# Patient Record
Sex: Female | Born: 1940 | Race: White | Hispanic: No | Smoking: Former smoker
Health system: Southern US, Community
[De-identification: ages and names within clinical notes are randomized; demographics above are authoritative.]

## PROBLEM LIST (undated history)

## (undated) DIAGNOSIS — M51369 Other intervertebral disc degeneration, lumbar region without mention of lumbar back pain or lower extremity pain: Secondary | ICD-10-CM

## (undated) DIAGNOSIS — N390 Urinary tract infection, site not specified: Secondary | ICD-10-CM

## (undated) DIAGNOSIS — F0280 Dementia in other diseases classified elsewhere without behavioral disturbance: Secondary | ICD-10-CM

## (undated) DIAGNOSIS — I10 Essential (primary) hypertension: Secondary | ICD-10-CM

## (undated) DIAGNOSIS — Z87898 Personal history of other specified conditions: Secondary | ICD-10-CM

## (undated) DIAGNOSIS — E785 Hyperlipidemia, unspecified: Secondary | ICD-10-CM

## (undated) DIAGNOSIS — M5136 Other intervertebral disc degeneration, lumbar region: Secondary | ICD-10-CM

## (undated) DIAGNOSIS — K219 Gastro-esophageal reflux disease without esophagitis: Secondary | ICD-10-CM

## (undated) DIAGNOSIS — U071 COVID-19: Secondary | ICD-10-CM

## (undated) DIAGNOSIS — F32A Depression, unspecified: Secondary | ICD-10-CM

## (undated) DIAGNOSIS — M545 Low back pain, unspecified: Secondary | ICD-10-CM

## (undated) DIAGNOSIS — F419 Anxiety disorder, unspecified: Secondary | ICD-10-CM

## (undated) DIAGNOSIS — I2510 Atherosclerotic heart disease of native coronary artery without angina pectoris: Secondary | ICD-10-CM

## (undated) DIAGNOSIS — F329 Major depressive disorder, single episode, unspecified: Secondary | ICD-10-CM

## (undated) DIAGNOSIS — I200 Unstable angina: Secondary | ICD-10-CM

## (undated) DIAGNOSIS — G8929 Other chronic pain: Secondary | ICD-10-CM

## (undated) DIAGNOSIS — G309 Alzheimer's disease, unspecified: Secondary | ICD-10-CM

## (undated) DIAGNOSIS — J449 Chronic obstructive pulmonary disease, unspecified: Secondary | ICD-10-CM

## (undated) DIAGNOSIS — M1990 Unspecified osteoarthritis, unspecified site: Secondary | ICD-10-CM

## (undated) HISTORY — PX: CATARACT EXTRACTION: SUR2

## (undated) HISTORY — PX: ABDOMINAL HYSTERECTOMY: SHX81

## (undated) HISTORY — PX: OTHER SURGICAL HISTORY: SHX169

## (undated) HISTORY — PX: BACK SURGERY: SHX140

## (undated) HISTORY — PX: CHOLECYSTECTOMY OPEN: SUR202

## (undated) HISTORY — DX: Personal history of other specified conditions: Z87.898

---

## 2004-07-21 ENCOUNTER — Emergency Department (HOSPITAL_COMMUNITY): Admission: EM | Admit: 2004-07-21 | Discharge: 2004-07-22 | Payer: Self-pay | Admitting: Emergency Medicine

## 2004-07-28 ENCOUNTER — Emergency Department (HOSPITAL_COMMUNITY): Admission: EM | Admit: 2004-07-28 | Discharge: 2004-07-28 | Payer: Self-pay | Admitting: Emergency Medicine

## 2004-09-15 ENCOUNTER — Ambulatory Visit (HOSPITAL_COMMUNITY): Admission: RE | Admit: 2004-09-15 | Discharge: 2004-09-15 | Payer: Self-pay | Admitting: Gastroenterology

## 2004-10-13 ENCOUNTER — Ambulatory Visit (HOSPITAL_COMMUNITY): Admission: RE | Admit: 2004-10-13 | Discharge: 2004-10-13 | Payer: Self-pay | Admitting: Gastroenterology

## 2009-06-24 ENCOUNTER — Ambulatory Visit (HOSPITAL_COMMUNITY): Admission: RE | Admit: 2009-06-24 | Discharge: 2009-06-24 | Payer: Self-pay | Admitting: Family Medicine

## 2009-08-02 ENCOUNTER — Emergency Department (HOSPITAL_COMMUNITY): Admission: EM | Admit: 2009-08-02 | Discharge: 2009-08-02 | Payer: Self-pay | Admitting: Emergency Medicine

## 2010-02-16 ENCOUNTER — Emergency Department (HOSPITAL_COMMUNITY): Admission: EM | Admit: 2010-02-16 | Discharge: 2010-02-16 | Payer: Self-pay | Admitting: Emergency Medicine

## 2010-03-02 ENCOUNTER — Ambulatory Visit (HOSPITAL_COMMUNITY): Admission: RE | Admit: 2010-03-02 | Discharge: 2010-03-02 | Payer: Self-pay | Admitting: Orthopaedic Surgery

## 2010-04-11 HISTORY — PX: POSTERIOR LUMBAR FUSION: SHX6036

## 2010-04-23 ENCOUNTER — Encounter: Admission: RE | Admit: 2010-04-23 | Discharge: 2010-04-23 | Payer: Self-pay | Admitting: Neurosurgery

## 2010-05-11 ENCOUNTER — Inpatient Hospital Stay (HOSPITAL_COMMUNITY): Admission: RE | Admit: 2010-05-11 | Discharge: 2010-05-17 | Payer: Self-pay | Admitting: Neurosurgery

## 2010-05-23 ENCOUNTER — Inpatient Hospital Stay (HOSPITAL_COMMUNITY): Admission: EM | Admit: 2010-05-23 | Discharge: 2010-05-26 | Payer: Self-pay | Admitting: Emergency Medicine

## 2010-05-25 ENCOUNTER — Ambulatory Visit: Payer: Self-pay | Admitting: Physical Medicine & Rehabilitation

## 2010-09-01 ENCOUNTER — Inpatient Hospital Stay (HOSPITAL_COMMUNITY): Admission: EM | Admit: 2010-09-01 | Discharge: 2010-09-03 | Payer: Self-pay | Admitting: Emergency Medicine

## 2010-09-01 ENCOUNTER — Ambulatory Visit: Payer: Self-pay | Admitting: Cardiology

## 2010-09-01 ENCOUNTER — Ambulatory Visit: Payer: Self-pay | Admitting: Internal Medicine

## 2010-09-03 ENCOUNTER — Encounter: Payer: Self-pay | Admitting: Internal Medicine

## 2010-09-03 DIAGNOSIS — R55 Syncope and collapse: Secondary | ICD-10-CM | POA: Insufficient documentation

## 2010-09-03 DIAGNOSIS — F329 Major depressive disorder, single episode, unspecified: Secondary | ICD-10-CM | POA: Insufficient documentation

## 2010-09-03 DIAGNOSIS — I10 Essential (primary) hypertension: Secondary | ICD-10-CM | POA: Insufficient documentation

## 2010-09-14 ENCOUNTER — Encounter: Payer: Self-pay | Admitting: Internal Medicine

## 2010-09-14 ENCOUNTER — Ambulatory Visit: Payer: Self-pay | Admitting: Internal Medicine

## 2010-09-14 DIAGNOSIS — M549 Dorsalgia, unspecified: Secondary | ICD-10-CM

## 2010-09-14 DIAGNOSIS — D3500 Benign neoplasm of unspecified adrenal gland: Secondary | ICD-10-CM

## 2010-09-14 DIAGNOSIS — G8929 Other chronic pain: Secondary | ICD-10-CM

## 2010-09-14 DIAGNOSIS — M5137 Other intervertebral disc degeneration, lumbosacral region: Secondary | ICD-10-CM

## 2010-09-30 ENCOUNTER — Encounter: Payer: Self-pay | Admitting: Licensed Clinical Social Worker

## 2010-09-30 ENCOUNTER — Ambulatory Visit: Payer: Self-pay | Admitting: Internal Medicine

## 2010-09-30 DIAGNOSIS — F17200 Nicotine dependence, unspecified, uncomplicated: Secondary | ICD-10-CM
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
## 2011-01-12 NOTE — Assessment & Plan Note (Signed)
Summary: SW/Smoking Cessation  30 minutes.  Smoking Cessation Counseling.   Melissa Baird has been smoking for fifty years and smokes less than a pack of cigarettes each day.  She is interested in quitting because of the smell and that smoking is no longer socially acceptable.   [name] and I discussed the various tips for smoking and discussed medications.  Her depression is a contraindication for Chantix and so I encouraged her to think about using the nicotine patches to help quit.   [name] will keep in touch with me when she is ready to come up with a quit date. She also suffers with high blood pressure, lumbar back issues (recent back surgery), and family problems.

## 2011-01-12 NOTE — Discharge Summary (Signed)
Summary: Hospital Discharge Update    Hospital Discharge Update:  Date of Admission: 09/01/2010 Date of Discharge: 09/03/2010  Brief Summary:  Pt was admitted and evaluated for episodic HTN (with sbp to the 200s), headaches, and dizziness spells Incidental finding of 4x3cm left adrenal adenoma prompted phaeochromocytoma workup. She was discharged in stable condition after 24hr urine was collected with plan to f/u at [facility] clinic w/ Dr. Cena Benton within 1-2wks for hospital f/u and discussion of lab results (as below).  Lab or other results pending at discharge:  Plasma free metanephrines, urine metanephrines and catecholamines  Labs needed at follow-up: Basic metabolic panel  Problem list changes:  Added new problem of HYPERTENSION (ICD-401.9) - Signed Added new problem of DEPRESSION (ICD-311) - Signed Added new problem of Hospitalized for  SYNCOPE (ICD-780.2) - Signed  Medication list changes:  Added new medication of ASPIRIN 81 MG TBEC (ASPIRIN) take by mouth daily - Signed Added new medication of PROZAC 20 MG CAPS (FLUOXETINE HCL) take by mouth daily - Signed Added new medication of DIBENZYLINE 10 MG CAPS (PHENOXYBENZAMINE HCL) take one tab by mouth two times a day - Signed Added new medication of ALENDRONATE SODIUM 70 MG TABS (ALENDRONATE SODIUM) take 1 tab by mouth weekly - Signed Added new medication of NORVASC 10 MG TABS (AMLODIPINE BESYLATE) take one tab by mouth daily - Signed Added new medication of DIOVAN 320 MG TABS (VALSARTAN) take one tab by mouth daily - Signed Added new medication of NAPROXEN 500 MG TABS (NAPROXEN) take one tab by mouth two times a day - Signed  The medication, problem, and allergy lists have been updated.  Please see the dictated discharge summary for details.  Discharge medications:  ASPIRIN 81 MG TBEC (ASPIRIN) take by mouth daily PROZAC 20 MG CAPS (FLUOXETINE HCL) take by mouth daily DIBENZYLINE 10 MG CAPS (PHENOXYBENZAMINE HCL) take one tab by mouth  two times a day ALENDRONATE SODIUM 70 MG TABS (ALENDRONATE SODIUM) take 1 tab by mouth weekly NORVASC 10 MG TABS (AMLODIPINE BESYLATE) take one tab by mouth daily DIOVAN 320 MG TABS (VALSARTAN) take one tab by mouth daily NAPROXEN 500 MG TABS (NAPROXEN) take one tab by mouth two times a day  Other patient instructions:  You can increase your phenoxybenzamine to 20mg  two times a day if you start getting dizzy from your high blood pressue, otherwise come to the ED or call the clinic.

## 2011-01-12 NOTE — Medication Information (Signed)
Summary: CONTROLLED MEDICATION TREATMENT  CONTROLLED MEDICATION TREATMENT   Imported By: Margie Billet 09/23/2010 15:41:30  _____________________________________________________________________  External Attachment:    Type:   Image     Comment:   External Document

## 2011-01-12 NOTE — Miscellaneous (Signed)
Summary: PATIENT CONSENT FORM  PATIENT CONSENT FORM   Imported By: Louretta Parma 09/16/2010 16:05:18  _____________________________________________________________________  External Attachment:    Type:   Image     Comment:   External Document

## 2011-01-12 NOTE — Miscellaneous (Signed)
Summary: ADVANCED HOME HEALTH CERTIFICATION  ADVANCED HOME HEALTH CERTIFICATION   Imported By: Shon Hough 09/17/2010 13:44:24  _____________________________________________________________________  External Attachment:    Type:   Image     Comment:   External Document
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
## 2011-01-19 ENCOUNTER — Other Ambulatory Visit: Payer: Self-pay | Admitting: *Deleted

## 2011-01-20 ENCOUNTER — Other Ambulatory Visit: Payer: Self-pay | Admitting: *Deleted

## 2011-01-21 MED ORDER — HYDROCHLOROTHIAZIDE 12.5 MG PO CAPS: 12.5000 mg | ORAL_CAPSULE | Freq: Every day | ORAL | Status: DC

## 2011-02-24 LAB — CBC
HCT: 44.4 % (ref 36.0–46.0)
Hemoglobin: 15.1 g/dL — ABNORMAL HIGH (ref 12.0–15.0)
Hemoglobin: 16.9 g/dL — ABNORMAL HIGH (ref 12.0–15.0)
MCH: 28.9 pg (ref 26.0–34.0)
RBC: 5.27 MIL/uL — ABNORMAL HIGH (ref 3.87–5.11)
RBC: 5.85 MIL/uL — ABNORMAL HIGH (ref 3.87–5.11)
WBC: 8.5 10*3/uL (ref 4.0–10.5)

## 2011-02-24 LAB — CK TOTAL AND CKMB (NOT AT ARMC)
CK, MB: 2.7 ng/mL (ref 0.3–4.0)
Relative Index: INVALID (ref 0.0–2.5)
Relative Index: INVALID (ref 0.0–2.5)

## 2011-02-24 LAB — URINALYSIS, ROUTINE W REFLEX MICROSCOPIC
Glucose, UA: NEGATIVE mg/dL
Nitrite: NEGATIVE
Specific Gravity, Urine: 1.004 — ABNORMAL LOW (ref 1.005–1.030)
pH: 7.5 (ref 5.0–8.0)

## 2011-02-24 LAB — CATECHOLAMINES, FRACTIONATED, URINE, 24 HOUR
Catecholamines T: 60 mcg/24 h (ref 26–121)
Creatinine, Urine mg/day-CATEUR: 1.04 g/(24.h) (ref 0.63–2.50)
Dopamine 24 Hr Urine: 159 mcg/24 h (ref 52–480)
Norepinephrine 24 Hr Urine: 60 mcg/24 h (ref 15–100)
Total urine volume: 2400 mL

## 2011-02-24 LAB — COMPREHENSIVE METABOLIC PANEL
ALT: 15 U/L (ref 0–35)
AST: 20 U/L (ref 0–37)
Albumin: 4.2 g/dL (ref 3.5–5.2)
CO2: 30 mEq/L (ref 19–32)
Calcium: 10.1 mg/dL (ref 8.4–10.5)
GFR calc Af Amer: >60 mL/min (ref >60)
GFR calc non Af Amer: >60 mL/min (ref >60)
Sodium: 143 mEq/L (ref 135–145)
Total Protein: 7.9 g/dL (ref 6.0–8.3)

## 2011-02-24 LAB — DIFFERENTIAL
Eosinophils Absolute: 0.1 10*3/uL (ref 0.0–0.7)
Eosinophils Relative: 1 % (ref 0–5)
Lymphocytes Relative: 27 % (ref 12–46)
Lymphs Abs: 2.3 10*3/uL (ref 0.7–4.0)
Monocytes Absolute: 0.6 10*3/uL (ref 0.1–1.0)
Monocytes Relative: 7 % (ref 3–12)

## 2011-02-24 LAB — METANEPHRINES, PLASMA: Total Metanephrines-Plasma: 85 pg/mL (ref <=205)

## 2011-02-24 LAB — LIPID PANEL
Cholesterol: 198 mg/dL (ref 0–200)
HDL: 39 mg/dL — ABNORMAL LOW (ref >39)
LDL Cholesterol: 134 mg/dL — ABNORMAL HIGH (ref 0–99)
Total CHOL/HDL Ratio: 5.1 RATIO
Triglycerides: 126 mg/dL (ref <150)
VLDL: 25 mg/dL (ref 0–40)

## 2011-02-24 LAB — MRSA PCR SCREENING: MRSA by PCR: NEGATIVE

## 2011-02-24 LAB — CARDIAC PANEL(CRET KIN+CKTOT+MB+TROPI)
CK, MB: 2.2 ng/mL (ref 0.3–4.0)
Relative Index: INVALID (ref 0.0–2.5)
Total CK: 62 U/L (ref 7–177)
Total CK: 63 U/L (ref 7–177)

## 2011-02-24 LAB — TROPONIN I
Troponin I: 0.01 ng/mL (ref 0.00–0.06)
Troponin I: 0.02 ng/mL (ref 0.00–0.06)

## 2011-02-24 LAB — BASIC METABOLIC PANEL
BUN: 10 mg/dL (ref 6–23)
BUN: 12 mg/dL (ref 6–23)
CO2: 27 mEq/L (ref 19–32)
Calcium: 9.3 mg/dL (ref 8.4–10.5)
Chloride: 101 mEq/L (ref 96–112)
Creatinine, Ser: 0.63 mg/dL (ref 0.4–1.2)
GFR calc Af Amer: >60 mL/min (ref >60)
GFR calc non Af Amer: >60 mL/min (ref >60)
GFR calc non Af Amer: >60 mL/min (ref >60)
Glucose, Bld: 98 mg/dL (ref 70–99)
Potassium: 3.8 mEq/L (ref 3.5–5.1)
Sodium: 140 mEq/L (ref 135–145)

## 2011-02-24 LAB — TSH: TSH: 0.57 u[IU]/mL (ref 0.350–4.500)

## 2011-02-24 LAB — RAPID URINE DRUG SCREEN, HOSP PERFORMED
Barbiturates: NOT DETECTED
Benzodiazepines: NOT DETECTED
Cocaine: NOT DETECTED

## 2011-02-28 LAB — CBC
HCT: 44.7 % (ref 36.0–46.0)
Hemoglobin: 11.8 g/dL — ABNORMAL LOW (ref 12.0–15.0)
MCHC: 34.5 g/dL (ref 30.0–36.0)
MCHC: 34.7 g/dL (ref 30.0–36.0)
MCV: 88 fL (ref 78.0–100.0)
Platelets: 327 10*3/uL (ref 150–400)
Platelets: 240 10*3/uL (ref 150–400)
RDW: 13.5 % (ref 11.5–15.5)
RDW: 13.5 % (ref 11.5–15.5)
WBC: 9.5 10*3/uL (ref 4.0–10.5)

## 2011-02-28 LAB — URINALYSIS, ROUTINE W REFLEX MICROSCOPIC
Ketones, ur: NEGATIVE mg/dL
Nitrite: POSITIVE — AB
Protein, ur: NEGATIVE mg/dL
Urobilinogen, UA: 2 mg/dL — ABNORMAL HIGH (ref 0.0–1.0)

## 2011-02-28 LAB — POCT CARDIAC MARKERS
Myoglobin, poc: 94.7 ng/mL (ref 12–200)
Troponin i, poc: <0.05 ng/mL (ref 0.00–0.09)

## 2011-02-28 LAB — TYPE AND SCREEN: Antibody Screen: NEGATIVE

## 2011-02-28 LAB — BASIC METABOLIC PANEL
BUN: 4 mg/dL — ABNORMAL LOW (ref 6–23)
BUN: 7 mg/dL (ref 6–23)
CO2: 32 mEq/L (ref 19–32)
CO2: 32 mEq/L (ref 19–32)
Calcium: 8.6 mg/dL (ref 8.4–10.5)
Chloride: 105 mEq/L (ref 96–112)
Glucose, Bld: 99 mg/dL (ref 70–99)
Glucose, Bld: 100 mg/dL — ABNORMAL HIGH (ref 70–99)
Potassium: 4.8 mEq/L (ref 3.5–5.1)
Sodium: 141 mEq/L (ref 135–145)

## 2011-02-28 LAB — DIFFERENTIAL
Eosinophils Relative: 2 % (ref 0–5)
Lymphocytes Relative: 27 % (ref 12–46)
Lymphs Abs: 2.7 10*3/uL (ref 0.7–4.0)
Monocytes Relative: 5 % (ref 3–12)
Neutrophils Relative %: 65 % (ref 43–77)

## 2011-04-19 ENCOUNTER — Other Ambulatory Visit: Payer: Self-pay | Admitting: Internal Medicine

## 2011-04-25 ENCOUNTER — Encounter: Payer: Self-pay | Admitting: Internal Medicine

## 2011-06-30 ENCOUNTER — Other Ambulatory Visit (HOSPITAL_COMMUNITY): Payer: Self-pay | Admitting: Family Medicine

## 2011-06-30 ENCOUNTER — Ambulatory Visit (HOSPITAL_COMMUNITY)
Admission: RE | Admit: 2011-06-30 | Discharge: 2011-06-30 | Disposition: A | Discharge status: Home / Self Care | Payer: PRIVATE HEALTH INSURANCE | Source: Ambulatory Visit | Attending: Family Medicine | Admitting: Family Medicine

## 2011-06-30 DIAGNOSIS — R0602 Shortness of breath: Secondary | ICD-10-CM | POA: Insufficient documentation

## 2011-06-30 DIAGNOSIS — I10 Essential (primary) hypertension: Secondary | ICD-10-CM | POA: Insufficient documentation

## 2011-06-30 DIAGNOSIS — T1490XA Injury, unspecified, initial encounter: Secondary | ICD-10-CM

## 2011-06-30 DIAGNOSIS — R0789 Other chest pain: Secondary | ICD-10-CM | POA: Insufficient documentation

## 2011-06-30 DIAGNOSIS — F17200 Nicotine dependence, unspecified, uncomplicated: Secondary | ICD-10-CM | POA: Insufficient documentation

## 2011-08-26 ENCOUNTER — Other Ambulatory Visit: Payer: Self-pay | Admitting: *Deleted

## 2011-08-26 MED ORDER — HYDROCHLOROTHIAZIDE 12.5 MG PO CAPS: 12.5000 mg | ORAL_CAPSULE | Freq: Every day | ORAL | Status: AC

## 2011-08-26 MED ORDER — NEBIVOLOL HCL 10 MG PO TABS: 5.0000 mg | ORAL_TABLET | Freq: Every day | ORAL | Status: DC

## 2011-08-26 NOTE — Telephone Encounter (Signed)
Long overdue for visit and labs.

## 2011-09-05 NOTE — Telephone Encounter (Signed)
Pt needs pcp visit and labs per dr Aundria Rud

## 2011-10-25 ENCOUNTER — Encounter: Payer: Medicare Other | Admitting: Internal Medicine

## 2012-12-20 ENCOUNTER — Ambulatory Visit (HOSPITAL_COMMUNITY)
Admission: RE | Admit: 2012-12-20 | Discharge: 2012-12-20 | Disposition: A | Discharge status: Home / Self Care | Payer: Medicare Other | Source: Ambulatory Visit | Attending: Family Medicine | Admitting: Family Medicine

## 2012-12-20 ENCOUNTER — Other Ambulatory Visit (HOSPITAL_COMMUNITY): Payer: Self-pay | Admitting: Family Medicine

## 2012-12-20 DIAGNOSIS — M545 Low back pain, unspecified: Secondary | ICD-10-CM | POA: Insufficient documentation

## 2012-12-20 DIAGNOSIS — Z981 Arthrodesis status: Secondary | ICD-10-CM

## 2012-12-20 DIAGNOSIS — M5137 Other intervertebral disc degeneration, lumbosacral region: Secondary | ICD-10-CM | POA: Insufficient documentation

## 2012-12-20 DIAGNOSIS — M51379 Other intervertebral disc degeneration, lumbosacral region without mention of lumbar back pain or lower extremity pain: Secondary | ICD-10-CM | POA: Insufficient documentation

## 2013-02-14 ENCOUNTER — Ambulatory Visit (HOSPITAL_COMMUNITY)
Admission: RE | Admit: 2013-02-14 | Discharge: 2013-02-14 | Disposition: A | Discharge status: Home / Self Care | Payer: Medicare Other | Source: Ambulatory Visit | Attending: Family Medicine | Admitting: Family Medicine

## 2013-02-14 ENCOUNTER — Other Ambulatory Visit (HOSPITAL_COMMUNITY): Payer: Self-pay | Admitting: Family Medicine

## 2013-02-14 DIAGNOSIS — M1610 Unilateral primary osteoarthritis, unspecified hip: Secondary | ICD-10-CM | POA: Insufficient documentation

## 2013-02-14 DIAGNOSIS — M25559 Pain in unspecified hip: Secondary | ICD-10-CM | POA: Insufficient documentation

## 2013-02-14 DIAGNOSIS — M1990 Unspecified osteoarthritis, unspecified site: Secondary | ICD-10-CM

## 2013-06-17 ENCOUNTER — Encounter (HOSPITAL_COMMUNITY): Payer: Self-pay | Admitting: *Deleted

## 2013-06-17 ENCOUNTER — Encounter (HOSPITAL_COMMUNITY)
Admission: EM | Disposition: A | Discharge status: Home / Self Care | Payer: Self-pay | Attending: Cardiovascular Disease

## 2013-06-17 ENCOUNTER — Emergency Department (HOSPITAL_COMMUNITY): Payer: Medicare Other

## 2013-06-17 ENCOUNTER — Inpatient Hospital Stay (HOSPITAL_COMMUNITY)
Admission: EM | Admit: 2013-06-17 | Discharge: 2013-06-19 | DRG: 287 | Disposition: A | Discharge status: Home / Self Care | Payer: Medicare Other | Source: Other Acute Inpatient Hospital | Attending: Cardiovascular Disease | Admitting: Cardiovascular Disease

## 2013-06-17 ENCOUNTER — Other Ambulatory Visit: Payer: Self-pay

## 2013-06-17 DIAGNOSIS — F3289 Other specified depressive episodes: Secondary | ICD-10-CM | POA: Diagnosis present

## 2013-06-17 DIAGNOSIS — I10 Essential (primary) hypertension: Secondary | ICD-10-CM | POA: Diagnosis present

## 2013-06-17 DIAGNOSIS — R55 Syncope and collapse: Secondary | ICD-10-CM | POA: Diagnosis present

## 2013-06-17 DIAGNOSIS — G8929 Other chronic pain: Secondary | ICD-10-CM | POA: Diagnosis present

## 2013-06-17 DIAGNOSIS — F17200 Nicotine dependence, unspecified, uncomplicated: Secondary | ICD-10-CM | POA: Diagnosis present

## 2013-06-17 DIAGNOSIS — I2510 Atherosclerotic heart disease of native coronary artery without angina pectoris: Principal | ICD-10-CM | POA: Diagnosis present

## 2013-06-17 DIAGNOSIS — F32A Depression, unspecified: Secondary | ICD-10-CM | POA: Diagnosis present

## 2013-06-17 DIAGNOSIS — E785 Hyperlipidemia, unspecified: Secondary | ICD-10-CM | POA: Diagnosis present

## 2013-06-17 DIAGNOSIS — I200 Unstable angina: Secondary | ICD-10-CM | POA: Diagnosis present

## 2013-06-17 DIAGNOSIS — F329 Major depressive disorder, single episode, unspecified: Secondary | ICD-10-CM | POA: Diagnosis present

## 2013-06-17 DIAGNOSIS — IMO0002 Reserved for concepts with insufficient information to code with codable children: Secondary | ICD-10-CM | POA: Diagnosis present

## 2013-06-17 LAB — CBC WITH DIFFERENTIAL/PLATELET
Eosinophils Relative: 1 % (ref 0–5)
Hemoglobin: 15.2 g/dL — ABNORMAL HIGH (ref 12.0–15.0)
Lymphocytes Relative: 20 % (ref 12–46)
Lymphs Abs: 3.1 10*3/uL (ref 0.7–4.0)
MCV: 84.6 fL (ref 78.0–100.0)
Platelets: 282 10*3/uL (ref 150–400)
RBC: 5.19 MIL/uL — ABNORMAL HIGH (ref 3.87–5.11)
WBC: 15.5 10*3/uL — ABNORMAL HIGH (ref 4.0–10.5)

## 2013-06-17 LAB — CBC
MCH: 28.5 pg (ref 26.0–34.0)
MCHC: 33.9 g/dL (ref 30.0–36.0)
Platelets: 279 10*3/uL (ref 150–400)
RBC: 4.78 MIL/uL (ref 3.87–5.11)

## 2013-06-17 LAB — POCT I-STAT, CHEM 8
Calcium, Ion: 1.19 mmol/L (ref 1.13–1.30)
HCT: 48 % — ABNORMAL HIGH (ref 36.0–46.0)
Hemoglobin: 16.3 g/dL — ABNORMAL HIGH (ref 12.0–15.0)
Sodium: 141 mEq/L (ref 135–145)
TCO2: 25 mmol/L (ref 0–100)

## 2013-06-17 LAB — POCT I-STAT TROPONIN I: Troponin i, poc: 0.01 ng/mL (ref 0.00–0.08)

## 2013-06-17 LAB — TROPONIN I: Troponin I: <0.3 ng/mL (ref <0.30)

## 2013-06-17 LAB — CREATININE, SERUM: Creatinine, Ser: 0.77 mg/dL (ref 0.50–1.10)

## 2013-06-17 SURGERY — LEFT HEART CATHETERIZATION WITH CORONARY ANGIOGRAM
Anesthesia: LOCAL

## 2013-06-17 MED ORDER — AMLODIPINE BESYLATE 10 MG PO TABS
10.0000 mg | ORAL_TABLET | Freq: Every day | ORAL | Status: DC
  Administered 2013-06-19: 10 mg via ORAL
  Filled 2013-06-17 (×2): qty 1

## 2013-06-17 MED ORDER — ASPIRIN 300 MG RE SUPP
300.0000 mg | RECTAL | Status: DC
  Filled 2013-06-17: qty 1

## 2013-06-17 MED ORDER — ALPRAZOLAM 0.25 MG PO TABS: 0.2500 mg | ORAL_TABLET | Freq: Two times a day (BID) | ORAL | Status: DC | PRN

## 2013-06-17 MED ORDER — IRBESARTAN 300 MG PO TABS
300.0000 mg | ORAL_TABLET | Freq: Every day | ORAL | Status: DC
  Filled 2013-06-17 (×2): qty 1

## 2013-06-17 MED ORDER — NITROGLYCERIN IN D5W 200-5 MCG/ML-% IV SOLN
2.0000 ug/min | INTRAVENOUS | Status: DC
  Administered 2013-06-17: 2 ug/min via INTRAVENOUS
  Filled 2013-06-17: qty 250

## 2013-06-17 MED ORDER — HEPARIN (PORCINE) IN NACL 100-0.45 UNIT/ML-% IJ SOLN
950.0000 [IU]/h | INTRAMUSCULAR | Status: DC
  Administered 2013-06-17: 950 [IU]/h via INTRAVENOUS
  Filled 2013-06-17 (×2): qty 250

## 2013-06-17 MED ORDER — OXYCODONE-ACETAMINOPHEN 7.5-500 MG PO TABS: 1.0000 | ORAL_TABLET | Freq: Three times a day (TID) | ORAL | Status: DC | PRN

## 2013-06-17 MED ORDER — ACETAMINOPHEN 325 MG PO TABS: 650.0000 mg | ORAL_TABLET | ORAL | Status: DC | PRN

## 2013-06-17 MED ORDER — ONDANSETRON HCL 4 MG/2ML IJ SOLN: 4.0000 mg | Freq: Four times a day (QID) | INTRAMUSCULAR | Status: DC | PRN

## 2013-06-17 MED ORDER — ASPIRIN EC 81 MG PO TBEC
81.0000 mg | DELAYED_RELEASE_TABLET | Freq: Every day | ORAL | Status: DC
  Administered 2013-06-19: 81 mg via ORAL
  Filled 2013-06-17 (×2): qty 1

## 2013-06-17 MED ORDER — ZOLPIDEM TARTRATE 5 MG PO TABS: 5.0000 mg | ORAL_TABLET | Freq: Every evening | ORAL | Status: DC | PRN

## 2013-06-17 MED ORDER — HEPARIN SODIUM (PORCINE) 5000 UNIT/ML IJ SOLN: 5000.0000 [IU] | Freq: Three times a day (TID) | INTRAMUSCULAR | Status: DC

## 2013-06-17 MED ORDER — OXYCODONE HCL 5 MG PO TABS: 2.5000 mg | ORAL_TABLET | Freq: Three times a day (TID) | ORAL | Status: DC | PRN

## 2013-06-17 MED ORDER — NITROGLYCERIN 0.4 MG SL SUBL: 0.4000 mg | SUBLINGUAL_TABLET | SUBLINGUAL | Status: DC | PRN

## 2013-06-17 MED ORDER — HEPARIN BOLUS VIA INFUSION
3500.0000 [IU] | Freq: Once | INTRAVENOUS | Status: AC
  Administered 2013-06-17: 3500 [IU] via INTRAVENOUS

## 2013-06-17 MED ORDER — OXYCODONE-ACETAMINOPHEN 5-325 MG PO TABS: 1.0000 | ORAL_TABLET | Freq: Three times a day (TID) | ORAL | Status: DC | PRN

## 2013-06-17 MED ORDER — ASPIRIN 81 MG PO CHEW: 324.0000 mg | CHEWABLE_TABLET | ORAL | Status: DC

## 2013-06-17 MED ORDER — PANTOPRAZOLE SODIUM 40 MG PO TBEC
40.0000 mg | DELAYED_RELEASE_TABLET | Freq: Every day | ORAL | Status: DC
  Administered 2013-06-19: 40 mg via ORAL
  Filled 2013-06-17: qty 1
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
## 2013-06-17 NOTE — Progress Notes (Signed)
ANTICOAGULATION CONSULT NOTE - Initial Consult  Pharmacy Consult for UFH Indication: USAP  No Known Allergies  Patient Measurements: Height: 5' 5.35" (166 cm) Weight: 148 lb 5.9 oz (67.3 kg) IBW/kg (Calculated) : 57.81 Heparin Dosing Weight: 67kg  Vital Signs: Temp: 97.6 F (36.4 C) (07/07 1517) Temp src: Oral (07/07 1517) BP: 156/77 mmHg (07/07 1830) Pulse Rate: 82 (07/07 1830)  Labs:  Recent Labs  06/17/13 1516 06/17/13 1533  HGB 15.2* 16.3*  HCT 43.9 48.0*  PLT 282  --   CREATININE  --  1.20*    Estimated Creatinine Clearance: 39.8 ml/min (by C-G formula based on Cr of 1.2).   Medical History: Past Medical History  Diagnosis Date  . Hypertension     Medications:   (Not in a hospital admission)  Assessment: 72 y/o female patient admitted with chest pain requiring anticoagulation for r/o MI. EKG wnl, and cardiac enzymes negative x1.  Goal of Therapy:  Heparin level 0.3-0.7 units/ml Monitor platelets by anticoagulation protocol: Yes   Plan:  Heparin 3500 unit IV bolus followed by infusion at 950 units/hr. Check 8 hour heparin level with daily cbc and heparin level.  Verlene Mayer, PharmD, BCPS Pager [phone]   Dickey Gave 06/17/2013,7:01 PM

## 2013-06-17 NOTE — ED Notes (Signed)
Patient states chest pain x approx 1 week, patient states episodes of diaphoresis and nausea associated.
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
## 2013-06-18 ENCOUNTER — Encounter (HOSPITAL_COMMUNITY)
Admission: EM | Disposition: A | Discharge status: Home / Self Care | Payer: Self-pay | Attending: Cardiovascular Disease

## 2013-06-18 DIAGNOSIS — I2510 Atherosclerotic heart disease of native coronary artery without angina pectoris: Secondary | ICD-10-CM

## 2013-06-18 HISTORY — PX: LEFT HEART CATHETERIZATION WITH CORONARY ANGIOGRAM: SHX5451

## 2013-06-18 LAB — CBC
MCV: 84.9 fL (ref 78.0–100.0)
Platelets: 257 10*3/uL (ref 150–400)
RDW: 14.5 % (ref 11.5–15.5)
WBC: 9.5 10*3/uL (ref 4.0–10.5)

## 2013-06-18 LAB — BASIC METABOLIC PANEL
BUN: 21 mg/dL (ref 6–23)
Calcium: 8.7 mg/dL (ref 8.4–10.5)
GFR calc Af Amer: >90 mL/min (ref >90)
GFR calc non Af Amer: 85 mL/min — ABNORMAL LOW (ref >90)
Glucose, Bld: 107 mg/dL — ABNORMAL HIGH (ref 70–99)
Sodium: 140 mEq/L (ref 135–145)

## 2013-06-18 LAB — LIPID PANEL
Total CHOL/HDL Ratio: 3.5 RATIO
VLDL: 28 mg/dL (ref 0–40)

## 2013-06-18 LAB — POCT ACTIVATED CLOTTING TIME: Activated Clotting Time: 104 seconds

## 2013-06-18 SURGERY — LEFT HEART CATHETERIZATION WITH CORONARY ANGIOGRAM
Anesthesia: LOCAL

## 2013-06-18 MED ORDER — SODIUM CHLORIDE 0.9 % IJ SOLN: 3.0000 mL | INTRAMUSCULAR | Status: DC | PRN

## 2013-06-18 MED ORDER — SODIUM CHLORIDE 0.9 % IJ SOLN: 3.0000 mL | Freq: Two times a day (BID) | INTRAMUSCULAR | Status: DC

## 2013-06-18 MED ORDER — LIDOCAINE HCL (PF) 1 % IJ SOLN
INTRAMUSCULAR | Status: AC
  Filled 2013-06-18: qty 30

## 2013-06-18 MED ORDER — FENTANYL CITRATE 0.05 MG/ML IJ SOLN
INTRAMUSCULAR | Status: AC
  Filled 2013-06-18: qty 2

## 2013-06-18 MED ORDER — MEMANTINE HCL 10 MG PO TABS
10.0000 mg | ORAL_TABLET | Freq: Two times a day (BID) | ORAL | Status: DC
  Administered 2013-06-18 – 2013-06-19 (×3): 10 mg via ORAL
  Filled 2013-06-18 (×4): qty 1

## 2013-06-18 MED ORDER — SODIUM CHLORIDE 0.9 % IV SOLN: INTRAVENOUS | Status: DC

## 2013-06-18 MED ORDER — SODIUM CHLORIDE 0.9 % IV SOLN: 250.0000 mL | INTRAVENOUS | Status: DC | PRN

## 2013-06-18 MED ORDER — ONDANSETRON HCL 4 MG/2ML IJ SOLN: 4.0000 mg | Freq: Four times a day (QID) | INTRAMUSCULAR | Status: DC | PRN

## 2013-06-18 MED ORDER — ACETAMINOPHEN 325 MG PO TABS: 650.0000 mg | ORAL_TABLET | ORAL | Status: DC | PRN

## 2013-06-18 MED ORDER — SODIUM CHLORIDE 0.9 % IV SOLN
INTRAVENOUS | Status: AC
  Administered 2013-06-18: 10:00:00 via INTRAVENOUS

## 2013-06-18 MED ORDER — LISINOPRIL 40 MG PO TABS
40.0000 mg | ORAL_TABLET | Freq: Every day | ORAL | Status: DC
  Administered 2013-06-18 – 2013-06-19 (×2): 40 mg via ORAL
  Filled 2013-06-18 (×2): qty 1

## 2013-06-18 MED ORDER — HEPARIN (PORCINE) IN NACL 2-0.9 UNIT/ML-% IJ SOLN
INTRAMUSCULAR | Status: AC
  Filled 2013-06-18: qty 1000

## 2013-06-18 MED ORDER — SODIUM CHLORIDE 0.9 % IV SOLN: 1.0000 mL/kg/h | INTRAVENOUS | Status: DC

## 2013-06-18 MED ORDER — ASPIRIN 81 MG PO CHEW: 81.0000 mg | CHEWABLE_TABLET | Freq: Every day | ORAL | Status: DC

## 2013-06-18 MED ORDER — MIDAZOLAM HCL 2 MG/2ML IJ SOLN
INTRAMUSCULAR | Status: AC
  Filled 2013-06-18: qty 2

## 2013-06-18 MED ORDER — METOPROLOL TARTRATE 25 MG PO TABS
25.0000 mg | ORAL_TABLET | Freq: Two times a day (BID) | ORAL | Status: DC
  Administered 2013-06-18 – 2013-06-19 (×3): 25 mg via ORAL
  Filled 2013-06-18 (×5): qty 1

## 2013-06-18 MED ORDER — ASPIRIN 81 MG PO CHEW
324.0000 mg | CHEWABLE_TABLET | ORAL | Status: AC
  Administered 2013-06-18: 324 mg via ORAL

## 2013-06-18 MED ORDER — VERAPAMIL HCL 2.5 MG/ML IV SOLN
INTRAVENOUS | Status: AC
  Filled 2013-06-18: qty 2

## 2013-06-18 MED ORDER — NITROGLYCERIN 0.2 MG/ML ON CALL CATH LAB
INTRAVENOUS | Status: AC
  Filled 2013-06-18: qty 1

## 2013-06-18 MED ORDER — ASPIRIN 81 MG PO CHEW
324.0000 mg | CHEWABLE_TABLET | ORAL | Status: DC
  Filled 2013-06-18: qty 4

## 2013-06-18 MED ORDER — BUPROPION HCL ER (XL) 300 MG PO TB24
300.0000 mg | ORAL_TABLET | ORAL | Status: DC
  Administered 2013-06-18 – 2013-06-19 (×2): 300 mg via ORAL
  Filled 2013-06-18 (×4): qty 1

## 2013-06-18 NOTE — CV Procedure (Signed)
Melissa Baird is a 72 y.o. female    [id number] LOCATION:  FACILITY: [facility]  PHYSICIAN: Nanetta Batty, M.D. 11-09-1941   DATE OF PROCEDURE:  06/18/2013  DATE OF DISCHARGE:   CARDIAC CATHETERIZATION     History obtained from chart review.The patient is a 72 y/o Caucasian female with a history of HTN, HLD, and long standing history of tobacco abuse, who was transported urgently to [facility], via EMS, with a complaint of progressively worsening chest pain. The pain started roughly 7 days ago. It is substernal, mostly exertional and characterized as pressure-like pain. Today, she had radiation to the right upper jaw. No arm or back pain. No SOB. Today she developed severe 10/10 resting pain with associated nausea and profuse diaphoresis. She felt lightheaded and had a witnessed syncopal episode. She was unresponsive for 5-10 seconds. This was witnessed by her daughter. The patient was sitting in a chair and did not fall. At that time EMS was called. On arrival, she was given ASA and SL NGT, with relived the pain. She is currently chest pain free. She ruled out for myocardial infarction by enzymes. She remained pain-free on IV heparin and nitroglycerin. She presents now for cardiac catheterization to define her anatomy and rule out an ischemic etiology.    PROCEDURE DESCRIPTION:    The patient was brought to the second floor [facility] cath lab in the postabsorptive state. She was premedicated with Valium 5 mg by mouth, IV Versed and fentanyl. Her right wrist and groinWere prepped and shaved in usual sterile fashion. Xylocaine 1% was used for local anesthesia. A 6 French sheath was inserted into the right radial artery using standard Seldinger technique. Attempts were made to advance the wire and catheter of the radial artery however there was diffuse spasm in this artery making it impossible to advance the wire and catheter and therefore the right femoral artery was accessed using a 5 French  sheath. 5 French right and left Judkins diagnostic catheters and 5 French pigtail catheter was used for selective coronary angiography and left ventriculography respectively. Visipaque dye was used for the entirety of the case. Retrograde aorta, left ventricular end pullback pressures were recorded.   HEMODYNAMICS:    AO SYSTOLIC/AO DIASTOLIC: 152/65   LV SYSTOLIC/LV DIASTOLIC: 146/17  ANGIOGRAPHIC RESULTS:   1. Left main; normal  2. LAD; 40-50% segmental mid 3. Left circumflex; 40-50% AV groove circumflex proximal and 50% mid.  4. Right coronary artery; 50% segmental mid, 56% fairly focal distal 5. Left ventriculography; RAO left ventriculogram was performed using 25 cc of Visipaque dye at 12 cc per second. 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 She'll need cardiac risk factor modification and medical therapy. The radial sheath was removed and a TR band was placed on the right wrist to achieve hemostasis. The femoral sheath was removed and pressure was held on the right groin to achieve hemostasis. The patient will be hydrated and discharged home in the morning.  Runell Gess MD, [facility] 06/18/2013 9:49 AM

## 2013-06-18 NOTE — Plan of Care (Signed)
Problem: Consults Goal: Cardiac Cath Patient Education (See Patient Education module for education specifics.)  Outcome: Completed/Met Date Met:  06/18/13 Pt states she has had procedure in the past and understands. Refuses to watch video

## 2013-06-18 NOTE — Progress Notes (Signed)
ANTICOAGULATION CONSULT NOTE - Follow Up Consult  Pharmacy Consult for heparin Indication: USAP  Labs:  Recent Labs  06/17/13 1516 06/17/13 1533 06/17/13 2055 06/17/13 2107 06/18/13 0430  HGB 15.2* 16.3*  --  13.6 12.5  HCT 43.9 48.0*  --  40.1 37.7  PLT 282  --   --  279 257  HEPARINUNFRC  --   --   --   --  0.38  CREATININE  --  1.20*  --  0.77  --   TROPONINI  --   --  <0.30  --  <0.30    Assessment/Plan:  72yo female therapeutic on heparin with initial dosing for USAP.  Will continue gtt at current rate and confirm stable with next CE.  Vernard Gambles, PharmD, BCPS  06/18/2013,5:41 AM

## 2013-06-18 NOTE — Care Management Note (Signed)
    Page 1 of 1   06/18/2013     9:39:16 AM   CARE MANAGEMENT NOTE 06/18/2013  Patient:  Melissa Baird, Melissa Baird   Account Number:  [id number]  Date Initiated:  06/18/2013  Documentation initiated by:  Junius Creamer  Subjective/Objective Assessment:   adm w angina     Action/Plan:   lives w fam, pcp dr Gerlene Burdock [facility]   Anticipated DC Date:     Anticipated DC Plan:        DC Planning Services  CM consult      Choice offered to / List presented to:             Status of service:   Medicare Important Message given?   (If response is "NO", the following Medicare IM given date fields will be blank) Date Medicare IM given:   Date Additional Medicare IM given:    Discharge Disposition:    Per UR Regulation:  Reviewed for med. necessity/level of care/duration of stay  If discussed at Long Length of Stay Meetings, dates discussed:    Comments:

## 2013-06-18 NOTE — Progress Notes (Signed)
[facility]  Subjective: No further chest pain.   Objective: Vital signs in last 24 hours: Temp:  [97.6 F (36.4 C)-98.5 F (36.9 C)] 98.5 F (36.9 C) (07/08 0344) Pulse Rate:  [63-89] 82 (07/07 1915) Resp:  [14-25] 14 (07/08 0400) BP: (103-187)/(45-90) 155/59 mmHg (07/08 0400) SpO2:  [94 %-100 %] 96 % (07/08 0400) Weight:  [148 lb 5.9 oz (67.3 kg)-153 lb (69.4 kg)] 153 lb (69.4 kg) (07/08 0549)    Intake/Output from previous day: 07/07 0701 - 07/08 0700 In: 509.2 [P.O.:300; I.V.:209.2] Out: -  Intake/Output this shift:    Medications Current Facility-Administered Medications  Medication Dose Route Frequency Provider Last Rate Last Dose  . [START ON 06/19/2013] 0.9 %  sodium chloride infusion   Intravenous Continuous Brittainy Simmons, PA-C      . 0.9 %  sodium chloride infusion  250 mL Intravenous PRN Runell Gess, MD      . 0.9 %  sodium chloride infusion  1 mL/kg/hr Intravenous Continuous Runell Gess, MD 67.3 mL/hr at 06/18/13 0600 1 mL/kg/hr at 06/18/13 0600  . acetaminophen (TYLENOL) tablet 650 mg  650 mg Oral Q4H PRN Brittainy Simmons, PA-C      . ALPRAZolam (XANAX) tablet 0.25 mg  0.25 mg Oral BID PRN Brittainy Simmons, PA-C      . amLODipine (NORVASC) tablet 10 mg  10 mg Oral Daily Brittainy Simmons, PA-C      . [START ON 06/19/2013] aspirin chewable tablet 324 mg  324 mg Oral Pre-Cath Brittainy Simmons, PA-C      . aspirin EC tablet 81 mg  81 mg Oral Daily Brittainy Simmons, PA-C      . heparin ADULT infusion 100 units/mL (25000 units/250 mL)  950 Units/hr Intravenous Continuous Doug Sou, MD 9.5 mL/hr at 06/17/13 2015 950 Units/hr at 06/17/13 2015  . irbesartan (AVAPRO) tablet 300 mg  300 mg Oral Daily Brittainy Simmons, PA-C      . nitroGLYCERIN (NITROSTAT) SL tablet 0.4 mg  0.4 mg Sublingual Q5 Min x 3 PRN Brittainy Simmons, PA-C      . nitroGLYCERIN 0.2 mg/mL in dextrose 5 % infusion  2-200 mcg/min Intravenous Titrated  Brittainy Simmons, PA-C 1.5 mL/hr at 06/17/13 2030 5 mcg/min at 06/17/13 2030  . ondansetron (ZOFRAN) injection 4 mg  4 mg Intravenous Q6H PRN Brittainy Simmons, PA-C      . oxyCODONE-acetaminophen (PERCOCET/ROXICET) 5-325 MG per tablet 1 tablet  1 tablet Oral Q8H PRN Runell Gess, MD       And  . oxyCODONE (Oxy IR/ROXICODONE) immediate release tablet 2.5 mg  2.5 mg Oral Q8H PRN Runell Gess, MD      . pantoprazole (PROTONIX) EC tablet 40 mg  40 mg Oral Daily Brittainy Simmons, PA-C      . sodium chloride 0.9 % injection 3 mL  3 mL Intravenous Q12H Runell Gess, MD      . sodium chloride 0.9 % injection 3 mL  3 mL Intravenous PRN Runell Gess, MD      . zolpidem (AMBIEN) tablet 5 mg  5 mg Oral QHS PRN Robbie Lis, PA-C        PE: General appearance: alert, cooperative and no distress Lungs: clear to auscultation bilaterally Heart: regular rate and rhythm Extremities: no LEE Pulses: 2+ and symmetric Skin: warm and dry Neurologic: Grossly normal  Lab Results:   Recent Labs  06/17/13 1516 06/17/13 1533 06/17/13 2107 06/18/13 0430  WBC 15.5*  --  12.4* 9.5  HGB 15.2* 16.3* 13.6 12.5  HCT 43.9 48.0* 40.1 37.7  PLT 282  --  279 257   BMET  Recent Labs  06/17/13 1533 06/17/13 2107 06/18/13 0430  NA 141  --  140  K 4.3  --  3.8  CL 105  --  107  CO2  --   --  24  GLUCOSE 112*  --  107*  BUN 23  --  21  CREATININE 1.20* 0.77 0.72  CALCIUM  --   --  8.7   PT/INR  Recent Labs  06/18/13 0430  LABPROT 14.0  INR 1.10   Cholesterol  Recent Labs  06/18/13 0430  CHOL 123   Cardiac Panel (last 3 results)  Recent Labs  06/17/13 2055 06/18/13 0430  TROPONINI <0.30 <0.30   Lipid Panel     Component Value Date/Time   CHOL 123 06/18/2013 0430   TRIG 141 06/18/2013 0430   HDL 35* 06/18/2013 0430   CHOLHDL 3.5 06/18/2013 0430   VLDL 28 06/18/2013 0430   LDLCALC 60 06/18/2013 0430     Assessment/Plan  Principal Problem:   Unstable angina Active  Problems:   TOBACCO ABUSE   HYPERTENSION   SYNCOPE   HLD (hyperlipidemia)  Plan: Enzymes negative x 2. Today's EKG is unchanged from initial EKG. No further chest pain. On IV heparin and IV nitro. HR is stable. Moderate hypertension. Currently on amlodipine and an ARB. Hold ARB in anticipation of cath today. ? Adding a low does BB. INR is WNL. Renal function is normal with a SCr of 0.72. She has been NPO since midnight. Plan for diagnostic LHC +/- PCI with Dr. Allyson Sabal today. Pt also has  2 blisters on the dorsal surface of left foot. The overlying skin is still intact. Will place a nursing wound care order to check daily and to provide appropriate care to prevent infection.     LOS: 1 day    Brittainy M. Delmer Islam 06/18/2013 7:53 AM  Agree with note written by Boyce Medici  PAC  No further CP. Enz neg. On IV hep/NTG. For diagnostic cath today.   Runell Gess 06/18/2013 8:56 AM

## 2013-06-18 NOTE — H&P (Signed)
    Pt was reexamined and existing H & P reviewed. No changes found.  Runell Gess, MD [facility] 06/18/2013 8:55 AM

## 2013-06-18 NOTE — Progress Notes (Signed)
Nutrition Brief Note  Patient identified on the Malnutrition Screening Tool (MST) Report  Body mass index is 25.19 kg/(m^2). Patient meets criteria for overweight based on current BMI.   Current diet order is Heart Healthy, patient is consuming approximately 75% of meals at this time per her report. Labs and medications reviewed.   Reviewed wt hx with pt who reports her usual wt is 150-160 lbs.  Pt reports her appetite is sometimes variable, however she acknowledges adequate intake. No nutrition interventions warranted at this time. If nutrition issues arise, please consult RD.   Loyce Dys, MS RD LDN Clinical Inpatient Dietitian Pager: [phone] Weekend/After hours pager: [phone]

## 2013-06-19 LAB — BASIC METABOLIC PANEL
BUN: 14 mg/dL (ref 6–23)
Calcium: 9.2 mg/dL (ref 8.4–10.5)
Creatinine, Ser: 0.76 mg/dL (ref 0.50–1.10)
GFR calc Af Amer: >90 mL/min (ref >90)
GFR calc non Af Amer: 83 mL/min — ABNORMAL LOW (ref >90)
Potassium: 3.8 mEq/L (ref 3.5–5.1)

## 2013-06-19 LAB — CBC
Hemoglobin: 13.1 g/dL (ref 12.0–15.0)
MCHC: 34.1 g/dL (ref 30.0–36.0)
Platelets: 248 10*3/uL (ref 150–400)
RDW: 14.3 % (ref 11.5–15.5)

## 2013-06-19 MED ORDER — ASPIRIN 81 MG PO CHEW: 81.0000 mg | CHEWABLE_TABLET | Freq: Every day | ORAL | Status: DC

## 2013-06-19 MED ORDER — METOPROLOL TARTRATE 50 MG PO TABS
50.0000 mg | ORAL_TABLET | Freq: Two times a day (BID) | ORAL | Status: DC
Start: 2013-06-19 — End: 2013-06-19
  Filled 2013-06-19: qty 1

## 2013-06-19 MED ORDER — NITROGLYCERIN 0.4 MG SL SUBL: 0.4000 mg | SUBLINGUAL_TABLET | SUBLINGUAL | Status: DC | PRN

## 2013-06-19 MED ORDER — METOPROLOL TARTRATE 50 MG PO TABS: 50.0000 mg | ORAL_TABLET | Freq: Two times a day (BID) | ORAL | Status: DC

## 2013-06-19 NOTE — Progress Notes (Signed)
[facility]  Subjective: No further chest pain. No groin or flank pain. She has chronic back pain, but no change from baseline. Hasn't been walking around much.   Objective: Vital signs in last 24 hours: Temp:  [98.4 F (36.9 C)-99.2 F (37.3 C)] 98.6 F (37 C) (07/09 0000) Pulse Rate:  [85-89] 89 (07/08 2158) Resp:  [15-22] 20 (07/09 0000) BP: (143-193)/(52-91) 193/86 mmHg (07/09 0700) SpO2:  [91 %-97 %] 94 % (07/09 0000)    Intake/Output from previous day: 07/08 0701 - 07/09 0700 In: 526.3 [I.V.:526.3] Out: 850 [Urine:850] Intake/Output this shift:    Medications Current Facility-Administered Medications  Medication Dose Route Frequency Provider Last Rate Last Dose  . acetaminophen (TYLENOL) tablet 650 mg  650 mg Oral Q4H PRN Brittainy Simmons, PA-C      . ALPRAZolam (XANAX) tablet 0.25 mg  0.25 mg Oral BID PRN Brittainy Simmons, PA-C      . amLODipine (NORVASC) tablet 10 mg  10 mg Oral Daily Brittainy Simmons, PA-C      . aspirin chewable tablet 81 mg  81 mg Oral Daily Runell Gess, MD      . aspirin EC tablet 81 mg  81 mg Oral Daily Brittainy Simmons, PA-C      . buPROPion (WELLBUTRIN XL) 24 hr tablet 300 mg  300 mg Oral BH-q7a Runell Gess, MD   300 mg at 06/18/13 1400  . irbesartan (AVAPRO) tablet 300 mg  300 mg Oral Daily Brittainy Simmons, PA-C      . lisinopril (PRINIVIL,ZESTRIL) tablet 40 mg  40 mg Oral Daily Runell Gess, MD   40 mg at 06/18/13 1400  . memantine (NAMENDA) tablet 10 mg  10 mg Oral BID Runell Gess, MD   10 mg at 06/18/13 2158  . metoprolol tartrate (LOPRESSOR) tablet 25 mg  25 mg Oral BID Runell Gess, MD   25 mg at 06/18/13 2158  . nitroGLYCERIN (NITROSTAT) SL tablet 0.4 mg  0.4 mg Sublingual Q5 Min x 3 PRN Brittainy Simmons, PA-C      . ondansetron (ZOFRAN) injection 4 mg  4 mg Intravenous Q6H PRN Brittainy Simmons, PA-C      . oxyCODONE-acetaminophen (PERCOCET/ROXICET) 5-325 MG per tablet 1 tablet   1 tablet Oral Q8H PRN Runell Gess, MD       And  . oxyCODONE (Oxy IR/ROXICODONE) immediate release tablet 2.5 mg  2.5 mg Oral Q8H PRN Runell Gess, MD      . pantoprazole (PROTONIX) EC tablet 40 mg  40 mg Oral Daily Brittainy Simmons, PA-C      . zolpidem (AMBIEN) tablet 5 mg  5 mg Oral QHS PRN Robbie Lis, PA-C        PE: General appearance: alert, cooperative and no distress Lungs: clear to auscultation bilaterally Heart: regular rate and rhythm Extremities: no LEE Pulses: 2+ and symmetric Skin: warm and dry Neurologic: Grossly normal  Lab Results:   Recent Labs  06/17/13 2107 06/18/13 0430 06/19/13 0415  WBC 12.4* 9.5 10.9*  HGB 13.6 12.5 13.1  HCT 40.1 37.7 38.4  PLT 279 257 248   BMET  Recent Labs  06/17/13 1533 06/17/13 2107 06/18/13 0430 06/19/13 0415  NA 141  --  140 137  K 4.3  --  3.8 3.8  CL 105  --  107 102  CO2  --   --  24 27  GLUCOSE 112*  --  107* 89  BUN 23  --  21 14  CREATININE 1.20* 0.77 0.72 0.76  CALCIUM  --   --  8.7 9.2   PT/INR  Recent Labs  06/18/13 0430  LABPROT 14.0  INR 1.10   Cholesterol  Recent Labs  06/18/13 0430  CHOL 123   Cardiac Enzymes Cardiac Panel (last 3 results)  Recent Labs  06/17/13 2055 06/18/13 0430  TROPONINI <0.30 <0.30   Lipid Panel     Component Value Date/Time   CHOL 123 06/18/2013 0430   TRIG 141 06/18/2013 0430   HDL 35* 06/18/2013 0430   CHOLHDL 3.5 06/18/2013 0430   VLDL 28 06/18/2013 0430   LDLCALC 60 06/18/2013 0430    Studies/Results:  LHC 06/18/13 HEMODYNAMICS:  AO SYSTOLIC/AO DIASTOLIC: 152/65  LV SYSTOLIC/LV DIASTOLIC: 146/17  ANGIOGRAPHIC RESULTS:  1. Left main; normal  2. LAD; 40-50% segmental mid  3. Left circumflex; 40-50% AV groove circumflex proximal and 50% mid.  4. Right coronary artery; 50% segmental mid, 56% fairly focal distal  5. Left ventriculography; RAO left ventriculogram was performed using 25 cc of Visipaque dye at 12 cc per second. The LVEF was  estimated at 70%  Without wall motion abnormalities  Assessment/Plan  Principal Problem:   Unstable angina Active Problems:   TOBACCO ABUSE   DEPRESSION   HYPERTENSION   BACK PAIN, CHRONIC- Lspine surg 2011   SYNCOPE   HLD (hyperlipidemia)  Plan: No further chest pain. 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 Dr. Allyson Sabal is recommending cardiac risk factor modification and medical therapy. She is on ASA, a BB, an ACE + ARB ( ? Discontinuing ARB). ? Placing on statin therapy. Pt is a smoker and expresses desire to discontinue use now. Welbutrin has been started. She is hypertensive today with a SBP of 193. Amlodipine and Lopressor has just been given by RN. Will get lisinopril soon. If not controlled, may need to up titrate antihypertensives. The right groin is stable. Ambulate today. If BP is better controlled and no other issues, may be able to discharge home today.     LOS: 2 days    Brittainy M. Delmer Islam 06/19/2013 7:47 AM   Patient seen and examined. Agree with assessment and plan. No recurrent chest pain. Discussed smoking cessation. Will titrate lopressor to 50 mg bid. Would DC ARB, continue ACE-I, if BP continues to be difficult to control consider aldosterone blockade. ? DC later today if BP stable. Consider NMR lipid assessment as outpatient to maximize lipid lowering therapy.   Lennette Bihari, MD, [facility] 06/19/2013 8:20 AM

## 2013-06-19 NOTE — Progress Notes (Signed)
Pt discharged to home at this time. All medications and discharge instructions reviewed. All questions answered. Pt d/c to home with daughter and all personal belongings. Escorted out to vehicle via wheelchair. Denies pain or discomfort.

## 2013-06-19 NOTE — Discharge Summary (Signed)
Physician Discharge Summary  Patient ID: Melissa Baird MRN: [id number] DOB/AGE: May 10, 1941 72 y.o.  Admit date: 06/17/2013 Discharge date: 06/19/2013  Admission Diagnoses: Unstable Angina  Discharge Diagnoses:  Principal Problem:   Unstable angina Active Problems:   TOBACCO ABUSE   DEPRESSION   HYPERTENSION   BACK PAIN, CHRONIC- Lspine surg 2011   SYNCOPE   HLD (hyperlipidemia)   Discharged Condition: stable  Hospital Course: The patient is a 72 y/o Caucasian female with a history of HTN, HLD, and long standing history of tobacco abuse, who was transported urgently to [facility], via EMS on 06/17/13, with a complaint of progressively worsening chest pain, concerning for unstable angina. The pain was characterized as substernal, pressure-like, exertional pain, with radiation to the right jaw, with associated SOB, diaphoresis, nausea and presyncope. It resolved with SL NTG and morphine. Her EKG in the ER was normal and without acute change. She was admitted for observation and MI rule-out. Serial enzymes were cycled and were negative x 3. However, due to her risk factors and symptomatology, a diagnostic LHC was recommended. The patient consented. The procedure was performed by Dr. Allyson Sabal, via the right femoral artery. It revealed moderate, but not critical CAD (findings listed below). She had normal systolic function with an EF of 70%. Medical therapy was recommended. She left the cath lab in stable condition.  She had no complications. Her right groin remained stable. She was started on ASA, a BB, an ACE-I and a statin. She was also concealed on the importance of smoking cessation and started on Wellbutrin. She had no further chest pain. She was last seen and examined by Dr. Tresa Endo, who determined that she was stable for discharge home. She will follow up with Corine Shelter, PA-C, on 07/03/13.  Consults: None  Significant Diagnostic Studies:   LHC 06/18/13 HEMODYNAMICS:  AO SYSTOLIC/AO DIASTOLIC:  152/65  LV SYSTOLIC/LV DIASTOLIC: 146/17  ANGIOGRAPHIC RESULTS:  1. Left main; normal  2. LAD; 40-50% segmental mid  3. Left circumflex; 40-50% AV groove circumflex proximal and 50% mid.  4. Right coronary artery; 50% segmental mid, 56% fairly focal distal  5. Left ventriculography; RAO left ventriculogram was performed using 25 cc of Visipaque dye at 12 cc per second. The LVEF was estimated at 70%  Without wall motion abnormalities   Treatments: See Hospital Course  Discharge Exam: Blood pressure 138/68, pulse 89, temperature 98.1 F (36.7 C), temperature source Oral, resp. rate 18, height 5' 5.35" (1.66 m), weight 153 lb (69.4 kg), SpO2 94.00%.  Disposition:   Discharge Orders   Future Appointments Provider Department Dept Phone   07/03/2013 10:20 AM Abelino Derrick, PA-C [facility] [place] [phone]   Future Orders Complete By Expires     Diet - low sodium heart healthy  As directed     Driving Restrictions  As directed     Comments:      No driving for 3 days    Increase activity slowly  As directed     Lifting restrictions  As directed     Comments:      No lifting more than 1/2 gallon of milk for 3 days        Medication List         amLODipine 10 MG tablet  Commonly known as:  NORVASC  Take 10 mg by mouth daily.     aspirin 81 MG chewable tablet  Chew 1 tablet (81 mg total) by mouth daily.  buPROPion 300 MG 24 hr tablet  Commonly known as:  WELLBUTRIN XL  Take 300 mg by mouth every morning.     lisinopril 40 MG tablet  Commonly known as:  PRINIVIL,ZESTRIL  Take 40 mg by mouth daily.     memantine 10 MG tablet  Commonly known as:  NAMENDA  Take 10 mg by mouth 2 (two) times daily.     metoprolol 50 MG tablet  Commonly known as:  LOPRESSOR  Take 1 tablet (50 mg total) by mouth 2 (two) times daily.     naproxen 500 MG tablet  Commonly known as:  NAPROSYN  Take 500 mg by mouth 2 (two) times daily with a meal.      nitroGLYCERIN 0.4 MG SL tablet  Commonly known as:  NITROSTAT  Place 1 tablet (0.4 mg total) under the tongue every 5 (five) minutes x 3 doses as needed for chest pain.     omeprazole 20 MG capsule  Commonly known as:  PRILOSEC  Take 20 mg by mouth daily.     pravastatin 40 MG tablet  Commonly known as:  PRAVACHOL  Take 40 mg by mouth every evening.           Follow-up Information   Follow up with Abelino Derrick, PA-C On 07/03/2013. (10:20 am)    Contact information:   [address] [place] [address] [phone]      TIME SPENT ON DISCHARGE, INCLUDING PHYSICIAN TIME: >30 MINUTES  Signed: Allayne Butcher, PA-C 06/19/2013, 1:37 PM

## 2013-06-19 NOTE — Progress Notes (Signed)
Assumed care at this time. Pt resting in bed. No s/sx of distress noted. Vitals and assessment as documented.

## 2013-07-03 ENCOUNTER — Ambulatory Visit: Payer: Medicare Other | Admitting: Cardiology

## 2013-07-09 ENCOUNTER — Ambulatory Visit: Payer: Medicare Other | Admitting: Cardiology

## 2013-07-09 ENCOUNTER — Encounter: Payer: Self-pay | Admitting: Physician Assistant

## 2013-07-09 ENCOUNTER — Ambulatory Visit (INDEPENDENT_AMBULATORY_CARE_PROVIDER_SITE_OTHER): Payer: Medicare Other | Admitting: Physician Assistant

## 2013-07-09 VITALS — BP 142/82 | HR 60 | Ht 65.5 in | Wt 150.7 lb

## 2013-07-09 DIAGNOSIS — R079 Chest pain, unspecified: Secondary | ICD-10-CM

## 2013-07-09 DIAGNOSIS — I10 Essential (primary) hypertension: Secondary | ICD-10-CM

## 2013-07-09 DIAGNOSIS — I200 Unstable angina: Secondary | ICD-10-CM

## 2013-07-09 DIAGNOSIS — E785 Hyperlipidemia, unspecified: Secondary | ICD-10-CM

## 2013-07-09 MED ORDER — ISOSORBIDE MONONITRATE ER 30 MG PO TB24: 30.0000 mg | ORAL_TABLET | Freq: Every day | ORAL | Status: DC

## 2013-07-09 NOTE — Assessment & Plan Note (Addendum)
Treated with pravastatin

## 2013-07-09 NOTE — Assessment & Plan Note (Signed)
One episode of chest pain associated nausea and diaphoresis while taking a bath. We'll start imdur 30 mg.

## 2013-07-09 NOTE — Progress Notes (Signed)
Date:  07/09/2013   ID:  Melissa Baird, DOB 1941-03-25, MRN [id number]  PCP:  Isabella Stalling, MD  Primary Cardiologist:  Allyson Sabal     History of Present Illness: Melissa Baird is a 72 y.o. female with a history of HTN, HLD, and long standing history of tobacco abuse, who was transported urgently to [facility], via EMS on 06/17/13, with a complaint of progressively worsening chest pain, concerning for unstable angina. The pain was characterized as substernal, pressure-like, exertional pain, with radiation to the right jaw, with associated SOB, diaphoresis, nausea and presyncope. It resolved with SL NTG and morphine. Her EKG in the ER was normal and without acute change. She was admitted for observation and MI rule-out. Serial enzymes were cycled and were negative x 3. However, due to her risk factors and symptomatology, a diagnostic LHC was performed by Dr. Allyson Sabal, via the right femoral artery. It revealed moderate, but not critical CAD. She had normal systolic function with an EF of 70%. Medical therapy was recommended.  She was started on ASA, a BB, an ACE-I and a statin. She was also counseled on the importance of smoking cessation and started on Wellbutrin.  Patient presents today for followup.  This report one episode of chest pain she she rated as 7/10 he was taking a bath at that time. Pressure-like and relieved with one nitroglycerin sublingual. Her heart was elevated and her blood pressure is 160/82 at the time. She had associated diaphoresis, shortness of breath and nausea. She otherwise denies nausea, vomiting, fever, recurrent chest pain, shortness of breath, orthopnea, dizziness, PND, cough, congestion, abdominal pain, hematochezia, melena, lower extremity edema.  Wt Readings from Last 3 Encounters:  07/09/13 150 lb 11.2 oz (68.357 kg)  06/18/13 153 lb (69.4 kg)  06/18/13 153 lb (69.4 kg)     Past Medical History  Diagnosis Date  . Hypertension     Current Outpatient  Prescriptions  Medication Sig Dispense Refill  . alendronate (FOSAMAX) 70 MG tablet Take 70 mg by mouth every 7 (seven) days. Take with a full glass of water on an empty stomach.      Marland Kitchen amLODipine (NORVASC) 10 MG tablet Take 10 mg by mouth daily.        Marland Kitchen aspirin 81 MG chewable tablet Chew 1 tablet (81 mg total) by mouth daily.      Marland Kitchen buPROPion (WELLBUTRIN XL) 300 MG 24 hr tablet Take 300 mg by mouth every morning.      . donepezil (ARICEPT) 5 MG tablet Take 5 mg by mouth 2 (two) times daily.      Marland Kitchen lisinopril (PRINIVIL,ZESTRIL) 40 MG tablet Take 40 mg by mouth daily.      . memantine (NAMENDA) 10 MG tablet Take 10 mg by mouth 2 (two) times daily.      . metoprolol (LOPRESSOR) 50 MG tablet Take 1 tablet (50 mg total) by mouth 2 (two) times daily.  60 tablet  5  . naproxen (NAPROSYN) 500 MG tablet Take 500 mg by mouth 2 (two) times daily with a meal.        . nitroGLYCERIN (NITROSTAT) 0.4 MG SL tablet Place 1 tablet (0.4 mg total) under the tongue every 5 (five) minutes x 3 doses as needed for chest pain.  25 tablet  2  . omeprazole (PRILOSEC) 20 MG capsule Take 20 mg by mouth daily.      . pravastatin (PRAVACHOL) 40 MG tablet Take 40 mg by mouth every evening.      Marland Kitchen  isosorbide mononitrate (IMDUR) 30 MG 24 hr tablet Take 1 tablet (30 mg total) by mouth daily.  90 tablet  3   No current facility-administered medications for this visit.    Allergies:   No Known Allergies  Social History:  The patient  reports that she has been smoking Cigarettes.  She has a 40.5 pack-year smoking history. She has never used smokeless tobacco. She reports that she does not drink alcohol or use illicit drugs.   Family history:   Family History  Problem Relation Age of Onset  . Stroke Mother     ROS:  Please see the history of present illness.  All other systems reviewed and negative.   PHYSICAL EXAM: VS:  BP 142/82  Pulse 60  Ht 5' 5.5" (1.664 m)  Wt 150 lb 11.2 oz (68.357 kg)  BMI 24.69 kg/m2 Well  nourished, well developed, in no acute distress HEENT: Pupils are equal round react to light accommodation extraocular movements are intact.  Neck: no JVDNo cervical lymphadenopathy. Cardiac: Regular rate and rhythm without murmurs rubs or gallops. Lungs:  clear to auscultation bilaterally, no wheezing, rhonchi or rales Abd: soft, nontender, positive bowel sounds all quadrants,  Ext: no lower extremity edema.  2+ radial and dorsalis pedis pulses. Skin: warm and dry.  Right groin cath site is well-healed no ecchymosis erythema or hematoma. Also nontender Neuro:  Grossly normal  EKG:  Sinus rhythm with first degree AV block, left axis deviation Rate 60  ASSESSMENT AND PLAN:  Problem List Items Addressed This Visit   Unstable angina     One episode of chest pain associated nausea and diaphoresis while taking a bath. We'll start imdur 30 mg.    Relevant Medications      isosorbide mononitrate (IMDUR) 24 hr tablet   HYPERTENSION     Blood pressure mildly elevated at this time. I will be starting Imdur 30 mg recurrent chest pain which may help her blood pressures as well.    Relevant Medications      isosorbide mononitrate (IMDUR) 24 hr tablet   HLD (hyperlipidemia)     Treated with pravastatin    Relevant Medications      isosorbide mononitrate (IMDUR) 24 hr tablet    Other Visit Diagnoses   Chest pain    -  Primary    Relevant Orders       EKG 12-Lead

## 2013-07-09 NOTE — Assessment & Plan Note (Signed)
Blood pressure mildly elevated at this time. I will be starting Imdur 30 mg recurrent chest pain which may help her blood pressures as well.

## 2013-07-09 NOTE — Patient Instructions (Signed)
Start taking Imdur 30mg  daily.  Followup with Dr. Allyson Sabal in 3 months

## 2014-02-11 ENCOUNTER — Other Ambulatory Visit (HOSPITAL_COMMUNITY): Payer: Self-pay | Admitting: Cardiology

## 2014-02-11 NOTE — Telephone Encounter (Signed)
Rx was sent to pharmacy electronically. 

## 2014-08-10 ENCOUNTER — Other Ambulatory Visit: Payer: Self-pay | Admitting: Physician Assistant

## 2014-08-11 NOTE — Telephone Encounter (Signed)
Rx was sent to pharmacy electronically. 

## 2014-09-21 ENCOUNTER — Other Ambulatory Visit (HOSPITAL_COMMUNITY): Payer: Self-pay | Admitting: Cardiovascular Disease

## 2014-09-22 NOTE — Telephone Encounter (Signed)
Rx was sent to pharmacy electronically. 

## 2014-10-20 ENCOUNTER — Other Ambulatory Visit: Payer: Self-pay | Admitting: Cardiovascular Disease

## 2014-10-24 ENCOUNTER — Other Ambulatory Visit (HOSPITAL_COMMUNITY): Payer: Self-pay | Admitting: Family Medicine

## 2014-10-24 ENCOUNTER — Ambulatory Visit (HOSPITAL_COMMUNITY)
Admission: RE | Admit: 2014-10-24 | Discharge: 2014-10-24 | Disposition: A | Discharge status: Home / Self Care | Payer: Medicare Other | Source: Ambulatory Visit | Attending: Family Medicine | Admitting: Family Medicine

## 2014-10-24 ENCOUNTER — Other Ambulatory Visit: Payer: Self-pay | Admitting: Cardiovascular Disease

## 2014-10-24 ENCOUNTER — Other Ambulatory Visit: Payer: Self-pay

## 2014-10-24 DIAGNOSIS — Z981 Arthrodesis status: Secondary | ICD-10-CM | POA: Insufficient documentation

## 2014-10-24 DIAGNOSIS — M545 Low back pain: Secondary | ICD-10-CM | POA: Insufficient documentation

## 2014-10-24 DIAGNOSIS — M1612 Unilateral primary osteoarthritis, left hip: Secondary | ICD-10-CM | POA: Diagnosis not present

## 2014-10-24 DIAGNOSIS — M5136 Other intervertebral disc degeneration, lumbar region: Secondary | ICD-10-CM | POA: Insufficient documentation

## 2014-10-24 DIAGNOSIS — M25552 Pain in left hip: Secondary | ICD-10-CM | POA: Insufficient documentation

## 2014-10-24 MED ORDER — METOPROLOL TARTRATE 50 MG PO TABS: 50.0000 mg | ORAL_TABLET | Freq: Two times a day (BID) | ORAL | Status: DC

## 2014-10-24 NOTE — Telephone Encounter (Signed)
Rx sent to pharmacy   

## 2014-10-27 NOTE — Telephone Encounter (Signed)
Rx denied Rx was filled 10/24/14

## 2014-11-03 NOTE — Telephone Encounter (Signed)
Rx refill denied. Patient has not been seen since 06/2013

## 2014-11-20 ENCOUNTER — Encounter (HOSPITAL_COMMUNITY): Payer: Self-pay | Admitting: Cardiovascular Disease

## 2014-11-30 ENCOUNTER — Other Ambulatory Visit: Payer: Self-pay | Admitting: Physician Assistant

## 2014-12-01 NOTE — Telephone Encounter (Signed)
Rx refill denied to patient pharmacy, patient has not been seen since 06/2013

## 2014-12-23 ENCOUNTER — Other Ambulatory Visit: Payer: Self-pay | Admitting: Physician Assistant

## 2014-12-24 NOTE — Telephone Encounter (Signed)
Rx(s) sent to pharmacy electronically. Last OV 06/2013 Note given to scheduler to contact patient for appointment.

## 2015-01-06 ENCOUNTER — Other Ambulatory Visit: Payer: Self-pay | Admitting: Cardiovascular Disease

## 2015-01-07 NOTE — Telephone Encounter (Signed)
Rx(s) sent to pharmacy electronically. Staff message sent to [facility] (scheduler) to contact patient for appointment  Last OV 06/2013

## 2015-01-09 ENCOUNTER — Telehealth: Payer: Self-pay | Admitting: Cardiovascular Disease

## 2015-01-21 NOTE — Telephone Encounter (Signed)
Closed encounter °

## 2015-02-25 ENCOUNTER — Ambulatory Visit: Payer: Medicare Other | Admitting: Cardiovascular Disease

## 2015-03-13 ENCOUNTER — Encounter (HOSPITAL_COMMUNITY): Payer: Self-pay | Admitting: Emergency Medicine

## 2015-03-13 ENCOUNTER — Emergency Department (HOSPITAL_COMMUNITY)
Admission: EM | Admit: 2015-03-13 | Discharge: 2015-03-13 | Disposition: A | Discharge status: Home / Self Care | Payer: Medicare Other | Attending: Emergency Medicine | Admitting: Emergency Medicine

## 2015-03-13 ENCOUNTER — Emergency Department (HOSPITAL_COMMUNITY): Payer: Medicare Other

## 2015-03-13 DIAGNOSIS — R001 Bradycardia, unspecified: Secondary | ICD-10-CM | POA: Diagnosis not present

## 2015-03-13 DIAGNOSIS — I10 Essential (primary) hypertension: Secondary | ICD-10-CM | POA: Insufficient documentation

## 2015-03-13 DIAGNOSIS — Z79899 Other long term (current) drug therapy: Secondary | ICD-10-CM | POA: Diagnosis not present

## 2015-03-13 DIAGNOSIS — Z7982 Long term (current) use of aspirin: Secondary | ICD-10-CM | POA: Insufficient documentation

## 2015-03-13 DIAGNOSIS — S99922A Unspecified injury of left foot, initial encounter: Secondary | ICD-10-CM | POA: Diagnosis present

## 2015-03-13 DIAGNOSIS — S92912A Unspecified fracture of left toe(s), initial encounter for closed fracture: Secondary | ICD-10-CM

## 2015-03-13 DIAGNOSIS — Y939 Activity, unspecified: Secondary | ICD-10-CM | POA: Insufficient documentation

## 2015-03-13 DIAGNOSIS — Y929 Unspecified place or not applicable: Secondary | ICD-10-CM | POA: Insufficient documentation

## 2015-03-13 DIAGNOSIS — X58XXXA Exposure to other specified factors, initial encounter: Secondary | ICD-10-CM | POA: Diagnosis not present

## 2015-03-13 DIAGNOSIS — Z720 Tobacco use: Secondary | ICD-10-CM | POA: Insufficient documentation

## 2015-03-13 DIAGNOSIS — Z791 Long term (current) use of non-steroidal anti-inflammatories (NSAID): Secondary | ICD-10-CM | POA: Insufficient documentation

## 2015-03-13 DIAGNOSIS — Y998 Other external cause status: Secondary | ICD-10-CM | POA: Diagnosis not present

## 2015-03-13 DIAGNOSIS — S92525A Nondisplaced fracture of medial phalanx of left lesser toe(s), initial encounter for closed fracture: Secondary | ICD-10-CM | POA: Insufficient documentation

## 2015-03-13 DIAGNOSIS — S91105A Unspecified open wound of left lesser toe(s) without damage to nail, initial encounter: Secondary | ICD-10-CM | POA: Diagnosis not present

## 2015-03-13 LAB — CBG MONITORING, ED: Glucose-Capillary: 93 mg/dL (ref 70–99)

## 2015-03-13 MED ORDER — SULFAMETHOXAZOLE-TRIMETHOPRIM 800-160 MG PO TABS: 1.0000 | ORAL_TABLET | Freq: Two times a day (BID) | ORAL | Status: AC

## 2015-03-13 MED ORDER — DOUBLE ANTIBIOTIC 500-10000 UNIT/GM EX OINT: TOPICAL_OINTMENT | Freq: Once | CUTANEOUS | Status: DC

## 2015-03-13 NOTE — ED Notes (Signed)
Pt c/o pain and bruising to left 4th toe and foot x 2-3 day. No erythema noted. C/m/s intact. Denies trauma.

## 2015-03-13 NOTE — ED Notes (Addendum)
0.5 x 0.5 cm ulcerated wound on blanching, erythematous, edematous base on MIP of L 4th toe.  Erythema and edema spreads  4cm x 5cm on anterior surface of of foot, proximal to PIP.  0.5 x 0.7 cm hard, non-blanching, brown nodule on tip of R great toe, non-tender to palpation.  0.25 x 0.4 cm hardened callous on MIP of R 4th toe.  Patient denies any loss of sensation on feet, but when tested w/eyes closed, she was unable to discern sharp touch on either foot, except on plantar surface of L foot.  Also had diminished sensation on bilateral tibial surfaces.  Denies any known injury, fevers, myalgias, affected gait.

## 2015-03-13 NOTE — Discharge Instructions (Signed)
Follow up with Dr. Cindie Laroche. Return here as needed.

## 2015-03-13 NOTE — ED Notes (Signed)
Notified [facility] of patient's d/c. They are to pick up. Reviewed rx and care of L foot w/Donna at [facility].  Instructed patient on keeping foot covered and using post-op shoe.  Patient expresses understanding.  Patient warm,dry. VSS.  Discharged.  Left unit with steady gait.
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
## 2015-10-01 ENCOUNTER — Encounter (HOSPITAL_COMMUNITY): Payer: Self-pay | Admitting: Cardiology

## 2015-10-01 ENCOUNTER — Emergency Department (HOSPITAL_COMMUNITY): Payer: Medicare Other

## 2015-10-01 ENCOUNTER — Emergency Department (HOSPITAL_COMMUNITY)
Admission: EM | Admit: 2015-10-01 | Discharge: 2015-10-01 | Disposition: A | Discharge status: Home / Self Care | Payer: Medicare Other | Attending: Emergency Medicine | Admitting: Emergency Medicine

## 2015-10-01 DIAGNOSIS — R0602 Shortness of breath: Secondary | ICD-10-CM | POA: Insufficient documentation

## 2015-10-01 DIAGNOSIS — R1012 Left upper quadrant pain: Secondary | ICD-10-CM | POA: Diagnosis present

## 2015-10-01 DIAGNOSIS — I200 Unstable angina: Secondary | ICD-10-CM | POA: Insufficient documentation

## 2015-10-01 DIAGNOSIS — Z720 Tobacco use: Secondary | ICD-10-CM | POA: Diagnosis not present

## 2015-10-01 DIAGNOSIS — Z7982 Long term (current) use of aspirin: Secondary | ICD-10-CM | POA: Insufficient documentation

## 2015-10-01 DIAGNOSIS — I10 Essential (primary) hypertension: Secondary | ICD-10-CM | POA: Insufficient documentation

## 2015-10-01 DIAGNOSIS — Z8739 Personal history of other diseases of the musculoskeletal system and connective tissue: Secondary | ICD-10-CM | POA: Insufficient documentation

## 2015-10-01 DIAGNOSIS — Z79899 Other long term (current) drug therapy: Secondary | ICD-10-CM | POA: Insufficient documentation

## 2015-10-01 DIAGNOSIS — Z8659 Personal history of other mental and behavioral disorders: Secondary | ICD-10-CM | POA: Insufficient documentation

## 2015-10-01 DIAGNOSIS — E785 Hyperlipidemia, unspecified: Secondary | ICD-10-CM | POA: Diagnosis not present

## 2015-10-01 DIAGNOSIS — Z791 Long term (current) use of non-steroidal anti-inflammatories (NSAID): Secondary | ICD-10-CM | POA: Diagnosis not present

## 2015-10-01 DIAGNOSIS — N390 Urinary tract infection, site not specified: Secondary | ICD-10-CM | POA: Insufficient documentation

## 2015-10-01 HISTORY — DX: Depression, unspecified: F32.A

## 2015-10-01 HISTORY — DX: Hyperlipidemia, unspecified: E78.5

## 2015-10-01 HISTORY — DX: Other intervertebral disc degeneration, lumbar region: M51.36

## 2015-10-01 HISTORY — DX: Unstable angina: I20.0

## 2015-10-01 HISTORY — DX: Major depressive disorder, single episode, unspecified: F32.9

## 2015-10-01 HISTORY — DX: Other intervertebral disc degeneration, lumbar region without mention of lumbar back pain or lower extremity pain: M51.369

## 2015-10-01 LAB — BASIC METABOLIC PANEL
Anion gap: 8 (ref 5–15)
BUN: 16 mg/dL (ref 6–20)
CO2: 28 mmol/L (ref 22–32)
Calcium: 9.1 mg/dL (ref 8.9–10.3)
Chloride: 105 mmol/L (ref 101–111)
Creatinine, Ser: 0.97 mg/dL (ref 0.44–1.00)
GFR calc Af Amer: >60 mL/min (ref >60)
GFR calc non Af Amer: 57 mL/min — ABNORMAL LOW (ref >60)
GLUCOSE: 98 mg/dL (ref 65–99)
Potassium: 3.8 mmol/L (ref 3.5–5.1)
Sodium: 141 mmol/L (ref 135–145)

## 2015-10-01 LAB — URINE MICROSCOPIC-ADD ON

## 2015-10-01 LAB — CBC WITH DIFFERENTIAL/PLATELET
Basophils Absolute: 0 10*3/uL (ref 0.0–0.1)
Basophils Relative: 0 %
Eosinophils Absolute: 0.1 10*3/uL (ref 0.0–0.7)
Eosinophils Relative: 1 %
HCT: 43.4 % (ref 36.0–46.0)
HEMOGLOBIN: 14.3 g/dL (ref 12.0–15.0)
Lymphocytes Relative: 32 %
Lymphs Abs: 2.3 10*3/uL (ref 0.7–4.0)
MCH: 28.1 pg (ref 26.0–34.0)
MCHC: 32.9 g/dL (ref 30.0–36.0)
MCV: 85.3 fL (ref 78.0–100.0)
MONO ABS: 0.5 10*3/uL (ref 0.1–1.0)
Monocytes Relative: 7 %
NEUTROS ABS: 4.4 10*3/uL (ref 1.7–7.7)
Neutrophils Relative %: 60 %
Platelets: 248 10*3/uL (ref 150–400)
RBC: 5.09 MIL/uL (ref 3.87–5.11)
RDW: 14.4 % (ref 11.5–15.5)
WBC: 7.3 10*3/uL (ref 4.0–10.5)

## 2015-10-01 LAB — URINALYSIS, ROUTINE W REFLEX MICROSCOPIC
Bilirubin Urine: NEGATIVE
Glucose, UA: NEGATIVE mg/dL
Ketones, ur: NEGATIVE mg/dL
NITRITE: NEGATIVE
PH: 8 (ref 5.0–8.0)
Protein, ur: NEGATIVE mg/dL
SPECIFIC GRAVITY, URINE: 1.015 (ref 1.005–1.030)
Urobilinogen, UA: 0.2 mg/dL (ref 0.0–1.0)

## 2015-10-01 LAB — TROPONIN I: Troponin I: <0.03 ng/mL (ref <0.031)

## 2015-10-01 MED ORDER — CEPHALEXIN 500 MG PO CAPS: 500.0000 mg | ORAL_CAPSULE | Freq: Four times a day (QID) | ORAL | Status: DC

## 2015-10-01 MED ORDER — CEPHALEXIN 500 MG PO CAPS
500.0000 mg | ORAL_CAPSULE | Freq: Once | ORAL | Status: AC
  Administered 2015-10-01: 500 mg via ORAL
  Filled 2015-10-01: qty 1
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
## 2015-10-01 NOTE — ED Notes (Signed)
LUQ abdominal pain ,  Decreased appetite times 3days.  Resident at [facility].  Pt has not had her medicines in 3 days.  B/p 190/110with ems.

## 2015-10-01 NOTE — ED Notes (Signed)
Spoke with daughter on the phone and states she is sick that [facility] will have to come pick her up.

## 2015-10-01 NOTE — Discharge Instructions (Signed)

## 2015-10-02 LAB — URINE CULTURE

## 2016-02-23 ENCOUNTER — Observation Stay (HOSPITAL_COMMUNITY)
Admission: EM | Admit: 2016-02-23 | Discharge: 2016-02-25 | Disposition: A | Discharge status: Home / Self Care | Payer: Medicare Other | Attending: Family Medicine | Admitting: Family Medicine

## 2016-02-23 ENCOUNTER — Encounter (HOSPITAL_COMMUNITY): Payer: Self-pay | Admitting: Emergency Medicine

## 2016-02-23 ENCOUNTER — Emergency Department (HOSPITAL_COMMUNITY): Payer: Medicare Other

## 2016-02-23 DIAGNOSIS — Z7982 Long term (current) use of aspirin: Secondary | ICD-10-CM | POA: Insufficient documentation

## 2016-02-23 DIAGNOSIS — Z79899 Other long term (current) drug therapy: Secondary | ICD-10-CM | POA: Insufficient documentation

## 2016-02-23 DIAGNOSIS — G8929 Other chronic pain: Secondary | ICD-10-CM | POA: Diagnosis present

## 2016-02-23 DIAGNOSIS — F32A Depression, unspecified: Secondary | ICD-10-CM | POA: Diagnosis present

## 2016-02-23 DIAGNOSIS — E785 Hyperlipidemia, unspecified: Secondary | ICD-10-CM | POA: Insufficient documentation

## 2016-02-23 DIAGNOSIS — R079 Chest pain, unspecified: Secondary | ICD-10-CM | POA: Diagnosis not present

## 2016-02-23 DIAGNOSIS — F17210 Nicotine dependence, cigarettes, uncomplicated: Secondary | ICD-10-CM | POA: Insufficient documentation

## 2016-02-23 DIAGNOSIS — F17200 Nicotine dependence, unspecified, uncomplicated: Secondary | ICD-10-CM | POA: Diagnosis not present

## 2016-02-23 DIAGNOSIS — F329 Major depressive disorder, single episode, unspecified: Secondary | ICD-10-CM

## 2016-02-23 DIAGNOSIS — R0789 Other chest pain: Secondary | ICD-10-CM

## 2016-02-23 DIAGNOSIS — F0390 Unspecified dementia without behavioral disturbance: Secondary | ICD-10-CM | POA: Insufficient documentation

## 2016-02-23 DIAGNOSIS — I10 Essential (primary) hypertension: Secondary | ICD-10-CM | POA: Diagnosis not present

## 2016-02-23 DIAGNOSIS — M549 Dorsalgia, unspecified: Secondary | ICD-10-CM

## 2016-02-23 HISTORY — DX: Atherosclerotic heart disease of native coronary artery without angina pectoris: I25.10

## 2016-02-23 HISTORY — DX: Essential (primary) hypertension: I10

## 2016-02-23 LAB — COMPREHENSIVE METABOLIC PANEL
ALT: 13 U/L — AB (ref 14–54)
AST: 18 U/L (ref 15–41)
Albumin: 3.7 g/dL (ref 3.5–5.0)
Alkaline Phosphatase: 76 U/L (ref 38–126)
Anion gap: 7 (ref 5–15)
BILIRUBIN TOTAL: 0.6 mg/dL (ref 0.3–1.2)
BUN: 20 mg/dL (ref 6–20)
CALCIUM: 8.9 mg/dL (ref 8.9–10.3)
CO2: 26 mmol/L (ref 22–32)
CREATININE: 0.92 mg/dL (ref 0.44–1.00)
Chloride: 105 mmol/L (ref 101–111)
GFR calc Af Amer: >60 mL/min (ref >60)
Glucose, Bld: 128 mg/dL — ABNORMAL HIGH (ref 65–99)
POTASSIUM: 3.9 mmol/L (ref 3.5–5.1)
Sodium: 138 mmol/L (ref 135–145)
TOTAL PROTEIN: 6.4 g/dL — AB (ref 6.5–8.1)

## 2016-02-23 LAB — CBC
HCT: 42.4 % (ref 36.0–46.0)
Hemoglobin: 14 g/dL (ref 12.0–15.0)
MCH: 28.2 pg (ref 26.0–34.0)
MCHC: 33 g/dL (ref 30.0–36.0)
MCV: 85.3 fL (ref 78.0–100.0)
Platelets: 259 10*3/uL (ref 150–400)
RBC: 4.97 MIL/uL (ref 3.87–5.11)
RDW: 14.3 % (ref 11.5–15.5)
WBC: 7.6 10*3/uL (ref 4.0–10.5)

## 2016-02-23 LAB — LIPASE, BLOOD: Lipase: 23 U/L (ref 11–51)

## 2016-02-23 LAB — TROPONIN I

## 2016-02-23 LAB — MRSA PCR SCREENING: MRSA by PCR: NEGATIVE

## 2016-02-23 MED ORDER — AMLODIPINE BESYLATE 5 MG PO TABS
10.0000 mg | ORAL_TABLET | Freq: Every day | ORAL | Status: DC
  Administered 2016-02-24 – 2016-02-25 (×2): 10 mg via ORAL
  Filled 2016-02-23 (×2): qty 2

## 2016-02-23 MED ORDER — ASPIRIN 81 MG PO CHEW
81.0000 mg | CHEWABLE_TABLET | Freq: Every day | ORAL | Status: DC
  Administered 2016-02-24 – 2016-02-25 (×2): 81 mg via ORAL
  Filled 2016-02-23 (×2): qty 1

## 2016-02-23 MED ORDER — BUPROPION HCL ER (XL) 300 MG PO TB24
300.0000 mg | ORAL_TABLET | Freq: Every day | ORAL | Status: DC
  Administered 2016-02-24 – 2016-02-25 (×2): 300 mg via ORAL
  Filled 2016-02-23 (×3): qty 1

## 2016-02-23 MED ORDER — OXYCODONE HCL 5 MG PO TABS: 5.0000 mg | ORAL_TABLET | ORAL | Status: DC | PRN

## 2016-02-23 MED ORDER — MEMANTINE HCL 10 MG PO TABS
10.0000 mg | ORAL_TABLET | Freq: Two times a day (BID) | ORAL | Status: DC
  Administered 2016-02-23 – 2016-02-25 (×4): 10 mg via ORAL
  Filled 2016-02-23 (×4): qty 1

## 2016-02-23 MED ORDER — LISINOPRIL 10 MG PO TABS
40.0000 mg | ORAL_TABLET | Freq: Every day | ORAL | Status: DC
  Administered 2016-02-24 – 2016-02-25 (×2): 40 mg via ORAL
  Filled 2016-02-23 (×2): qty 4

## 2016-02-23 MED ORDER — NITROGLYCERIN 0.4 MG SL SUBL: 0.4000 mg | SUBLINGUAL_TABLET | SUBLINGUAL | Status: DC | PRN

## 2016-02-23 MED ORDER — ASPIRIN 81 MG PO CHEW
324.0000 mg | CHEWABLE_TABLET | Freq: Once | ORAL | Status: AC
  Administered 2016-02-23: 324 mg via ORAL
  Filled 2016-02-23: qty 4

## 2016-02-23 MED ORDER — ISOSORBIDE MONONITRATE ER 60 MG PO TB24
30.0000 mg | ORAL_TABLET | Freq: Every day | ORAL | Status: DC
  Administered 2016-02-23 – 2016-02-24 (×2): 30 mg via ORAL
  Filled 2016-02-23 (×2): qty 1

## 2016-02-23 MED ORDER — GI COCKTAIL ~~LOC~~: 30.0000 mL | Freq: Four times a day (QID) | ORAL | Status: DC | PRN

## 2016-02-23 MED ORDER — ONDANSETRON HCL 4 MG/2ML IJ SOLN: 4.0000 mg | Freq: Four times a day (QID) | INTRAMUSCULAR | Status: DC | PRN

## 2016-02-23 MED ORDER — ZOLPIDEM TARTRATE 5 MG PO TABS: 5.0000 mg | ORAL_TABLET | Freq: Every evening | ORAL | Status: DC | PRN

## 2016-02-23 MED ORDER — ENOXAPARIN SODIUM 40 MG/0.4ML ~~LOC~~ SOLN
40.0000 mg | SUBCUTANEOUS | Status: DC
  Administered 2016-02-23 – 2016-02-24 (×2): 40 mg via SUBCUTANEOUS
  Filled 2016-02-23 (×2): qty 0.4

## 2016-02-23 MED ORDER — PRAVASTATIN SODIUM 40 MG PO TABS
40.0000 mg | ORAL_TABLET | Freq: Every evening | ORAL | Status: DC
Start: 2016-02-23 — End: 2016-02-25
  Administered 2016-02-23 – 2016-02-24 (×2): 40 mg via ORAL
  Filled 2016-02-23 (×2): qty 1

## 2016-02-23 MED ORDER — PANTOPRAZOLE SODIUM 40 MG PO TBEC
40.0000 mg | DELAYED_RELEASE_TABLET | Freq: Every day | ORAL | Status: DC
  Administered 2016-02-24 – 2016-02-25 (×2): 40 mg via ORAL
  Filled 2016-02-23 (×3): qty 1

## 2016-02-23 MED ORDER — METOPROLOL TARTRATE 50 MG PO TABS
50.0000 mg | ORAL_TABLET | Freq: Two times a day (BID) | ORAL | Status: DC
  Administered 2016-02-23 – 2016-02-25 (×4): 50 mg via ORAL
  Filled 2016-02-23 (×4): qty 1

## 2016-02-23 MED ORDER — DONEPEZIL HCL 5 MG PO TABS
10.0000 mg | ORAL_TABLET | Freq: Every day | ORAL | Status: DC
  Administered 2016-02-23 – 2016-02-24 (×2): 10 mg via ORAL
  Filled 2016-02-23 (×2): qty 2

## 2016-02-23 MED ORDER — ACETAMINOPHEN 325 MG PO TABS: 650.0000 mg | ORAL_TABLET | ORAL | Status: DC | PRN
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
## 2016-02-23 NOTE — ED Notes (Signed)
Patient with c/o "chest pain", points to epigastric/stomach area when asked where she was hurting. Pain in area for 3 days. Lives at [facility]. H/o dementia
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
## 2016-02-24 ENCOUNTER — Encounter (HOSPITAL_COMMUNITY): Payer: Self-pay | Admitting: Cardiology

## 2016-02-24 DIAGNOSIS — F0390 Unspecified dementia without behavioral disturbance: Secondary | ICD-10-CM

## 2016-02-24 DIAGNOSIS — E785 Hyperlipidemia, unspecified: Secondary | ICD-10-CM | POA: Diagnosis not present

## 2016-02-24 DIAGNOSIS — I10 Essential (primary) hypertension: Secondary | ICD-10-CM | POA: Diagnosis not present

## 2016-02-24 DIAGNOSIS — I25119 Atherosclerotic heart disease of native coronary artery with unspecified angina pectoris: Secondary | ICD-10-CM | POA: Diagnosis not present

## 2016-02-24 DIAGNOSIS — R079 Chest pain, unspecified: Secondary | ICD-10-CM | POA: Diagnosis not present

## 2016-02-24 LAB — TROPONIN I: Troponin I: <0.03 ng/mL (ref <0.031)

## 2016-02-24 MED ORDER — ISOSORBIDE MONONITRATE ER 60 MG PO TB24
60.0000 mg | ORAL_TABLET | Freq: Every day | ORAL | Status: DC
  Administered 2016-02-25: 60 mg via ORAL
  Filled 2016-02-24: qty 1
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
## 2016-02-24 NOTE — Clinical Social Work Note (Signed)
Clinical Social Work Assessment  Patient Details  Name: Melissa Baird MRN: [id number] Date of Birth: 1941/04/23  Date of referral:  02/24/16               Reason for consult:  Facility Placement                Permission sought to share information with:  Case Manager, Customer service manager, Family Supports Permission granted to share information::  Yes, Verbal Permission Granted  Name::        Agency::  [facility]  Relationship::  Adult Children  Contact Information:     Housing/Transportation Living arrangements for the past 2 months:  [place] of Information:  Patient, Medical Team Patient Interpreter Needed:  None Criminal Activity/Legal Involvement Pertinent to Current Situation/Hospitalization:  No - Comment as needed Significant Relationships:  Adult Children, Warehouse manager, Other Family Members Lives with:  Self, Facility Resident Do you feel safe going back to the place where you live?  Yes Need for family participation in patient care:  No (Coment) (pt reports she can communicate all needs with family)  Care giving concerns:  No concerns noted by patient. She reports she has lived at [facility] for over 2 years and with her age and needs, [facility] will continue being her home.  Good family supports per patient who assist with transportation and needs.   Social Worker assessment / plan:  LCSW received report per progression that patient admitted from ALF.  Completed assessment with patient who confirms she is from a facility and agreeable with return. No concerns at this time with safety or care giving. Patient alert and oriented x4 and reports family is supportive when needed. At time of DC she will return to ALF with family transporting patient via [place]. LCSW explained role while in hospital and resources.  Patient denied all needs at this time. Patient aware she is in observation status and hopeful for return to ALF quickly. LCSW  updated FL2 for DC purposes.  MD please sign.  Employment status:  Retired Forensic scientist:  Medicare PT Recommendations:  Not assessed at this time [place] / Referral to community resources:  Other (Comment Required) (none requested)  Patient/Family's Response to care:  Agreeable to plan  Patient/Family's Understanding of and Emotional Response to Diagnosis, Current Treatment, and Prognosis:  Patient very understanding regarding prognosis and treatment plan.  Patient is hopeful and engaging in assessment.  We discuss her nail color and her intentions to repaint them when she gets back to facility.   Emotional Assessment Appearance:  Appears stated age Attitude/Demeanor/Rapport:  Other (pleasant, cooperative and engaging) Affect (typically observed):  Accepting, Happy, Hopeful Orientation:  Oriented to Self, Oriented to Place, Oriented to  Time, Oriented to Situation Alcohol / Substance use:  Not Applicable Psych involvement (Current and /or in the community):  No (Comment)  Discharge Needs  Concerns to be addressed:  No discharge needs identified Readmission within the last 30 days:  No Current discharge risk:  None Barriers to Discharge:  No Barriers Identified, Continued Medical Work up   Lilly [place], LCSW 02/24/2016, 11:00 AM

## 2016-02-24 NOTE — Care Management Obs Status (Signed)
[facility] NOTIFICATION   Patient Details  Name: Melissa Baird MRN: [id number] Date of Birth: May 18, 1941   Medicare Observation Status Notification Given:  Yes    Alvie Heidelberg, RN 02/24/2016, 11:04 AM
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
## 2016-02-24 NOTE — NC FL2 (Addendum)
[place] LEVEL OF CARE SCREENING TOOL     IDENTIFICATION  Patient Name: Melissa Baird Birthdate: 1941-07-19 Sex: female Admission Date (Current Location): 02/23/2016  [facility] and [place] Number:  [employer] and Address:  [place] [address] [place]      Provider Number: [id number]  Attending Physician Name and Address:  Lucia Gaskins, MD  Relative Name and Phone Number:       Current Level of Care: Hospital ([place]) Recommended Level of Care: [place] Prior Approval Number:    Date Approved/Denied:   PASRR Number:    Discharge Plan: Other (Comment) (Assisted Living)    Current Diagnoses: Patient Active Problem List   Diagnosis Date Noted  . Chest pain 02/23/2016  . Dementia   . Unstable angina ([place]) 06/17/2013  . HLD (hyperlipidemia) 06/17/2013  . TOBACCO ABUSE 09/30/2010  . 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 [place] DISEASE, LUMBAR 09/14/2010  . Back pain, chronic 09/14/2010  . Depression 09/03/2010  . Essential hypertension 09/03/2010  . SYNCOPE 09/03/2010    Orientation RESPIRATION BLADDER Height & Weight     Self, Time, Situation, Place  Normal Continent Weight: 148 lb 13 oz (67.5 kg) Height:  5\' 5"  (165.1 cm)  BEHAVIORAL SYMPTOMS/MOOD NEUROLOGICAL BOWEL NUTRITION STATUS  Other (Comment) (no behaviors)   Continent Diet (normal)  AMBULATORY STATUS COMMUNICATION OF NEEDS Skin Regular, no added salt diet  Supervision Verbally Normal                       Personal Care Assistance Level of Assistance  Bathing, Feeding, Dressing Bathing Assistance: Limited assistance Feeding assistance: Limited assistance Dressing Assistance: Limited assistance     Functional Limitations Therapist, nutritional, Speech Sight Info: Adequate Hearing Info: Impaired Speech Info: Adequate    SPECIAL CARE FACTORS FREQUENCY                       Contractures  Contractures Info: Not present    Additional Factors Info  Code Status, Allergies, Psychotropic, Insulin Sliding Scale, Isolation Precautions Code Status Info: Full Code Allergies Info: NKA Psychotropic Info: Wellbutrin: depression/mood stabilizer   namenda and Aericept: memory Insulin Sliding Scale Info: none Isolation Precautions Info: none     Current Medications (02/24/2016):  This is the current hospital active medication list Current Facility-Administered Medications  Medication Dose Route Frequency Provider Last Rate Last Dose  . acetaminophen (TYLENOL) tablet 650 mg  650 mg Oral Q4H PRN Roney Jaffe, MD      . amLODipine (NORVASC) tablet 10 mg  10 mg Oral Daily Roney Jaffe, MD   10 mg at 02/24/16 1007  . aspirin chewable tablet 81 mg  81 mg Oral Daily Roney Jaffe, MD   81 mg at 02/24/16 1007  . buPROPion (WELLBUTRIN XL) 24 hr tablet 300 mg  300 mg Oral Daily Roney Jaffe, MD   300 mg at 02/24/16 1008  . donepezil (ARICEPT) tablet 10 mg  10 mg Oral QHS Roney Jaffe, MD   10 mg at 02/23/16 2141  . enoxaparin (LOVENOX) injection 40 mg  40 mg Subcutaneous Q24H Roney Jaffe, MD   40 mg at 02/23/16 2143  . gi cocktail (Maalox,Lidocaine,Donnatal)  30 mL Oral QID PRN Roney Jaffe, MD      . isosorbide mononitrate (IMDUR) 24 hr tablet 30 mg  30 mg Oral Daily Roney Jaffe, MD   30 mg  at 02/24/16 1007  . lisinopril (PRINIVIL,ZESTRIL) tablet 40 mg  40 mg Oral Daily Roney Jaffe, MD   40 mg at 02/24/16 1007  . memantine (NAMENDA) tablet 10 mg  10 mg Oral BID Roney Jaffe, MD   10 mg at 02/24/16 1008  . metoprolol (LOPRESSOR) tablet 50 mg  50 mg Oral BID Roney Jaffe, MD   50 mg at 02/24/16 1007  . nitroGLYCERIN (NITROSTAT) SL tablet 0.4 mg  0.4 mg Sublingual Q5 min PRN Francine Graven, DO      . ondansetron [facility]) injection 4 mg  4 mg Intravenous Q6H PRN Roney Jaffe, MD      . oxyCODONE (Oxy IR/ROXICODONE) immediate release tablet 5 mg  5 mg Oral Q4H PRN Roney Jaffe, MD      . pantoprazole (PROTONIX) EC tablet 40 mg  40 mg Oral Daily Roney Jaffe, MD   40 mg at 02/24/16 1008  . pravastatin (PRAVACHOL) tablet 40 mg  40 mg Oral QPM Roney Jaffe, MD   40 mg at 02/23/16 2141  . zolpidem (AMBIEN) tablet 5 mg  5 mg Oral QHS PRN Roney Jaffe, MD         Discharge Medications: Please see discharge summary for a list of discharge medications.  Relevant Imaging Results:  Relevant Lab Results:   Additional Information    Lilly Cove, LCSW

## 2016-02-24 NOTE — Care Management Note (Signed)
Case Management Note  Patient Details  Name: Melissa Baird MRN: [id number] Date of Birth: 1941-01-01  Subjective/Objective:     Patient is from [facility].  Stated that she is fairly independent. Uses no DME . CSW following for return to ALF               Action/Plan: Anticipate discharge back to ALF.   Expected Discharge Date:                  Expected Discharge Plan:  Assisted Living / Rest Home  In-House Referral:     Discharge planning Services     Post Acute Care Choice:    Choice offered to:     DME Arranged:    DME Agency:     HH Arranged:    [place] Agency:     Status of Service:  Completed, signed off  Medicare Important Message Given:    Date Medicare IM Given:    Medicare IM give by:    Date Additional Medicare IM Given:    Additional Medicare Important Message give by:     If discussed at [place] of Stay Meetings, dates discussed:    Additional Comments:  Alvie Heidelberg, RN 02/24/2016, 11:05 AM

## 2016-02-25 DIAGNOSIS — R079 Chest pain, unspecified: Secondary | ICD-10-CM | POA: Diagnosis not present

## 2016-02-25 MED ORDER — ISOSORBIDE MONONITRATE ER 60 MG PO TB24: 60.0000 mg | ORAL_TABLET | Freq: Every day | ORAL | Status: DC

## 2016-02-25 MED ORDER — NITROGLYCERIN 0.4 MG SL SUBL: 0.4000 mg | SUBLINGUAL_TABLET | SUBLINGUAL | Status: DC | PRN

## 2016-02-25 NOTE — Progress Notes (Signed)
Patient is medically stable for discharge. Patient will return to [facility]F via [facility] transportation at 1:30pm. All clinicals faxed to facility and patient aware and agreeable to return.  Family aware of plan per patient and have been involved in care. No other needs at this time.  DC back to ALF.  Lane Hacker, MSW

## 2016-02-25 NOTE — Discharge Summary (Signed)
Physician Discharge Summary  Melissa Baird [id number] DOB: 06/27/1941 DOA: 02/23/2016  PCP: Maricela Curet, MD  Admit date: 02/23/2016 Discharge date: 02/25/2016   Recommendations for Outpatient Follow-up:  Follow my office in 1-2 weeks' time to assess her response to any clinical angina she is likewise instructed to take all all other previous medicines prior to admission to hospital Discharge Diagnoses:  Principal Problem:   Chest pain Active Problems:   TOBACCO ABUSE   Depression   Essential hypertension   Back pain, chronic   Dementia   Discharge Condition: Good and improving  Filed Weights   02/23/16 2036  Weight: 148 lb 13 oz (67.5 kg)    History of present illness:  [age] white female with moderate to advanced dementia was related to have stated she had some chest discomfort the day of admission patient specifically denies any chest pain on second hospital day the daughter was present states she did complain of chest pain she also refers to some flank discomfort 5 out of 30 days she is not to have moderate coronary disease by catheterization several years ago all with hypertension hyperlipidemia currently on significant medical therapy well controlled she was seen in consultation by cardiology felt due to her advanced dementia and lack of definitive anginal type chest discomfort we will increase her Imdur from 30-60 mg increased medical therapy and observe her response to this as as an outpatient basis as agreed on by cardiology and myself and was performed  Hospital Course:  See history of present illness above  Procedures:     Consultations:  Cardiology  Discharge Instructions  Discharge Instructions    Discharge instructions    Complete by:  As directed      Discharge patient    Complete by:  As directed             Medication List    TAKE these medications        amLODipine 10 MG tablet  Commonly known as:  NORVASC  Take 10 mg by  mouth daily.     aspirin 81 MG chewable tablet  Chew 1 tablet (81 mg total) by mouth daily.     buPROPion 300 MG 24 hr tablet  Commonly known as:  WELLBUTRIN XL  Take 300 mg by mouth every morning.     donepezil 10 MG tablet  Commonly known as:  ARICEPT  Take 10 mg by mouth at bedtime.     FOSAMAX 70 MG tablet  Generic drug:  alendronate  Take 70 mg by mouth every 7 (seven) days. Take with a full glass of water on an empty stomach.     isosorbide mononitrate 60 MG 24 hr tablet  Commonly known as:  IMDUR  Take 1 tablet (60 mg total) by mouth daily.     lisinopril 40 MG tablet  Commonly known as:  PRINIVIL,ZESTRIL  Take 40 mg by mouth daily.     memantine 10 MG tablet  Commonly known as:  NAMENDA  Take 10 mg by mouth 2 (two) times daily.     metoprolol 50 MG tablet  Commonly known as:  LOPRESSOR  TAKE 1 TABLET BY MOUTH TWICE DAILY. MUST MAKE APPOINTMENT FOR FUTURE REFILLS     naproxen 500 MG tablet  Commonly known as:  NAPROSYN  Take 500 mg by mouth 2 (two) times daily with a meal.     nitroGLYCERIN 0.4 MG SL tablet  Commonly known as:  NITROSTAT  Place 1  tablet (0.4 mg total) under the tongue every 5 (five) minutes as needed for chest pain.     omeprazole 20 MG capsule  Commonly known as:  PRILOSEC  Take 20 mg by mouth daily.     oxyCODONE 5 MG immediate release tablet  Commonly known as:  Oxy IR/ROXICODONE  Take 5 mg by mouth every 4 (four) hours as needed for severe pain.     pravastatin 40 MG tablet  Commonly known as:  PRAVACHOL  Take 40 mg by mouth every evening.       No Known Allergies     Follow-up Information    Follow up with Jory Sims, NP On 03/04/2016.   Specialties:  Nurse Practitioner, Radiology, Cardiology   Why:  @ 1:50 pm   Contact information:   [place] [address] [phone]        The results of significant diagnostics from this hospitalization (including imaging, microbiology, ancillary and laboratory)  are listed below for reference.    Significant Diagnostic Studies: Dg Chest 2 View  02/23/2016  CLINICAL DATA:  Intermittent episodes of left-sided chest pain for 3-4 days. EXAM: CHEST  2 VIEW COMPARISON:  10/01/2015 FINDINGS: Tortuous thoracic aorta with atherosclerotic aortic arch. Upper normal heart size. Indistinct densities peripherally at the left lung base may represent callus formation from healing rib fractures. The seemed to align with the left anterior sixth, seventh, and eighth ribs. I do not see fractures in this vicinity on prior exams. Large lung volumes. [place] projects over the right breast and chest. No pleural effusion or airspace opacity. IMPRESSION: 1. Vague densities project over the left anterolateral sixth, seventh, and eighth ribs, suspicious for healing rib fractures. Electronically Signed   By: Van Clines M.D.   On: 02/23/2016 17:49    Microbiology: Recent Results (from the past 240 hour(s))  MRSA PCR Screening     Status: None   Collection Time: 02/23/16  8:40 PM  Result Value Ref Range Status   MRSA by PCR NEGATIVE NEGATIVE Final    Comment:        The GeneXpert MRSA Assay (FDA approved for NASAL specimens only), is one component of a comprehensive MRSA colonization surveillance program. It is not intended to diagnose MRSA infection nor to guide or monitor treatment for MRSA infections.      Labs: Basic Metabolic Panel:  Recent Labs Lab 02/23/16 1652  NA 138  K 3.9  CL 105  CO2 26  GLUCOSE 128*  BUN 20  CREATININE 0.92  CALCIUM 8.9   Liver Function Tests:  Recent Labs Lab 02/23/16 1652  AST 18  ALT 13*  ALKPHOS 76  BILITOT 0.6  PROT 6.4*  ALBUMIN 3.7    Recent Labs Lab 02/23/16 1652  LIPASE 23   No results for input(s): AMMONIA in the last 168 hours. CBC:  Recent Labs Lab 02/23/16 1652  WBC 7.6  HGB 14.0  HCT 42.4  MCV 85.3  PLT 259   Cardiac Enzymes:  Recent Labs Lab 02/23/16 1652 02/23/16 2311  02/24/16 0145  TROPONINI <0.03 <0.03 <0.03   BNP: BNP (last 3 results) No results for input(s): BNP in the last 8760 hours.  ProBNP (last 3 results) No results for input(s): PROBNP in the last 8760 hours.  CBG: No results for input(s): GLUCAP in the last 168 hours.     Signed:  Anarely Nicholls Jerilynn Mages  [facility] Pager: [phone] 02/25/2016, 12:21 PM

## 2016-02-25 NOTE — Progress Notes (Signed)
Pt discharged back to [facility] today per Dr. Cindie Laroche.  Pt's IV site D/C'd and WDL.  Pt's VSS.  Facility arrived to unit/room to assist patient back to facility.  Pt left floor via WC in stable condition accompanied by nursing staff.

## 2016-02-26 ENCOUNTER — Emergency Department (HOSPITAL_COMMUNITY)
Admission: EM | Admit: 2016-02-26 | Discharge: 2016-02-26 | Disposition: A | Discharge status: Home / Self Care | Payer: Medicare Other | Attending: Emergency Medicine | Admitting: Emergency Medicine

## 2016-02-26 ENCOUNTER — Encounter (HOSPITAL_COMMUNITY): Payer: Self-pay

## 2016-02-26 ENCOUNTER — Emergency Department (HOSPITAL_COMMUNITY): Payer: Medicare Other

## 2016-02-26 DIAGNOSIS — E785 Hyperlipidemia, unspecified: Secondary | ICD-10-CM | POA: Diagnosis not present

## 2016-02-26 DIAGNOSIS — F329 Major depressive disorder, single episode, unspecified: Secondary | ICD-10-CM | POA: Insufficient documentation

## 2016-02-26 DIAGNOSIS — I2510 Atherosclerotic heart disease of native coronary artery without angina pectoris: Secondary | ICD-10-CM | POA: Insufficient documentation

## 2016-02-26 DIAGNOSIS — R0602 Shortness of breath: Secondary | ICD-10-CM | POA: Diagnosis not present

## 2016-02-26 DIAGNOSIS — F17210 Nicotine dependence, cigarettes, uncomplicated: Secondary | ICD-10-CM | POA: Diagnosis not present

## 2016-02-26 DIAGNOSIS — I10 Essential (primary) hypertension: Secondary | ICD-10-CM | POA: Insufficient documentation

## 2016-02-26 DIAGNOSIS — Z7982 Long term (current) use of aspirin: Secondary | ICD-10-CM | POA: Diagnosis not present

## 2016-02-26 DIAGNOSIS — R0789 Other chest pain: Secondary | ICD-10-CM | POA: Diagnosis not present

## 2016-02-26 DIAGNOSIS — R079 Chest pain, unspecified: Secondary | ICD-10-CM

## 2016-02-26 DIAGNOSIS — Z79899 Other long term (current) drug therapy: Secondary | ICD-10-CM | POA: Diagnosis not present

## 2016-02-26 LAB — BASIC METABOLIC PANEL
ANION GAP: 8 (ref 5–15)
BUN: 29 mg/dL — ABNORMAL HIGH (ref 6–20)
CALCIUM: 9.2 mg/dL (ref 8.9–10.3)
CHLORIDE: 101 mmol/L (ref 101–111)
CO2: 28 mmol/L (ref 22–32)
CREATININE: 1.25 mg/dL — AB (ref 0.44–1.00)
GFR calc non Af Amer: 42 mL/min — ABNORMAL LOW (ref >60)
GFR, EST AFRICAN AMERICAN: 48 mL/min — AB (ref >60)
Glucose, Bld: 117 mg/dL — ABNORMAL HIGH (ref 65–99)
Potassium: 4.3 mmol/L (ref 3.5–5.1)
SODIUM: 137 mmol/L (ref 135–145)

## 2016-02-26 LAB — HEPATIC FUNCTION PANEL
ALBUMIN: 4 g/dL (ref 3.5–5.0)
ALK PHOS: 77 U/L (ref 38–126)
ALT: 15 U/L (ref 14–54)
AST: 18 U/L (ref 15–41)
BILIRUBIN DIRECT: 0.1 mg/dL (ref 0.1–0.5)
BILIRUBIN TOTAL: 0.8 mg/dL (ref 0.3–1.2)
Indirect Bilirubin: 0.7 mg/dL (ref 0.3–0.9)
Total Protein: 7.1 g/dL (ref 6.5–8.1)

## 2016-02-26 LAB — URINALYSIS, ROUTINE W REFLEX MICROSCOPIC
Bilirubin Urine: NEGATIVE
GLUCOSE, UA: NEGATIVE mg/dL
Hgb urine dipstick: NEGATIVE
Ketones, ur: NEGATIVE mg/dL
NITRITE: NEGATIVE
PH: 6 (ref 5.0–8.0)
Protein, ur: NEGATIVE mg/dL
SPECIFIC GRAVITY, URINE: 1.02 (ref 1.005–1.030)

## 2016-02-26 LAB — URINE MICROSCOPIC-ADD ON: RBC / HPF: NONE SEEN RBC/hpf (ref 0–5)

## 2016-02-26 LAB — CBC WITH DIFFERENTIAL/PLATELET
BASOS ABS: 0 10*3/uL (ref 0.0–0.1)
BASOS PCT: 0 %
EOS ABS: 0.1 10*3/uL (ref 0.0–0.7)
Eosinophils Relative: 1 %
HEMATOCRIT: 43.3 % (ref 36.0–46.0)
HEMOGLOBIN: 14.2 g/dL (ref 12.0–15.0)
Lymphocytes Relative: 23 %
Lymphs Abs: 2.6 10*3/uL (ref 0.7–4.0)
MCH: 28.1 pg (ref 26.0–34.0)
MCHC: 32.8 g/dL (ref 30.0–36.0)
MCV: 85.7 fL (ref 78.0–100.0)
MONOS PCT: 5 %
Monocytes Absolute: 0.6 10*3/uL (ref 0.1–1.0)
NEUTROS ABS: 8.2 10*3/uL — AB (ref 1.7–7.7)
NEUTROS PCT: 71 %
Platelets: 256 10*3/uL (ref 150–400)
RBC: 5.05 MIL/uL (ref 3.87–5.11)
RDW: 14.1 % (ref 11.5–15.5)
WBC: 11.5 10*3/uL — AB (ref 4.0–10.5)

## 2016-02-26 LAB — LIPASE, BLOOD: Lipase: 49 U/L (ref 11–51)

## 2016-02-26 LAB — TROPONIN I: Troponin I: <0.03 ng/mL (ref <0.031)

## 2016-02-26 MED ORDER — ISOSORBIDE MONONITRATE ER 30 MG PO TB24: 90.0000 mg | ORAL_TABLET | Freq: Every day | ORAL | Status: DC

## 2016-02-26 MED ORDER — SODIUM CHLORIDE 0.9 % IV BOLUS (SEPSIS)
1000.0000 mL | Freq: Once | INTRAVENOUS | Status: AC
  Administered 2016-02-26: 1000 mL via INTRAVENOUS

## 2016-02-26 MED ORDER — ISOSORBIDE MONONITRATE ER 60 MG PO TB24: 90.0000 mg | ORAL_TABLET | Freq: Every day | ORAL | Status: DC
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
## 2016-02-26 NOTE — ED Notes (Signed)
Pt up and ambulatory to the bathroom with assistance. No c/o CP. Urine obtained and sent to Lab.

## 2016-02-26 NOTE — ED Notes (Signed)
EMS reports pt resident of [facility] and had episode of chest pain, vomiting, and increased fatigue.  Reports Pt's chest felt better after vomiting.  Denies pain at this time.  EMS says they transported pt Tuesday and is more bradycardic today.  Reports pt was admitted [facility] for chest pain and was discharged with her imdur increased since d/c.  Pt very drowsy and falls asleep unless stimulated.

## 2016-02-26 NOTE — Discharge Instructions (Signed)
While your chest pain is concerning for possible heart related pain, our cardiologist did not think you would benefit from coming into the hospital today. Our cardiologist recommend that you stop taking Naprosyn or any other anti-inflammatory medication as that has been related to worsening heart problems. Her cardiologist also recommended that you increase your IMDUR to 90 mg daily. You have a follow-up appointment with [facility] on Friday to discuss further management of your heart problems.    Return without fail for worsening symptoms, including worsening pain, passing out, difficulty breathing, or any other symptoms concerning to you.  Nonspecific Chest Pain It is often hard to find the cause of chest pain. There is always a chance that your pain could be related to something serious, such as a heart attack or a blood clot in your lungs. Chest pain can also be caused by conditions that are not life-threatening. If you have chest pain, it is very important to follow up with your doctor.  HOME CARE  If you were prescribed an antibiotic medicine, finish it all even if you start to feel better.  Avoid any activities that cause chest pain.  Do not use any tobacco products, including cigarettes, chewing tobacco, or electronic cigarettes. If you need help quitting, ask your doctor.  Do not drink alcohol.  Take medicines only as told by your doctor.  Keep all follow-up visits as told by your doctor. This is important. This includes any further testing if your chest pain does not go away.  Your doctor may tell you to keep your head raised (elevated) while you sleep.  Make lifestyle changes as told by your doctor. These may include:  Getting regular exercise. Ask your doctor to suggest some activities that are safe for you.  Eating a heart-healthy diet. Your doctor or a diet specialist (dietitian) can help you to learn healthy eating options.  Maintaining a healthy weight.  Managing diabetes,  if necessary.  Reducing stress. GET HELP IF:  Your chest pain does not go away, even after treatment.  You have a rash with blisters on your chest.  You have a fever. GET HELP RIGHT AWAY IF:  Your chest pain is worse.  You have an increasing cough, or you cough up blood.  You have severe belly (abdominal) pain.  You feel extremely weak.  You pass out (faint).  You have chills.  You have sudden, unexplained chest discomfort.  You have sudden, unexplained discomfort in your arms, back, neck, or jaw.  You have shortness of breath at any time.  You suddenly start to sweat, or your skin gets clammy.  You feel nauseous.  You vomit.  You suddenly feel light-headed or dizzy.  Your heart begins to beat quickly, or it feels like it is skipping beats. These symptoms may be an emergency. Do not wait to see if the symptoms will go away. Get medical help right away. Call your local emergency services (911 in the [place] Do not drive yourself to the hospital.   This information is not intended to replace advice given to you by your health care provider. Make sure you discuss any questions you have with your health care provider.   Document Released: 05/16/2008 Document Revised: 12/19/2014 Document Reviewed: 07/04/2014 Elsevier Interactive Patient Education [employer].

## 2016-02-26 NOTE — ED Notes (Signed)
Report called to [place], Fontaine No, RN.

## 2016-03-04 ENCOUNTER — Ambulatory Visit (INDEPENDENT_AMBULATORY_CARE_PROVIDER_SITE_OTHER): Payer: Medicare Other | Admitting: Adult Health

## 2016-03-04 ENCOUNTER — Encounter: Payer: Self-pay | Admitting: Adult Health

## 2016-03-04 ENCOUNTER — Encounter: Payer: Medicare Other | Admitting: Adult Health

## 2016-03-04 VITALS — BP 118/66 | HR 59 | Ht 65.0 in | Wt 158.0 lb

## 2016-03-04 DIAGNOSIS — R0782 Intercostal pain: Secondary | ICD-10-CM | POA: Diagnosis not present

## 2016-03-04 DIAGNOSIS — I10 Essential (primary) hypertension: Secondary | ICD-10-CM | POA: Diagnosis not present

## 2016-03-04 MED ORDER — ACETAMINOPHEN 325 MG PO TABS: 650.0000 mg | ORAL_TABLET | Freq: Four times a day (QID) | ORAL | Status: DC | PRN

## 2016-03-04 MED ORDER — LISINOPRIL 20 MG PO TABS: 30.0000 mg | ORAL_TABLET | Freq: Every day | ORAL | Status: DC
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
## 2016-03-04 NOTE — Patient Instructions (Addendum)
Your physician recommends that you schedule a follow-up appointment in: 3 Months   Your physician has recommended you make the following change in your medication:   Decrease Lisinopril to 30 mg ( 1 1/2 ) Tablets  Daily   Take Tylenol 650 mg Every 6 Hours as needed for pain.   Please take Nitroglycerin for chest pain prior to being seen in the emergency room.   If you need a refill on your cardiac medications before your next appointment, please call your pharmacy.  Thank you for choosing [facility]!

## 2016-03-04 NOTE — Progress Notes (Signed)
Cardiology Office Note   Date:  03/04/2016   ID:  Melissa Baird, DOB [birth year], MRN [id number] ERROR

## 2016-06-07 ENCOUNTER — Encounter: Payer: Medicare Other | Admitting: Adult Health

## 2016-06-07 NOTE — Progress Notes (Signed)
Cardiology Office Note   Date:  06/07/2016  ERROR Cancelled

## 2016-06-17 ENCOUNTER — Encounter: Payer: Self-pay | Admitting: Adult Health

## 2016-06-17 ENCOUNTER — Ambulatory Visit (INDEPENDENT_AMBULATORY_CARE_PROVIDER_SITE_OTHER): Payer: Medicare Other | Admitting: Adult Health

## 2016-06-17 VITALS — BP 142/80 | HR 61 | Ht 66.0 in | Wt 161.0 lb

## 2016-06-17 DIAGNOSIS — I10 Essential (primary) hypertension: Secondary | ICD-10-CM | POA: Diagnosis not present

## 2016-06-17 DIAGNOSIS — R0600 Dyspnea, unspecified: Secondary | ICD-10-CM | POA: Diagnosis not present

## 2016-06-17 NOTE — Patient Instructions (Signed)
Your physician recommends that you schedule a follow-up appointment in: 3 Months   Your physician recommends that you continue on your current medications as directed. Please refer to the Current Medication list given to you today.  Your physician has requested that you have an echocardiogram. Echocardiography is a painless test that uses sound waves to create images of your heart. It provides your doctor with information about the size and shape of your heart and how well your heart's chambers and valves are working. This procedure takes approximately one hour. There are no restrictions for this procedure.  If you need a refill on your cardiac medications before your next appointment, please call your pharmacy.  Thank you for choosing [facility]!    
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
## 2016-06-17 NOTE — Progress Notes (Signed)
Name: Melissa Baird    DOB: [birth year]  Age: 75 y.o.  MR#: [id number]       PCP:  Maricela Curet, MD      Insurance: Payor: MEDICARE / Plan: MEDICARE PART A AND B / Product Type: *No Product type* /   CC:   No chief complaint on file.   VS Filed Vitals:   06/17/16 1307  Pulse: 61  Height: 5\' 6"  (1.676 m)  Weight: 161 lb (73.029 kg)  SpO2: 92%    Weights Current Weight  06/17/16 161 lb (73.029 kg)  03/04/16 158 lb (71.668 kg)  02/26/16 148 lb (67.132 kg)    Blood Pressure  BP Readings from Last 3 Encounters:  03/04/16 118/66  02/26/16 134/70  02/25/16 160/70     Admit date:  (Not on file) Last encounter with RMR:  03/04/2016   Allergy Review of patient's allergies indicates no known allergies.  Current Outpatient Prescriptions  Medication Sig Dispense Refill  . acetaminophen (TYLENOL) 325 MG tablet Take 2 tablets (650 mg total) by mouth every 6 (six) hours as needed. 240 tablet 6  . alendronate (FOSAMAX) 70 MG tablet Take 70 mg by mouth every 7 (seven) days. Take with a full glass of water on an empty stomach.    Marland Kitchen amLODipine (NORVASC) 10 MG tablet Take 10 mg by mouth daily.      Marland Kitchen aspirin 81 MG chewable tablet Chew 1 tablet (81 mg total) by mouth daily.    Marland Kitchen buPROPion (WELLBUTRIN XL) 300 MG 24 hr tablet Take 300 mg by mouth every morning.    . donepezil (ARICEPT) 10 MG tablet Take 10 mg by mouth at bedtime.    . isosorbide mononitrate (IMDUR) 60 MG 24 hr tablet     . lisinopril (PRINIVIL,ZESTRIL) 20 MG tablet Take 1.5 tablets (30 mg total) by mouth daily. 45 tablet 11  . memantine (NAMENDA) 10 MG tablet Take 10 mg by mouth 2 (two) times daily.    . metoprolol (LOPRESSOR) 50 MG tablet TAKE 1 TABLET BY MOUTH TWICE DAILY. MUST MAKE APPOINTMENT FOR FUTURE REFILLS 30 tablet 0  . nitroGLYCERIN (NITROSTAT) 0.4 MG SL tablet Place 1 tablet (0.4 mg total) under the tongue every 5 (five) minutes as needed for chest pain. 30 tablet 12  . omeprazole (PRILOSEC) 20 MG capsule  Take 20 mg by mouth daily.    Marland Kitchen oxyCODONE (OXY IR/ROXICODONE) 5 MG immediate release tablet Take 5 mg by mouth every 4 (four) hours as needed for severe pain.    . pravastatin (PRAVACHOL) 40 MG tablet Take 40 mg by mouth every evening.     No current facility-administered medications for this visit.    Discontinued Meds:   There are no discontinued medications.  Patient Active Problem List   Diagnosis Date Noted  . Chest pain 02/23/2016  . Dementia   . Unstable angina ([place]) 06/17/2013  . HLD (hyperlipidemia) 06/17/2013  . TOBACCO ABUSE 09/30/2010  . 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 [place] DISEASE, LUMBAR 09/14/2010  . Back pain, chronic 09/14/2010  . Depression 09/03/2010  . Essential hypertension 09/03/2010  . SYNCOPE 09/03/2010    LABS    Component Value Date/Time   NA 137 02/26/2016 1137   NA 138 02/23/2016 1652   NA 141 10/01/2015 0908   K 4.3 02/26/2016 1137   K 3.9 02/23/2016 1652   K 3.8 10/01/2015 0908   CL 101 02/26/2016 1137   CL 105 02/23/2016 1652   CL  105 10/01/2015 0908   CO2 28 02/26/2016 1137   CO2 26 02/23/2016 1652   CO2 28 10/01/2015 0908   GLUCOSE 117* 02/26/2016 1137   GLUCOSE 128* 02/23/2016 1652   GLUCOSE 98 10/01/2015 0908   BUN 29* 02/26/2016 1137   BUN 20 02/23/2016 1652   BUN 16 10/01/2015 0908   CREATININE 1.25* 02/26/2016 1137   CREATININE 0.92 02/23/2016 1652   CREATININE 0.97 10/01/2015 0908   CALCIUM 9.2 02/26/2016 1137   CALCIUM 8.9 02/23/2016 1652   CALCIUM 9.1 10/01/2015 0908   GFRNONAA 42* 02/26/2016 1137   GFRNONAA >60 02/23/2016 1652   GFRNONAA 57* 10/01/2015 0908   GFRAA 48* 02/26/2016 1137   GFRAA >60 02/23/2016 1652   GFRAA >60 10/01/2015 0908   CMP     Component Value Date/Time   NA 137 02/26/2016 1137   K 4.3 02/26/2016 1137   CL 101 02/26/2016 1137   CO2 28 02/26/2016 1137   GLUCOSE 117* 02/26/2016 1137   BUN 29* 02/26/2016 1137   CREATININE 1.25* 02/26/2016 1137   CALCIUM 9.2 02/26/2016 1137    PROT 7.1 02/26/2016 1137   ALBUMIN 4.0 02/26/2016 1137   AST 18 02/26/2016 1137   ALT 15 02/26/2016 1137   ALKPHOS 77 02/26/2016 1137   BILITOT 0.8 02/26/2016 1137   GFRNONAA 42* 02/26/2016 1137   GFRAA 48* 02/26/2016 1137       Component Value Date/Time   WBC 11.5* 02/26/2016 1137   WBC 7.6 02/23/2016 1652   WBC 7.3 10/01/2015 0908   HGB 14.2 02/26/2016 1137   HGB 14.0 02/23/2016 1652   HGB 14.3 10/01/2015 0908   HCT 43.3 02/26/2016 1137   HCT 42.4 02/23/2016 1652   HCT 43.4 10/01/2015 0908   MCV 85.7 02/26/2016 1137   MCV 85.3 02/23/2016 1652   MCV 85.3 10/01/2015 0908    Lipid Panel     Component Value Date/Time   CHOL 123 06/18/2013 0430   TRIG 141 06/18/2013 0430   HDL 35* 06/18/2013 0430   CHOLHDL 3.5 06/18/2013 0430   VLDL 28 06/18/2013 0430   LDLCALC 60 06/18/2013 0430    ABG    Component Value Date/Time   TCO2 25 06/17/2013 1533     Lab Results  Component Value Date   TSH 0.570 09/01/2010   BNP (last 3 results) No results for input(s): BNP in the last 8760 hours.  ProBNP (last 3 results) No results for input(s): PROBNP in the last 8760 hours.  Cardiac Panel (last 3 results) No results for input(s): CKTOTAL, CKMB, TROPONINI, RELINDX in the last 72 hours.  Iron/TIBC/Ferritin/ %Sat No results found for: IRON, TIBC, FERRITIN, IRONPCTSAT   EKG Orders placed or performed during the hospital encounter of 02/26/16  . ED EKG  . ED EKG  . EKG 12-Lead  . EKG 12-Lead  . ED EKG  . ED EKG  . EKG 12-Lead  . EKG 12-Lead  . EKG     Prior Assessment and Plan Problem List as of 06/17/2016      Cardiovascular and Mediastinum   Essential hypertension   Last Assessment & Plan 07/09/2013 Office Visit Written 07/09/2013  2:23 PM by Brett Canales, PA-C    Blood pressure mildly elevated at this time. I will be starting Imdur 30 mg recurrent chest pain which may help her blood pressures as well.      SYNCOPE   Unstable angina [facility])   Last Assessment &  Plan 07/09/2013 Office Visit Written 07/09/2013  2:24 PM by Brett Canales, PA-C    One episode of chest pain associated nausea and diaphoresis while taking a bath. We'll start imdur 30 mg.        Endocrine   BENIGN NEOPLASM OF ADRENAL GLAND     Nervous and Auditory   Dementia     Musculoskeletal and Integument   DISC DISEASE, LUMBAR     Other   Back pain, chronic   TOBACCO ABUSE   Depression   HLD (hyperlipidemia)   Last Assessment & Plan 07/09/2013 Office Visit Edited 07/09/2013  2:25 PM by Brett Canales, PA-C    Treated with pravastatin      Chest pain       Imaging: No results found.

## 2016-07-09 ENCOUNTER — Emergency Department (HOSPITAL_COMMUNITY)
Admission: EM | Admit: 2016-07-09 | Discharge: 2016-07-09 | Disposition: A | Discharge status: Home / Self Care | Payer: Medicare Other | Attending: Emergency Medicine | Admitting: Emergency Medicine

## 2016-07-09 ENCOUNTER — Emergency Department (HOSPITAL_COMMUNITY): Payer: Medicare Other

## 2016-07-09 ENCOUNTER — Encounter (HOSPITAL_COMMUNITY): Payer: Self-pay | Admitting: Emergency Medicine

## 2016-07-09 DIAGNOSIS — I10 Essential (primary) hypertension: Secondary | ICD-10-CM | POA: Insufficient documentation

## 2016-07-09 DIAGNOSIS — F17210 Nicotine dependence, cigarettes, uncomplicated: Secondary | ICD-10-CM | POA: Diagnosis not present

## 2016-07-09 DIAGNOSIS — F329 Major depressive disorder, single episode, unspecified: Secondary | ICD-10-CM | POA: Insufficient documentation

## 2016-07-09 DIAGNOSIS — Z7982 Long term (current) use of aspirin: Secondary | ICD-10-CM | POA: Diagnosis not present

## 2016-07-09 DIAGNOSIS — Z79899 Other long term (current) drug therapy: Secondary | ICD-10-CM | POA: Diagnosis not present

## 2016-07-09 DIAGNOSIS — R079 Chest pain, unspecified: Secondary | ICD-10-CM | POA: Insufficient documentation

## 2016-07-09 DIAGNOSIS — I2510 Atherosclerotic heart disease of native coronary artery without angina pectoris: Secondary | ICD-10-CM | POA: Insufficient documentation

## 2016-07-09 DIAGNOSIS — N390 Urinary tract infection, site not specified: Secondary | ICD-10-CM | POA: Diagnosis not present

## 2016-07-09 LAB — CBC
HEMATOCRIT: 42.2 % (ref 36.0–46.0)
Hemoglobin: 13.6 g/dL (ref 12.0–15.0)
MCH: 27.1 pg (ref 26.0–34.0)
MCHC: 32.2 g/dL (ref 30.0–36.0)
MCV: 84.1 fL (ref 78.0–100.0)
PLATELETS: 269 10*3/uL (ref 150–400)
RBC: 5.02 MIL/uL (ref 3.87–5.11)
RDW: 14.1 % (ref 11.5–15.5)
WBC: 7.5 10*3/uL (ref 4.0–10.5)

## 2016-07-09 LAB — COMPREHENSIVE METABOLIC PANEL
ALBUMIN: 3.9 g/dL (ref 3.5–5.0)
ALT: 16 U/L (ref 14–54)
AST: 19 U/L (ref 15–41)
Alkaline Phosphatase: 94 U/L (ref 38–126)
Anion gap: 4 — ABNORMAL LOW (ref 5–15)
BILIRUBIN TOTAL: 0.4 mg/dL (ref 0.3–1.2)
BUN: 19 mg/dL (ref 6–20)
CHLORIDE: 108 mmol/L (ref 101–111)
CO2: 26 mmol/L (ref 22–32)
CREATININE: 0.87 mg/dL (ref 0.44–1.00)
Calcium: 8.7 mg/dL — ABNORMAL LOW (ref 8.9–10.3)
GFR calc Af Amer: >60 mL/min (ref >60)
GLUCOSE: 99 mg/dL (ref 65–99)
POTASSIUM: 3.9 mmol/L (ref 3.5–5.1)
Sodium: 138 mmol/L (ref 135–145)
Total Protein: 7.3 g/dL (ref 6.5–8.1)

## 2016-07-09 LAB — URINE MICROSCOPIC-ADD ON

## 2016-07-09 LAB — URINALYSIS, ROUTINE W REFLEX MICROSCOPIC
Bilirubin Urine: NEGATIVE
GLUCOSE, UA: NEGATIVE mg/dL
Ketones, ur: NEGATIVE mg/dL
Nitrite: NEGATIVE
PH: 6.5 (ref 5.0–8.0)
SPECIFIC GRAVITY, URINE: 1.02 (ref 1.005–1.030)

## 2016-07-09 LAB — APTT: APTT: 34 s (ref 24–36)

## 2016-07-09 LAB — PROTIME-INR
INR: 0.96
Prothrombin Time: 12.8 seconds (ref 11.4–15.2)

## 2016-07-09 LAB — TROPONIN I: Troponin I: <0.03 ng/mL (ref <0.03)

## 2016-07-09 MED ORDER — CEPHALEXIN 500 MG PO CAPS: 500.0000 mg | ORAL_CAPSULE | Freq: Three times a day (TID) | ORAL | 0 refills | Status: AC

## 2016-07-09 MED ORDER — ASPIRIN 81 MG PO CHEW
324.0000 mg | CHEWABLE_TABLET | Freq: Once | ORAL | Status: AC
  Administered 2016-07-09: 324 mg via ORAL
  Filled 2016-07-09: qty 4

## 2016-07-09 MED ORDER — CEPHALEXIN 500 MG PO CAPS
500.0000 mg | ORAL_CAPSULE | Freq: Once | ORAL | Status: AC
  Administered 2016-07-09: 500 mg via ORAL
  Filled 2016-07-09: qty 1

## 2016-07-09 MED ORDER — NITROGLYCERIN 0.4 MG SL SUBL
0.4000 mg | SUBLINGUAL_TABLET | SUBLINGUAL | Status: DC | PRN
  Administered 2016-07-09: 0.4 mg via SUBLINGUAL
  Filled 2016-07-09: qty 1

## 2016-07-09 NOTE — Discharge Instructions (Signed)
You have a urine infection Cephalexin 3 times daily for 7 days See your doctor for recheck in 7 days Come back to the ER for worsening symptoms.  Please obtain all of your results from medical records or have your doctors office obtain the results - share them with your doctor - you should be seen at your doctors office in the next 2 days. Call today to arrange your follow up. Take the medications as prescribed. Please review all of the medicines and only take them if you do not have an allergy to them. Please be aware that if you are taking birth control pills, taking other prescriptions, ESPECIALLY ANTIBIOTICS may make the birth control ineffective - if this is the case, either do not engage in sexual activity or use alternative methods of birth control such as condoms until you have finished the medicine and your family doctor says it is OK to restart them. If you are on a blood thinner such as COUMADIN, be aware that any other medicine that you take may cause the coumadin to either work too much, or not enough - you should have your coumadin level rechecked in next 7 days if this is the case.  ?  It is also a possibility that you have an allergic reaction to any of the medicines that you have been prescribed - Everybody reacts differently to medications and while MOST people have no trouble with most medicines, you may have a reaction such as nausea, vomiting, rash, swelling, shortness of breath. If this is the case, please stop taking the medicine immediately and contact your physician.  ?  You should return to the ER if you develop severe or worsening symptoms.
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
## 2016-07-09 NOTE — ED Triage Notes (Signed)
Patient brought in [place] from [facility]. Patient c/o left side chest pain with shortness of breath. Patient states she has had inttermittent chest pain x1 month but today is the worst. Per staff patient refused to take SL nitro which she usually takes if she experiences chest pain. Per patient shortness of breath. Patient has hx of heart cath.

## 2016-07-09 NOTE — ED Notes (Signed)
[facility] called to bring patient home.

## 2016-07-11 LAB — URINE CULTURE

## 2016-09-15 ENCOUNTER — Ambulatory Visit: Payer: Medicare Other | Admitting: Adult Health
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
## 2016-09-27 ENCOUNTER — Encounter: Payer: Self-pay | Admitting: Adult Health

## 2016-09-27 ENCOUNTER — Ambulatory Visit (INDEPENDENT_AMBULATORY_CARE_PROVIDER_SITE_OTHER): Payer: Medicare Other | Admitting: Adult Health

## 2016-09-27 VITALS — BP 144/76 | HR 73 | Ht 66.0 in | Wt 155.0 lb

## 2016-09-27 DIAGNOSIS — I10 Essential (primary) hypertension: Secondary | ICD-10-CM

## 2016-09-27 DIAGNOSIS — I2510 Atherosclerotic heart disease of native coronary artery without angina pectoris: Secondary | ICD-10-CM

## 2016-09-27 NOTE — Progress Notes (Signed)
Name: Melissa Baird    DOB: 05-21-1941  Age: 75 y.o.  MR#: [id number]       PCP:  Maricela Curet, MD      Insurance: Payor: MEDICARE / Plan: MEDICARE PART A AND B / Product Type: *No Product type* /   CC:   No chief complaint on file.   VS Vitals:   09/27/16 1348  BP: (!) 144/76  Pulse: 73  SpO2: 94%  Weight: 155 lb (70.3 kg)  Height: 5\' 6"  (1.676 m)    Weights Current Weight  09/27/16 155 lb (70.3 kg)  07/09/16 160 lb (72.6 kg)  06/17/16 161 lb (73 kg)    Blood Pressure  BP Readings from Last 3 Encounters:  09/27/16 (!) 144/76  07/09/16 151/62  06/17/16 (!) 142/80     Admit date:  (Not on file) Last encounter with RMR:  06/17/2016   Allergy Review of patient's allergies indicates no known allergies.  Current Outpatient Prescriptions  Medication Sig Dispense Refill  . acetaminophen (TYLENOL) 325 MG tablet Take 2 tablets (650 mg total) by mouth every 6 (six) hours as needed. (Patient taking differently: Take 650 mg by mouth every 6 (six) hours as needed for moderate pain. ) 240 tablet 6  . alendronate (FOSAMAX) 70 MG tablet Take 70 mg by mouth every 7 (seven) days. Take with a full glass of water on an empty stomach.    Marland Kitchen amLODipine (NORVASC) 10 MG tablet Take 10 mg by mouth daily.      Marland Kitchen aspirin 81 MG chewable tablet Chew 1 tablet (81 mg total) by mouth daily.    Marland Kitchen buPROPion (WELLBUTRIN XL) 300 MG 24 hr tablet Take 300 mg by mouth every morning.    . donepezil (ARICEPT) 10 MG tablet Take 10 mg by mouth daily.     . isosorbide mononitrate (IMDUR) 30 MG 24 hr tablet Take 30 mg by mouth daily.    Marland Kitchen lisinopril (PRINIVIL,ZESTRIL) 20 MG tablet Take 1.5 tablets (30 mg total) by mouth daily. 45 tablet 11  . memantine (NAMENDA) 10 MG tablet Take 10 mg by mouth 2 (two) times daily.    . metoprolol (LOPRESSOR) 50 MG tablet TAKE 1 TABLET BY MOUTH TWICE DAILY. MUST MAKE APPOINTMENT FOR FUTURE REFILLS 30 tablet 0  . nitroGLYCERIN (NITROSTAT) 0.4 MG SL tablet Place 1 tablet (0.4  mg total) under the tongue every 5 (five) minutes as needed for chest pain. 30 tablet 12  . omeprazole (PRILOSEC) 20 MG capsule Take 20 mg by mouth daily.    Marland Kitchen oxyCODONE (OXY IR/ROXICODONE) 5 MG immediate release tablet Take 5 mg by mouth 3 (three) times daily as needed for severe pain.     . pravastatin (PRAVACHOL) 40 MG tablet Take 40 mg by mouth every evening.     No current facility-administered medications for this visit.     Discontinued Meds:   There are no discontinued medications.  Patient Active Problem List   Diagnosis Date Noted  . Chest pain 02/23/2016  . Dementia   . Unstable angina ([place]) 06/17/2013  . HLD (hyperlipidemia) 06/17/2013  . TOBACCO ABUSE 09/30/2010  . 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 [place] DISEASE, LUMBAR 09/14/2010  . Back pain, chronic 09/14/2010  . Depression 09/03/2010  . Essential hypertension 09/03/2010  . SYNCOPE 09/03/2010    LABS    Component Value Date/Time   NA 138 07/09/2016 1040   NA 137 02/26/2016 1137   NA 138 02/23/2016 1652   K  3.9 07/09/2016 1040   K 4.3 02/26/2016 1137   K 3.9 02/23/2016 1652   CL 108 07/09/2016 1040   CL 101 02/26/2016 1137   CL 105 02/23/2016 1652   CO2 26 07/09/2016 1040   CO2 28 02/26/2016 1137   CO2 26 02/23/2016 1652   GLUCOSE 99 07/09/2016 1040   GLUCOSE 117 (H) 02/26/2016 1137   GLUCOSE 128 (H) 02/23/2016 1652   BUN 19 07/09/2016 1040   BUN 29 (H) 02/26/2016 1137   BUN 20 02/23/2016 1652   CREATININE 0.87 07/09/2016 1040   CREATININE 1.25 (H) 02/26/2016 1137   CREATININE 0.92 02/23/2016 1652   CALCIUM 8.7 (L) 07/09/2016 1040   CALCIUM 9.2 02/26/2016 1137   CALCIUM 8.9 02/23/2016 1652   GFRNONAA >60 07/09/2016 1040   GFRNONAA 42 (L) 02/26/2016 1137   GFRNONAA >60 02/23/2016 1652   GFRAA >60 07/09/2016 1040   GFRAA 48 (L) 02/26/2016 1137   GFRAA >60 02/23/2016 1652   CMP     Component Value Date/Time   NA 138 07/09/2016 1040   K 3.9 07/09/2016 1040   CL 108 07/09/2016  1040   CO2 26 07/09/2016 1040   GLUCOSE 99 07/09/2016 1040   BUN 19 07/09/2016 1040   CREATININE 0.87 07/09/2016 1040   CALCIUM 8.7 (L) 07/09/2016 1040   PROT 7.3 07/09/2016 1040   ALBUMIN 3.9 07/09/2016 1040   AST 19 07/09/2016 1040   ALT 16 07/09/2016 1040   ALKPHOS 94 07/09/2016 1040   BILITOT 0.4 07/09/2016 1040   GFRNONAA >60 07/09/2016 1040   GFRAA >60 07/09/2016 1040       Component Value Date/Time   WBC 7.5 07/09/2016 1040   WBC 11.5 (H) 02/26/2016 1137   WBC 7.6 02/23/2016 1652   HGB 13.6 07/09/2016 1040   HGB 14.2 02/26/2016 1137   HGB 14.0 02/23/2016 1652   HCT 42.2 07/09/2016 1040   HCT 43.3 02/26/2016 1137   HCT 42.4 02/23/2016 1652   MCV 84.1 07/09/2016 1040   MCV 85.7 02/26/2016 1137   MCV 85.3 02/23/2016 1652    Lipid Panel     Component Value Date/Time   CHOL 123 06/18/2013 0430   TRIG 141 06/18/2013 0430   HDL 35 (L) 06/18/2013 0430   CHOLHDL 3.5 06/18/2013 0430   VLDL 28 06/18/2013 0430   LDLCALC 60 06/18/2013 0430    ABG    Component Value Date/Time   TCO2 25 06/17/2013 1533     Lab Results  Component Value Date   TSH 0.570 09/01/2010   BNP (last 3 results) No results for input(s): BNP in the last 8760 hours.  ProBNP (last 3 results) No results for input(s): PROBNP in the last 8760 hours.  Cardiac Panel (last 3 results) No results for input(s): CKTOTAL, CKMB, TROPONINI, RELINDX in the last 72 hours.  Iron/TIBC/Ferritin/ %Sat No results found for: IRON, TIBC, FERRITIN, IRONPCTSAT   EKG Orders placed or performed during the hospital encounter of 07/09/16  . EKG 12-Lead  . EKG 12-Lead  . ED EKG  . ED EKG  . EKG     Prior Assessment and Plan Problem List as of 09/27/2016 Reviewed: 06/17/2016  1:37 PM by Jory Sims, NP     Cardiovascular and Mediastinum   Essential hypertension   Last Assessment & Plan 07/09/2013 Office Visit Written 07/09/2013  2:23 PM by Brett Canales, PA-C    Blood pressure mildly elevated at this time.  I will be starting Imdur 30 mg recurrent  chest pain which may help her blood pressures as well.      SYNCOPE   Unstable angina [facility])   Last Assessment & Plan 07/09/2013 Office Visit Written 07/09/2013  2:24 PM by Brett Canales, PA-C    One episode of chest pain associated nausea and diaphoresis while taking a bath. We'll start imdur 30 mg.        Endocrine   BENIGN NEOPLASM OF ADRENAL GLAND     Nervous and Auditory   Dementia     Musculoskeletal and Integument   DISC DISEASE, LUMBAR     Other   Back pain, chronic   TOBACCO ABUSE   Depression   HLD (hyperlipidemia)   Last Assessment & Plan 07/09/2013 Office Visit Edited 07/09/2013  2:25 PM by Brett Canales, PA-C    Treated with pravastatin      Chest pain       Imaging: No results found.
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
## 2016-09-27 NOTE — Patient Instructions (Signed)
Your physician recommends that you schedule a follow-up appointment in: 3 Months   Your physician recommends that you continue on your current medications as directed. Please refer to the Current Medication list given to you today.  Your physician has requested that you have an echocardiogram. Echocardiography is a painless test that uses sound waves to create images of your heart. It provides your doctor with information about the size and shape of your heart and how well your heart's chambers and valves are working. This procedure takes approximately one hour. There are no restrictions for this procedure.  If you need a refill on your cardiac medications before your next appointment, please call your pharmacy.  Thank you for choosing [facility]!    

## 2016-10-04 ENCOUNTER — Emergency Department (HOSPITAL_COMMUNITY): Payer: Medicare Other

## 2016-10-04 ENCOUNTER — Encounter (HOSPITAL_COMMUNITY): Payer: Self-pay | Admitting: Emergency Medicine

## 2016-10-04 ENCOUNTER — Emergency Department (HOSPITAL_COMMUNITY)
Admission: EM | Admit: 2016-10-04 | Discharge: 2016-10-04 | Disposition: A | Discharge status: Home / Self Care | Payer: Medicare Other | Attending: Emergency Medicine | Admitting: Emergency Medicine

## 2016-10-04 DIAGNOSIS — Z79899 Other long term (current) drug therapy: Secondary | ICD-10-CM | POA: Insufficient documentation

## 2016-10-04 DIAGNOSIS — I10 Essential (primary) hypertension: Secondary | ICD-10-CM | POA: Insufficient documentation

## 2016-10-04 DIAGNOSIS — N390 Urinary tract infection, site not specified: Secondary | ICD-10-CM | POA: Diagnosis not present

## 2016-10-04 DIAGNOSIS — Z87891 Personal history of nicotine dependence: Secondary | ICD-10-CM | POA: Diagnosis not present

## 2016-10-04 DIAGNOSIS — R112 Nausea with vomiting, unspecified: Secondary | ICD-10-CM

## 2016-10-04 DIAGNOSIS — I2510 Atherosclerotic heart disease of native coronary artery without angina pectoris: Secondary | ICD-10-CM | POA: Insufficient documentation

## 2016-10-04 LAB — CBC
HCT: 42.2 % (ref 36.0–46.0)
Hemoglobin: 14 g/dL (ref 12.0–15.0)
MCH: 27.8 pg (ref 26.0–34.0)
MCHC: 33.2 g/dL (ref 30.0–36.0)
MCV: 83.9 fL (ref 78.0–100.0)
PLATELETS: 266 10*3/uL (ref 150–400)
RBC: 5.03 MIL/uL (ref 3.87–5.11)
RDW: 14.4 % (ref 11.5–15.5)
WBC: 9.9 10*3/uL (ref 4.0–10.5)

## 2016-10-04 LAB — URINE MICROSCOPIC-ADD ON: RBC / HPF: NONE SEEN RBC/hpf (ref 0–5)

## 2016-10-04 LAB — URINALYSIS, ROUTINE W REFLEX MICROSCOPIC
BILIRUBIN URINE: NEGATIVE
Glucose, UA: NEGATIVE mg/dL
HGB URINE DIPSTICK: NEGATIVE
Nitrite: NEGATIVE
PROTEIN: NEGATIVE mg/dL
Specific Gravity, Urine: 1.025 (ref 1.005–1.030)
pH: 6 (ref 5.0–8.0)

## 2016-10-04 LAB — COMPREHENSIVE METABOLIC PANEL
ALK PHOS: 77 U/L (ref 38–126)
ALT: 18 U/L (ref 14–54)
AST: 20 U/L (ref 15–41)
Albumin: 4.1 g/dL (ref 3.5–5.0)
Anion gap: 7 (ref 5–15)
BILIRUBIN TOTAL: 0.7 mg/dL (ref 0.3–1.2)
BUN: 22 mg/dL — ABNORMAL HIGH (ref 6–20)
CALCIUM: 9.1 mg/dL (ref 8.9–10.3)
CO2: 26 mmol/L (ref 22–32)
CREATININE: 1.12 mg/dL — AB (ref 0.44–1.00)
Chloride: 102 mmol/L (ref 101–111)
GFR, EST AFRICAN AMERICAN: 55 mL/min — AB (ref >60)
GFR, EST NON AFRICAN AMERICAN: 47 mL/min — AB (ref >60)
Glucose, Bld: 117 mg/dL — ABNORMAL HIGH (ref 65–99)
Potassium: 4.3 mmol/L (ref 3.5–5.1)
Sodium: 135 mmol/L (ref 135–145)
Total Protein: 7.5 g/dL (ref 6.5–8.1)

## 2016-10-04 LAB — TROPONIN I

## 2016-10-04 LAB — LIPASE, BLOOD: Lipase: 16 U/L (ref 11–51)

## 2016-10-04 MED ORDER — CEPHALEXIN 500 MG PO CAPS
500.0000 mg | ORAL_CAPSULE | Freq: Four times a day (QID) | ORAL | 0 refills | Status: DC
Start: 2016-10-04 — End: 2016-10-13

## 2016-10-04 MED ORDER — ONDANSETRON HCL 4 MG PO TABS: 4.0000 mg | ORAL_TABLET | Freq: Three times a day (TID) | ORAL | 0 refills | Status: DC | PRN

## 2016-10-04 NOTE — ED Notes (Signed)
Pt given sprite to drink. 

## 2016-10-04 NOTE — ED Notes (Signed)
Pt tolerating sprite.

## 2016-10-04 NOTE — ED Triage Notes (Signed)
Pt was eating lunch, had an episode of emesis. Caregiver reports pt became pale and clammy.

## 2016-10-04 NOTE — Discharge Instructions (Signed)
Take the prescriptions as directed.  Increase your fluid intake (ie:  Gatoraide) for the next few days.  Eat a bland diet and advance to your regular diet slowly as you can tolerate it. Call your regular medical doctor tomorrow to schedule a follow up appointment in the next 2 to 3 days.  Return to the Emergency Department immediately if not improving (or even worsening) despite taking the medicines as prescribed, any black or bloody stool or vomit, if you develop a fever over "101," or for any other concerns.

## 2016-10-04 NOTE — ED Notes (Signed)
Pt made aware to return if symptoms worsen or if any life threatening symptoms occur.   
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
## 2016-10-04 NOTE — ED Notes (Signed)
Notified [facility] staff that pt was up for discharge.  They will be sending a staff member soon to pick up pt.

## 2016-10-06 ENCOUNTER — Other Ambulatory Visit (HOSPITAL_COMMUNITY): Payer: Medicare Other

## 2016-10-06 LAB — URINE CULTURE

## 2016-10-12 ENCOUNTER — Encounter (HOSPITAL_COMMUNITY): Payer: Self-pay | Admitting: Internal Medicine

## 2016-10-12 ENCOUNTER — Encounter (HOSPITAL_COMMUNITY): Payer: Self-pay | Admitting: General Practice

## 2016-10-12 ENCOUNTER — Emergency Department (HOSPITAL_COMMUNITY): Payer: Medicare Other

## 2016-10-12 ENCOUNTER — Encounter (HOSPITAL_COMMUNITY): Payer: Self-pay | Admitting: Emergency Medicine

## 2016-10-12 ENCOUNTER — Observation Stay (HOSPITAL_COMMUNITY)
Admission: EM | Admit: 2016-10-12 | Discharge: 2016-10-13 | Disposition: A | Discharge status: Skilled Nursing Facility | Payer: Medicare Other | Attending: Infectious Diseases | Admitting: Infectious Diseases

## 2016-10-12 ENCOUNTER — Ambulatory Visit (HOSPITAL_COMMUNITY)
Admission: EM | Admit: 2016-10-12 | Discharge: 2016-10-12 | Disposition: A | Discharge status: Other Acute Inpatient Hospital | Payer: Medicare Other | Source: Ambulatory Visit | Attending: Cardiovascular Disease | Admitting: Cardiovascular Disease

## 2016-10-12 ENCOUNTER — Encounter (HOSPITAL_COMMUNITY)
Admission: EM | Disposition: A | Discharge status: Other Acute Inpatient Hospital | Payer: Self-pay | Source: Ambulatory Visit | Attending: Cardiovascular Disease

## 2016-10-12 DIAGNOSIS — I2510 Atherosclerotic heart disease of native coronary artery without angina pectoris: Secondary | ICD-10-CM | POA: Diagnosis not present

## 2016-10-12 DIAGNOSIS — I952 Hypotension due to drugs: Secondary | ICD-10-CM

## 2016-10-12 DIAGNOSIS — R4020 Unspecified coma: Secondary | ICD-10-CM | POA: Diagnosis present

## 2016-10-12 DIAGNOSIS — I10 Essential (primary) hypertension: Secondary | ICD-10-CM | POA: Diagnosis not present

## 2016-10-12 DIAGNOSIS — G8929 Other chronic pain: Secondary | ICD-10-CM | POA: Diagnosis not present

## 2016-10-12 DIAGNOSIS — G309 Alzheimer's disease, unspecified: Secondary | ICD-10-CM | POA: Diagnosis not present

## 2016-10-12 DIAGNOSIS — M5136 Other intervertebral disc degeneration, lumbar region: Secondary | ICD-10-CM | POA: Diagnosis not present

## 2016-10-12 DIAGNOSIS — F329 Major depressive disorder, single episode, unspecified: Secondary | ICD-10-CM | POA: Diagnosis not present

## 2016-10-12 DIAGNOSIS — Z823 Family history of stroke: Secondary | ICD-10-CM | POA: Insufficient documentation

## 2016-10-12 DIAGNOSIS — Z818 Family history of other mental and behavioral disorders: Secondary | ICD-10-CM | POA: Insufficient documentation

## 2016-10-12 DIAGNOSIS — R55 Syncope and collapse: Secondary | ICD-10-CM | POA: Diagnosis present

## 2016-10-12 DIAGNOSIS — Z87440 Personal history of urinary (tract) infections: Secondary | ICD-10-CM

## 2016-10-12 DIAGNOSIS — M549 Dorsalgia, unspecified: Secondary | ICD-10-CM

## 2016-10-12 DIAGNOSIS — I440 Atrioventricular block, first degree: Secondary | ICD-10-CM | POA: Insufficient documentation

## 2016-10-12 DIAGNOSIS — E785 Hyperlipidemia, unspecified: Secondary | ICD-10-CM | POA: Diagnosis not present

## 2016-10-12 DIAGNOSIS — Z981 Arthrodesis status: Secondary | ICD-10-CM | POA: Diagnosis not present

## 2016-10-12 DIAGNOSIS — Z7982 Long term (current) use of aspirin: Secondary | ICD-10-CM | POA: Diagnosis not present

## 2016-10-12 DIAGNOSIS — I959 Hypotension, unspecified: Secondary | ICD-10-CM | POA: Insufficient documentation

## 2016-10-12 DIAGNOSIS — Z87891 Personal history of nicotine dependence: Secondary | ICD-10-CM | POA: Insufficient documentation

## 2016-10-12 DIAGNOSIS — Z801 Family history of malignant neoplasm of trachea, bronchus and lung: Secondary | ICD-10-CM | POA: Insufficient documentation

## 2016-10-12 DIAGNOSIS — R001 Bradycardia, unspecified: Secondary | ICD-10-CM | POA: Diagnosis not present

## 2016-10-12 DIAGNOSIS — F17211 Nicotine dependence, cigarettes, in remission: Secondary | ICD-10-CM

## 2016-10-12 DIAGNOSIS — F0280 Dementia in other diseases classified elsewhere without behavioral disturbance: Secondary | ICD-10-CM | POA: Insufficient documentation

## 2016-10-12 DIAGNOSIS — Z87828 Personal history of other (healed) physical injury and trauma: Secondary | ICD-10-CM

## 2016-10-12 DIAGNOSIS — Z79899 Other long term (current) drug therapy: Secondary | ICD-10-CM | POA: Diagnosis not present

## 2016-10-12 DIAGNOSIS — Z833 Family history of diabetes mellitus: Secondary | ICD-10-CM | POA: Insufficient documentation

## 2016-10-12 DIAGNOSIS — Z79891 Long term (current) use of opiate analgesic: Secondary | ICD-10-CM | POA: Diagnosis not present

## 2016-10-12 DIAGNOSIS — I639 Cerebral infarction, unspecified: Secondary | ICD-10-CM | POA: Diagnosis not present

## 2016-10-12 DIAGNOSIS — Z8249 Family history of ischemic heart disease and other diseases of the circulatory system: Secondary | ICD-10-CM

## 2016-10-12 HISTORY — DX: Urinary tract infection, site not specified: N39.0

## 2016-10-12 HISTORY — DX: Low back pain: M54.5

## 2016-10-12 HISTORY — DX: Low back pain, unspecified: M54.50

## 2016-10-12 HISTORY — DX: Gastro-esophageal reflux disease without esophagitis: K21.9

## 2016-10-12 HISTORY — DX: Unspecified osteoarthritis, unspecified site: M19.90

## 2016-10-12 HISTORY — DX: Other chronic pain: G89.29

## 2016-10-12 HISTORY — DX: Chronic obstructive pulmonary disease, unspecified: J44.9

## 2016-10-12 HISTORY — DX: Alzheimer's disease, unspecified: G30.9

## 2016-10-12 HISTORY — DX: Anxiety disorder, unspecified: F41.9

## 2016-10-12 HISTORY — DX: Dementia in other diseases classified elsewhere, unspecified severity, without behavioral disturbance, psychotic disturbance, mood disturbance, and anxiety: F02.80

## 2016-10-12 LAB — URINALYSIS, ROUTINE W REFLEX MICROSCOPIC
Bilirubin Urine: NEGATIVE
GLUCOSE, UA: NEGATIVE mg/dL
Hgb urine dipstick: NEGATIVE
KETONES UR: NEGATIVE mg/dL
LEUKOCYTES UA: NEGATIVE
NITRITE: NEGATIVE
PROTEIN: NEGATIVE mg/dL
Specific Gravity, Urine: 1.018 (ref 1.005–1.030)
pH: 7 (ref 5.0–8.0)

## 2016-10-12 LAB — CBC WITH DIFFERENTIAL/PLATELET
BASOS PCT: 0 %
Basophils Absolute: 0 10*3/uL (ref 0.0–0.1)
Eosinophils Absolute: 0.1 10*3/uL (ref 0.0–0.7)
Eosinophils Relative: 1 %
HEMATOCRIT: 41.9 % (ref 36.0–46.0)
HEMOGLOBIN: 13.8 g/dL (ref 12.0–15.0)
LYMPHS ABS: 1.9 10*3/uL (ref 0.7–4.0)
Lymphocytes Relative: 22 %
MCH: 27.4 pg (ref 26.0–34.0)
MCHC: 32.9 g/dL (ref 30.0–36.0)
MCV: 83.3 fL (ref 78.0–100.0)
MONO ABS: 0.4 10*3/uL (ref 0.1–1.0)
MONOS PCT: 5 %
NEUTROS ABS: 6.4 10*3/uL (ref 1.7–7.7)
NEUTROS PCT: 72 %
Platelets: 228 10*3/uL (ref 150–400)
RBC: 5.03 MIL/uL (ref 3.87–5.11)
RDW: 14.6 % (ref 11.5–15.5)
WBC: 8.9 10*3/uL (ref 4.0–10.5)

## 2016-10-12 LAB — COMPREHENSIVE METABOLIC PANEL
ALBUMIN: 3.3 g/dL — AB (ref 3.5–5.0)
ALK PHOS: 65 U/L (ref 38–126)
ALT: 16 U/L (ref 14–54)
ANION GAP: 9 (ref 5–15)
AST: 23 U/L (ref 15–41)
BILIRUBIN TOTAL: 0.6 mg/dL (ref 0.3–1.2)
BUN: 15 mg/dL (ref 6–20)
CALCIUM: 9.3 mg/dL (ref 8.9–10.3)
CO2: 22 mmol/L (ref 22–32)
Chloride: 105 mmol/L (ref 101–111)
Creatinine, Ser: 1.12 mg/dL — ABNORMAL HIGH (ref 0.44–1.00)
GFR, EST AFRICAN AMERICAN: 55 mL/min — AB (ref >60)
GFR, EST NON AFRICAN AMERICAN: 47 mL/min — AB (ref >60)
GLUCOSE: 143 mg/dL — AB (ref 65–99)
POTASSIUM: 3.9 mmol/L (ref 3.5–5.1)
Sodium: 136 mmol/L (ref 135–145)
TOTAL PROTEIN: 5.9 g/dL — AB (ref 6.5–8.1)

## 2016-10-12 LAB — CREATININE, SERUM
Creatinine, Ser: 1.08 mg/dL — ABNORMAL HIGH (ref 0.44–1.00)
GFR calc Af Amer: 57 mL/min — ABNORMAL LOW (ref >60)
GFR, EST NON AFRICAN AMERICAN: 49 mL/min — AB (ref >60)

## 2016-10-12 LAB — CBC
HEMATOCRIT: 41.4 % (ref 36.0–46.0)
HEMOGLOBIN: 13.6 g/dL (ref 12.0–15.0)
MCH: 27 pg (ref 26.0–34.0)
MCHC: 32.9 g/dL (ref 30.0–36.0)
MCV: 82.3 fL (ref 78.0–100.0)
Platelets: 263 10*3/uL (ref 150–400)
RBC: 5.03 MIL/uL (ref 3.87–5.11)
RDW: 14.5 % (ref 11.5–15.5)
WBC: 6.6 10*3/uL (ref 4.0–10.5)

## 2016-10-12 LAB — RAPID URINE DRUG SCREEN, HOSP PERFORMED
AMPHETAMINES: NOT DETECTED
BENZODIAZEPINES: NOT DETECTED
Barbiturates: NOT DETECTED
Cocaine: NOT DETECTED
OPIATES: POSITIVE — AB
Tetrahydrocannabinol: NOT DETECTED

## 2016-10-12 LAB — ETHANOL

## 2016-10-12 SURGERY — LEFT HEART CATH AND CORONARY ANGIOGRAPHY
Anesthesia: LOCAL

## 2016-10-12 MED ORDER — DEXTROSE-NACL 5-0.45 % IV SOLN
INTRAVENOUS | Status: AC
  Administered 2016-10-12: 22:00:00 via INTRAVENOUS

## 2016-10-12 MED ORDER — NALOXONE HCL 2 MG/2ML IJ SOSY
PREFILLED_SYRINGE | INTRAMUSCULAR | Status: AC
  Filled 2016-10-12: qty 2

## 2016-10-12 MED ORDER — ACETAMINOPHEN 325 MG PO TABS: 650.0000 mg | ORAL_TABLET | Freq: Four times a day (QID) | ORAL | Status: DC | PRN

## 2016-10-12 MED ORDER — NALOXONE HCL 2 MG/2ML IJ SOSY
1.0000 mg | PREFILLED_SYRINGE | Freq: Once | INTRAMUSCULAR | Status: AC
  Administered 2016-10-12: 1 mg via INTRAVENOUS
  Filled 2016-10-12: qty 2

## 2016-10-12 MED ORDER — ONDANSETRON HCL 4 MG PO TABS: 4.0000 mg | ORAL_TABLET | Freq: Three times a day (TID) | ORAL | Status: DC | PRN

## 2016-10-12 MED ORDER — MEMANTINE HCL 10 MG PO TABS
10.0000 mg | ORAL_TABLET | Freq: Two times a day (BID) | ORAL | Status: DC
  Administered 2016-10-12 – 2016-10-13 (×2): 10 mg via ORAL
  Filled 2016-10-12 (×2): qty 1

## 2016-10-12 MED ORDER — KETOROLAC TROMETHAMINE 15 MG/ML IJ SOLN: 15.0000 mg | Freq: Four times a day (QID) | INTRAMUSCULAR | Status: DC | PRN

## 2016-10-12 MED ORDER — DONEPEZIL HCL 10 MG PO TABS
10.0000 mg | ORAL_TABLET | Freq: Every day | ORAL | Status: DC
  Administered 2016-10-13: 10 mg via ORAL
  Filled 2016-10-12: qty 1

## 2016-10-12 MED ORDER — POLYETHYLENE GLYCOL 3350 17 G PO PACK: 17.0000 g | PACK | Freq: Every day | ORAL | Status: DC | PRN

## 2016-10-12 MED ORDER — LISINOPRIL 20 MG PO TABS
30.0000 mg | ORAL_TABLET | Freq: Every day | ORAL | Status: DC
  Administered 2016-10-12 – 2016-10-13 (×2): 30 mg via ORAL
  Filled 2016-10-12 (×2): qty 1

## 2016-10-12 MED ORDER — ASPIRIN 81 MG PO CHEW
81.0000 mg | CHEWABLE_TABLET | Freq: Every day | ORAL | Status: DC
  Administered 2016-10-13: 81 mg via ORAL
  Filled 2016-10-12: qty 1

## 2016-10-12 MED ORDER — ENOXAPARIN SODIUM 40 MG/0.4ML ~~LOC~~ SOLN
40.0000 mg | SUBCUTANEOUS | Status: DC
  Administered 2016-10-13: 40 mg via SUBCUTANEOUS
  Filled 2016-10-12: qty 0.4

## 2016-10-12 NOTE — H&P (Signed)
Date: 10/12/2016               Patient Name:  Melissa Baird MRN: [id number]  DOB: 1941-01-27 Age / Sex: 75 y.o., female   PCP: Melissa Gaskins, MD         Medical Service: Internal Medicine Teaching Service         Attending Physician: Dr. Bobby Baird    First Contact: Dr. Inda Baird Pager: [phone]  Second Contact: Dr. Benjamine Baird Pager: [phone]       After Hours (After 5p/  First Contact Pager: [phone]  weekends / holidays): Second Contact Pager: [phone]   Chief Complaint: found down at nursing facility  History of Present Illness: Melissa Baird is a 75 year old woman with history of HTN, HL, stable angina, and dementia who was brought to the ED after being found nonresponsive at her nursing facility.  Per ED triage notes, she was brought in by EMS after being found on the floor of her nursing facility. History provided by daughter Melissa Baird.  Her daughter visits her 1-2 times per week in her nursing facility, and has noticed that she has been less alert for the past 2 weeks.  She had a recent UTI and completed course of cephalexin.  She complains of chronic back pain, but her daughter does not suspect any other acute illness or injury, though her mother often doesn't complain.  In the ED she was found to be hypothermic, bradycardic, and hypotensive with O2 saturation of 92% on room air.  She received naloxone with some improvement in her mental status and respiratory rate and was actively warmed.  Meds:  Current Meds  Medication Sig  . alendronate (FOSAMAX) 70 MG tablet Take 70 mg by mouth every 7 (seven) days. Take with a full glass of water on an empty stomach.  Marland Kitchen amLODipine (NORVASC) 10 MG tablet Take 10 mg by mouth daily.    Marland Kitchen aspirin 81 MG chewable tablet Chew 1 tablet (81 mg total) by mouth daily.  Marland Kitchen buPROPion (WELLBUTRIN XL) 300 MG 24 hr tablet Take 300 mg by mouth every morning.  . cephALEXin (KEFLEX) 500 MG capsule Take 1 capsule (500 mg total) by mouth 4 (four)  times daily.  . Cholecalciferol (VITAMIN D3) 5000 units CAPS Take 5,000 Units by mouth daily.   Marland Kitchen donepezil (ARICEPT) 10 MG tablet Take 10 mg by mouth daily.   . isosorbide mononitrate (IMDUR) 30 MG 24 hr tablet Take 90 mg by mouth daily.   Marland Kitchen lisinopril (PRINIVIL,ZESTRIL) 20 MG tablet Take 1.5 tablets (30 mg total) by mouth daily.  . memantine (NAMENDA) 10 MG tablet Take 10 mg by mouth 2 (two) times daily.  . metoprolol (LOPRESSOR) 50 MG tablet TAKE 1 TABLET BY MOUTH TWICE DAILY. MUST MAKE APPOINTMENT FOR FUTURE REFILLS (Patient taking differently: TAKE 50 MG BY MOUTH TWICE DAILY. MUST MAKE APPOINTMENT FOR FUTURE REFILLS)  . omeprazole (PRILOSEC) 20 MG capsule Take 20 mg by mouth daily.  Marland Kitchen oxyCODONE (OXY IR/ROXICODONE) 5 MG immediate release tablet Take 5 mg by mouth 3 (three) times daily.   . pravastatin (PRAVACHOL) 40 MG tablet Take 40 mg by mouth every evening.     Allergies: Allergies as of 10/12/2016  . (No Known Allergies)   Past Medical History:  Diagnosis Date  . CAD (coronary artery disease)    Moderate nonobstructive disease 2014 - Dr. Gwenlyn Found  . DDD (degenerative disc disease), lumbar   . Dementia   . Depression   .  Essential hypertension   . Hyperlipidemia   . Syncope     Family History:  Family History  Problem Relation Age of Onset  . Stroke Mother   . Dementia Mother   . Lung cancer Father     non-smoker  . Stroke Daughter   . Hypertension Daughter   . Diabetes Son    Social History:   Social History Main Topics  . Smoking status: Former Smoker    Years: 54.00    Types: Cigarettes    Quit date: 07/12/2016  . Smokeless tobacco: Never Used  . Alcohol use No  . Drug use: No  . Sexual activity: Not on file   Review of Systems: Unable to perform ROS due to dementia.  Physical Exam: Blood pressure (!) 148/99, pulse 70, temperature 97.7 F (36.5 C), temperature source Oral, resp. rate 18, SpO2 98 %.   Physical Exam  Constitutional: She appears  well-developed and well-nourished. No distress.  HENT:  Mouth/Throat: Oropharynx is clear and moist. No oropharyngeal exudate.  Eyes: Conjunctivae are normal. No scleral icterus.  Neck: Normal range of motion. Neck supple.  Cardiovascular: Normal rate and regular rhythm.   Pulmonary/Chest: Effort normal and breath sounds normal. She has no rales.  Abdominal: Soft. She exhibits no distension. There is no tenderness.  Musculoskeletal: She exhibits no edema or deformity.  Neurological:  Alert, oriented only to self Follows simple commands, but exam limited by cooperation Voluntarily moves all limbs equally Visual fields full to threat PERRL No facial droop  Skin: Skin is warm and dry.  Psychiatric:  Calm, cooperative, pleasant Answers questions with few words    EKG: sinus bradycardia, 1st degree AV block, Q waves in I and aVL, nonspecific ST changes  CT Head without Contrast: IMPRESSION: Atrophy with small vessel chronic ischemic changes of deep cerebral white matter.  Small age-indeterminate lacunar infarct RIGHT caudate, new since 2011.  CBC Latest Ref Rng & Units 10/12/2016 10/04/2016 07/09/2016  WBC 4.0 - 10.5 K/uL 8.9 9.9 7.5  Hemoglobin 12.0 - 15.0 g/dL 13.8 14.0 13.6  Hematocrit 36.0 - 46.0 % 41.9 42.2 42.2  Platelets 150 - 400 K/uL 228 266 269   CMP Latest Ref Rng & Units 10/12/2016 10/04/2016 07/09/2016  Glucose 65 - 99 mg/dL 143(H) 117(H) 99  BUN 6 - 20 mg/dL 15 22(H) 19  Creatinine 0.44 - 1.00 mg/dL 1.12(H) 1.12(H) 0.87  Sodium 135 - 145 mmol/L 136 135 138  Potassium 3.5 - 5.1 mmol/L 3.9 4.3 3.9  Chloride 101 - 111 mmol/L 105 102 108  CO2 22 - 32 mmol/L 22 26 26   Calcium 8.9 - 10.3 mg/dL 9.3 9.1 8.7(L)  Total Protein 6.5 - 8.1 g/dL 5.9(L) 7.5 7.3  Total Bilirubin 0.3 - 1.2 mg/dL 0.6 0.7 0.4  Alkaline Phos 38 - 126 U/L 65 77 94  AST 15 - 41 U/L 23 20 19   ALT 14 - 54 U/L 16 18 16     Urinalysis    Component Value Date/Time   COLORURINE YELLOW 10/12/2016  [place] 10/12/2016 1354   LABSPEC 1.018 10/12/2016 1354   PHURINE 7.0 10/12/2016 1354   GLUCOSEU NEGATIVE 10/12/2016 1354   HGBUR NEGATIVE 10/12/2016 1354   BILIRUBINUR NEGATIVE 10/12/2016 1354   KETONESUR NEGATIVE 10/12/2016 1354   PROTEINUR NEGATIVE 10/12/2016 1354   UROBILINOGEN 0.2 10/01/2015 1004   NITRITE NEGATIVE 10/12/2016 1354   LEUKOCYTESUR NEGATIVE 10/12/2016 1354     Assessment & Plan by Problem:  #Loss of Consciousness Likely  contributions of opiate overdose and polypharmacy with response to Narcan.  However, broad differential includes orthostatic hypotension, arrythmia, MI, stroke.  Normal glucose excludes hypoglycemia.  Nonfocal neurologic exam, though limited by dementia, does not suggest acute stroke.  She denies any symptoms, including chest pain, cough, shortness of breath, and dysuria, but is not a reliable informant due to dementia.  No source of infection and her vitals could be explained by oversedation.  Will hold nonessential meds, reassess mental status, and restart as needed. -Trend troponins -Hold oxycodone, amlodipine, bupropion, imdur, metoprolol, omeprazole, pravastatin, nitroglycerin  #HTN -Continue lisinopril 30 mg -Monitor BP and resume amlodipine, metoprolol, imdur as needed  #Chronic Back Pain -Tylenol PRN -Hold opiates  #Dementia -Continue donepezil, memantine  #Goals of Care Full code for now, but raised issue of code status and advanced care planning with daughter.  Continue to discuss.  Dispo: Admit patient to Observation with expected length of stay less than 2 midnights.  Signed: Minus Liberty, MD 10/12/2016, 6:27 PM  Pager: [phone]
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
## 2016-10-12 NOTE — Progress Notes (Signed)
Patient arrived to unit, vitals stable, daughter at bedside, IV on left wrist removed during transfer via tech from ED, patient and family oriented to unit and call bell within reach.   Betha Loa Viveka Wilmeth, RN
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
## 2016-10-12 NOTE — ED Triage Notes (Signed)
Pt was found on the floor at a nursing facility. Pt arrives as STEMI with stemi team at bedside. Pt denies any chest pain at this time, Pt is lethargic responds to voice and will follow commands. Speech is clear. Pt does not remember falling.

## 2016-10-12 NOTE — ED Notes (Signed)
Pt removed from bedpan.

## 2016-10-13 DIAGNOSIS — R55 Syncope and collapse: Secondary | ICD-10-CM | POA: Diagnosis not present

## 2016-10-13 LAB — CBC
HCT: 40.2 % (ref 36.0–46.0)
HEMOGLOBIN: 13.2 g/dL (ref 12.0–15.0)
MCH: 27 pg (ref 26.0–34.0)
MCHC: 32.8 g/dL (ref 30.0–36.0)
MCV: 82.4 fL (ref 78.0–100.0)
Platelets: 252 10*3/uL (ref 150–400)
RBC: 4.88 MIL/uL (ref 3.87–5.11)
RDW: 14.7 % (ref 11.5–15.5)
WBC: 10.1 10*3/uL (ref 4.0–10.5)

## 2016-10-13 LAB — BASIC METABOLIC PANEL
Anion gap: 6 (ref 5–15)
BUN: 13 mg/dL (ref 6–20)
CALCIUM: 8.7 mg/dL — AB (ref 8.9–10.3)
CHLORIDE: 109 mmol/L (ref 101–111)
CO2: 24 mmol/L (ref 22–32)
CREATININE: 0.88 mg/dL (ref 0.44–1.00)
GFR calc Af Amer: >60 mL/min (ref >60)
GFR calc non Af Amer: >60 mL/min (ref >60)
GLUCOSE: 115 mg/dL — AB (ref 65–99)
Potassium: 3.4 mmol/L — ABNORMAL LOW (ref 3.5–5.1)
Sodium: 139 mmol/L (ref 135–145)

## 2016-10-13 LAB — HIV ANTIBODY (ROUTINE TESTING W REFLEX): HIV SCREEN 4TH GENERATION: NONREACTIVE

## 2016-10-13 LAB — GLUCOSE, CAPILLARY: Glucose-Capillary: 137 mg/dL — ABNORMAL HIGH (ref 65–99)

## 2016-10-13 LAB — POCT I-STAT TROPONIN I: Troponin i, poc: 0.01 ng/mL (ref 0.00–0.08)

## 2016-10-13 NOTE — Progress Notes (Signed)
  Date: 10/13/2016  Patient name: Melissa Baird  Medical record number: [id number]  Date of birth: 09/03/1941   This patient's plan of care was discussed with the house staff. Please see their note for complete details. I concur with their findings. Pt is significantly more awake and interactive today.  Vitals:   10/12/16 1808 10/12/16 2142  BP:  (!) 117/49  Pulse:  62  Resp:    Temp: (!) 96.9 F (36.1 C)    CV: rrr Chest: cta, decreased at bases Abd: bs+, soft, non-tender.   Labs of note: WBC 10.1 Cx- none  A/P Mental status change suspect this was med related.  Will discuss with family and with SNF regarding her d/c plans.   Campbell Riches, MD 10/13/2016, 1:48 PM

## 2016-10-13 NOTE — NC FL2 (Signed)
[place] LEVEL OF CARE SCREENING TOOL     IDENTIFICATION  Patient Name: Melissa Baird Birthdate: [birth year] Sex: female Admission Date (Current Location): 10/12/2016  [facility] and [place] Number:  [employer] and Address:  [facility] [place], [place] [address], [place], [place] [address]      Provider Number: [id number]  Attending Physician Name and Address:  Campbell Riches, MD  Relative Name and Phone Number:  Mariann Laster, daughter, [phone]    Current Level of Care: Hospital Recommended Level of Care: [place] Prior Approval Number:    Date Approved/Denied:   PASRR Number:    Discharge Plan: Other (Comment) (ALF)    Current Diagnoses: Patient Active Problem List   Diagnosis Date Noted  . Loss of consciousness ([place]) 10/12/2016  . Chest pain 02/23/2016  . Dementia   . Unstable angina ([place]) 06/17/2013  . HLD (hyperlipidemia) 06/17/2013  . TOBACCO ABUSE 09/30/2010  . 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 [place] DISEASE, LUMBAR 09/14/2010  . Back pain, chronic 09/14/2010  . Depression 09/03/2010  . Essential hypertension 09/03/2010  . SYNCOPE 09/03/2010    Orientation RESPIRATION BLADDER Height & Weight     Self  Normal Continent Weight: 69.2 kg (152 lb 9.6 oz) Height:  5\' 5"  (165.1 cm)  BEHAVIORAL SYMPTOMS/MOOD NEUROLOGICAL BOWEL NUTRITION STATUS      Continent Diet (Regular)  AMBULATORY STATUS COMMUNICATION OF NEEDS Skin   Supervision Verbally Normal                       Personal Care Assistance Level of Assistance  Bathing, Feeding, Dressing Bathing Assistance: Limited assistance Feeding assistance: Limited assistance Dressing Assistance: Limited assistance     Functional Limitations Info  Hearing   Hearing Info: Impaired      SPECIAL CARE FACTORS FREQUENCY                       Contractures      Additional Factors Info  Code Status, Allergies Code Status  Info: Full Allergies Info: NKA           Current Medications (10/13/2016):    Discharge Medications: STOP taking these medications   amLODipine 10 MG tablet Commonly known as:  NORVASC  cephALEXin 500 MG capsule Commonly known as:  KEFLEX  metoprolol 50 MG tablet Commonly known as:  LOPRESSOR  oxyCODONE 5 MG immediate release tablet Commonly known as:  Oxy IR/ROXICODONE    TAKE these medications   acetaminophen 325 MG tablet Commonly known as:  TYLENOL Take 2 tablets (650 mg total) by mouth every 6 (six) hours as needed. What changed:  reasons to take this  aspirin 81 MG chewable tablet Chew 1 tablet (81 mg total) by mouth daily.  buPROPion 300 MG 24 hr tablet Commonly known as:  WELLBUTRIN XL Take 300 mg by mouth every morning.  calcium citrate 950 MG tablet Commonly known as:  CALCITRATE - dosed in mg elemental calcium Take 400 mg of elemental calcium by mouth 2 (two) times daily.  donepezil 10 MG tablet Commonly known as:  ARICEPT Take 10 mg by mouth daily.  FOSAMAX 70 MG tablet Generic drug:  alendronate Take 70 mg by mouth every 7 (seven) days. Take with a full glass of water on an empty stomach.  isosorbide mononitrate 30 MG 24 hr tablet Commonly known as:  IMDUR Take 90 mg by mouth daily.  lisinopril 20  MG tablet Commonly known as:  PRINIVIL,ZESTRIL Take 1.5 tablets (30 mg total) by mouth daily.  memantine 10 MG tablet Commonly known as:  NAMENDA Take 10 mg by mouth 2 (two) times daily.  nitroGLYCERIN 0.4 MG SL tablet Commonly known as:  NITROSTAT Place 1 tablet (0.4 mg total) under the tongue every 5 (five) minutes as needed for chest pain.  omeprazole 20 MG capsule Commonly known as:  PRILOSEC Take 20 mg by mouth daily.  ondansetron 4 MG tablet Commonly known as:  ZOFRAN Take 1 tablet (4 mg total) by mouth every 8 (eight) hours as needed for nausea or vomiting.  pravastatin 40 MG tablet Commonly known as:  PRAVACHOL Take 40 mg by mouth every  evening.  Vitamin D3 5000 units Caps Take 5,000 Units by mouth daily.     Relevant Imaging Results:  Relevant Lab Results:   Additional Information SSN: [place] [address] [place], [place]

## 2016-10-13 NOTE — Progress Notes (Signed)
Patient will DC to: [facility] [phone]) Anticipated DC date: 10/13/16 Family notified: Daughter Transport by: Car   Per MD patient ready for DC to ALF. RN, patient, patient's family, and facility notified of DC. Discharge Summary sent to facility. RN given number for report.   CSW signing off.  Cedric Fishman, [place] Social Worker [phone]

## 2016-10-13 NOTE — Clinical Social Work Note (Signed)
Clinical Social Work Assessment  Patient Details  Name: Melissa Baird MRN: [id number] Date of Birth: 1941-08-17  Date of referral:  10/13/16               Reason for consult:  Discharge Planning                Permission sought to share information with:  Facility Sport and exercise psychologist, Family Supports Permission granted to share information::  No (Patient is disoriented)  Name::     Armed forces logistics/support/administrative officer::  [facility]  Relationship::  Daughter  Contact Information:  [phone]  Housing/Transportation Living arrangements for the past 2 months:  [place] of Information:  Adult Children Patient Interpreter Needed:  None Criminal Activity/Legal Involvement Pertinent to Current Situation/Hospitalization:  No - Comment as needed Significant Relationships:  Adult Children Lives with:  Facility Resident Do you feel safe going back to the place where you live?  Yes Need for family participation in patient care:  Yes (Comment)  Care giving concerns:  CSW received consult for discharge planning. Patient is disoriented. CSW spoke with patient's daughter. She stated patient has been a long term care resident at [facility] in [place] and will return there at discharge. CSW to continue to follow and assist with discharge planning needs.   Social Worker assessment / plan:  Family will take patient back to [facility] by car at discharge.   Employment status:  Retired Forensic scientist:  Medicare PT Recommendations:  Not assessed at this time Information / Referral to community resources:     Patient/Family's Response to care:  Patient daughter believes patient will be more comfortable if family transports her by car back to ALF.  Patient/Family's Understanding of and Emotional Response to Diagnosis, Current Treatment, and Prognosis:  No questions or concerns at this time.  Emotional Assessment Appearance:  Appears stated age Attitude/Demeanor/Rapport:   Unable to Assess Affect (typically observed):  Unable to Assess Orientation:  Oriented to Self Alcohol / Substance use:  Not Applicable Psych involvement (Current and /or in the community):  No (Comment)  Discharge Needs  Concerns to be addressed:  Care Coordination Readmission within the last 30 days:  No Current discharge risk:  None Barriers to Discharge:  No Barriers Identified   Benard Halsted, [place] 10/13/2016, 12:23 PM

## 2016-10-13 NOTE — Discharge Instructions (Signed)
Melissa Baird was hospitalized due to becoming unresponsive and having low body temperature, low blood pressure, and low heart rate.  We think this was most likely due to sedation from her medicine for high blood pressure and pain.  Please stop taking amlodipine, metoprolol, and oxycodone for now.  The physician at the facility can continue to work on blood pressure and pain control.

## 2016-10-13 NOTE — Progress Notes (Signed)
   Subjective:  No acute events overnight.  She is alert, calm, and cooperative with no complaints.  Objective:  Recent Vital Signs T 97.4 BP 154/52 HR 74 RR 18 SpO2 96%  Physical Exam  Constitutional:  Calm, alert, cooperative, in no distress.  Answers questions with single words, sometimes inappropriately.  Cardiovascular: Normal rate and regular rhythm.   Pulmonary/Chest: Effort normal and breath sounds normal.  Neurological: She is alert.  Alert, oriented to self only, follows simple commands Moving all extremities spontaneously Gaze vergent, EOM appear intact with spontaneous eye movements  Psychiatric:  Dementia Mental status at baseline per daughter   CBC Latest Ref Rng & Units 10/13/2016 10/12/2016 10/12/2016  WBC 4.0 - 10.5 K/uL 10.1 6.6 8.9  Hemoglobin 12.0 - 15.0 g/dL 13.2 13.6 13.8  Hematocrit 36.0 - 46.0 % 40.2 41.4 41.9  Platelets 150 - 400 K/uL 252 263 228   BMP Latest Ref Rng & Units 10/13/2016 10/12/2016 10/12/2016  Glucose 65 - 99 mg/dL 115(H) - 143(H)  BUN 6 - 20 mg/dL 13 - 15  Creatinine 0.44 - 1.00 mg/dL 0.88 1.08(H) 1.12(H)  Sodium 135 - 145 mmol/L 139 - 136  Potassium 3.5 - 5.1 mmol/L 3.4(L) - 3.9  Chloride 101 - 111 mmol/L 109 - 105  CO2 22 - 32 mmol/L 24 - 22  Calcium 8.9 - 10.3 mg/dL 8.7(L) - 9.3     Assessment/Plan:  Active Problems:   Loss of consciousness (HCC)  #Loss of Consciousness Likely contributions polypharmacy, with response to Narcan in ED and rapid return to baseline with holding meds including antihypertensives and narcotics.  Troponins undetectable, ACS ruled out.  Her nonfocal neurologic exam and full recovery makes stroke unlikely, though a previously unknown chronic stroke was detected on head CT.  She has been hemodynamically stable, regular heart rate, and mildly hypertensive since admission.  She is stable, at baseline, and ready for discharge. -Trend troponins -Hold oxycodone, amlodipine, metoprolol  #HTN -Continue  lisinopril 30 mg -Monitor BP and resume amlodipine, metoprolol, imdur as needed  #Chronic Back Pain -Tylenol PRN -Hold opiates  Dispo: Anticipated discharge today.  Minus Liberty, MD 10/13/2016, 2:56 PM Pager: [phone]

## 2016-10-13 NOTE — Discharge Summary (Signed)
Name: Melissa Baird MRN: [id number] DOB: 26-Jan-1941 75 y.o. PCP: Lucia Gaskins, MD  Date of Admission: 10/12/2016 11:48 AM Date of Discharge: 10/13/2016 Attending Physician: Campbell Riches, MD  Discharge Diagnosis:  Active Problems:   Loss of consciousness [facility])   Discharge Medications:   Medication List    STOP taking these medications   amLODipine 10 MG tablet Commonly known as:  NORVASC   cephALEXin 500 MG capsule Commonly known as:  KEFLEX   metoprolol 50 MG tablet Commonly known as:  LOPRESSOR   oxyCODONE 5 MG immediate release tablet Commonly known as:  Oxy IR/ROXICODONE     TAKE these medications   acetaminophen 325 MG tablet Commonly known as:  TYLENOL Take 2 tablets (650 mg total) by mouth every 6 (six) hours as needed. What changed:  reasons to take this   aspirin 81 MG chewable tablet Chew 1 tablet (81 mg total) by mouth daily.   buPROPion 300 MG 24 hr tablet Commonly known as:  WELLBUTRIN XL Take 300 mg by mouth every morning.   calcium citrate 950 MG tablet Commonly known as:  CALCITRATE - dosed in mg elemental calcium Take 400 mg of elemental calcium by mouth 2 (two) times daily.   donepezil 10 MG tablet Commonly known as:  ARICEPT Take 10 mg by mouth daily.   FOSAMAX 70 MG tablet Generic drug:  alendronate Take 70 mg by mouth every 7 (seven) days. Take with a full glass of water on an empty stomach.   isosorbide mononitrate 30 MG 24 hr tablet Commonly known as:  IMDUR Take 90 mg by mouth daily.   lisinopril 20 MG tablet Commonly known as:  PRINIVIL,ZESTRIL Take 1.5 tablets (30 mg total) by mouth daily.   memantine 10 MG tablet Commonly known as:  NAMENDA Take 10 mg by mouth 2 (two) times daily.   nitroGLYCERIN 0.4 MG SL tablet Commonly known as:  NITROSTAT Place 1 tablet (0.4 mg total) under the tongue every 5 (five) minutes as needed for chest pain.   omeprazole 20 MG capsule Commonly known as:  PRILOSEC Take 20  mg by mouth daily.   ondansetron 4 MG tablet Commonly known as:  ZOFRAN Take 1 tablet (4 mg total) by mouth every 8 (eight) hours as needed for nausea or vomiting.   pravastatin 40 MG tablet Commonly known as:  PRAVACHOL Take 40 mg by mouth every evening.   Vitamin D3 5000 units Caps Take 5,000 Units by mouth daily.       Disposition and follow-up:   MelissaMelita FAMIE Baird was discharged from [facility] in Stable condition.  At the hospital follow up visit please address:  1.  Hypertension.  Since she was found hypotensive and bradycardic, we have discontinued her beta blocker and calcium channel blocker.  Please continue working to optimize her antihypertensive therapies with goal of avoiding hypotension.  2.  Chronic back pain.  Her opiate prescription did not seem excessive, but with response to Narcan seen in the ED there is some concern for over-sedation.  Pleasure ensure she is not overly sedated on narcotics.  3.  Loss of consciousness.  Likely due to polypharmacy.  Please ensure no further falls focal neurologic deficits to suggest stroke, and no signs of infection.  These etiologies were deemed unlikely precipitants of this admission, but remain considerations.  She did have evidence of remote stroke on CT.  4.  Labs / imaging needed at time of follow-up: none  5.  Pending labs/ test needing follow-up: none  Follow-up Appointments: none  Hospital Course by problem list: Active Problems:   Loss of consciousness ([place])   1. Loss of consciousness Melissa Sensing was found hypothermic, hypotensive, bradycardic, and lethargic at her facility.  In the ED, she was actively warmed and received 2 dose of Narcan which improved her mental status and hemodynamics.  She became alert and cooperative, and did not have any evident focal neurologic deficits, though exam was limited by her baseline dementia.  She was admitted for observation, and most of her medications held.   After admission, she had stable vital signs, with blood pressures increasing to mild hypertensive ranges.  Per her daughter, her mental status was back to baseline by time of discharge.  2. Hypertension Amlodipine, metoprolol, Imdur held on admission for hypertension.    3. Goals of Care Daughter stated that she had not considered advanced care planning or goals of care for her mother.  Began discussion of the option of palliative approach to her medical care and DNR status.  Discharge Vitals:   BP 147/51 HR 66 RR 18 SpO2 (97%) Temp 98.1  Pertinent Labs, Studies, and Procedures:   CBC Latest Ref Rng & Units 10/13/2016 10/12/2016 10/12/2016  WBC 4.0 - 10.5 K/uL 10.1 6.6 8.9  Hemoglobin 12.0 - 15.0 g/dL 13.2 13.6 13.8  Hematocrit 36.0 - 46.0 % 40.2 41.4 41.9  Platelets 150 - 400 K/uL 252 263 228   CMP Latest Ref Rng & Units 10/13/2016 10/12/2016 10/12/2016  Glucose 65 - 99 mg/dL 115(H) - 143(H)  BUN 6 - 20 mg/dL 13 - 15  Creatinine 0.44 - 1.00 mg/dL 0.88 1.08(H) 1.12(H)  Sodium 135 - 145 mmol/L 139 - 136  Potassium 3.5 - 5.1 mmol/L 3.4(L) - 3.9  Chloride 101 - 111 mmol/L 109 - 105  CO2 22 - 32 mmol/L 24 - 22  Calcium 8.9 - 10.3 mg/dL 8.7(L) - 9.3  Total Protein 6.5 - 8.1 g/dL - - 5.9(L)  Total Bilirubin 0.3 - 1.2 mg/dL - - 0.6  Alkaline Phos 38 - 126 U/L - - 65  AST 15 - 41 U/L - - 23  ALT 14 - 54 U/L - - 16   CT Head without Contrast IMPRESSION: Atrophy with small vessel chronic ischemic changes of deep cerebral white matter.  Small age-indeterminate lacunar infarct RIGHT caudate, new since 2011.  Discharge Instructions:  Melissa Baird was hospitalized due to becoming unresponsive and having low body temperature, low blood pressure, and low heart rate.  We think this was most likely due to sedation from her medicine for high blood pressure and pain.  Please stop taking amlodipine, metoprolol, and oxycodone for now.  The physician at the facility can continue to work on blood  pressure and pain control.  Signed: Minus Liberty, MD 10/13/2016, 2:40 PM   Pager: [phone]

## 2016-10-13 NOTE — Progress Notes (Signed)
Patient discharged in the care of daughter who is taking her to [facility]. Report called to Butch Penny at facility. IV removed and belongings accounted for.   Betha Loa Adonnis Salceda, RN

## 2016-10-13 NOTE — Progress Notes (Signed)
   10/13/16 1330  Clinical Encounter Type  Visited With Patient and family together  Visit Type Other (Comment) ([place] consult)  Referral From Nurse  Consult/Referral To Chaplain  Spiritual Encounters  Spiritual Needs Other (Comment) (Advanced Directive)  Stress Factors  Patient Stress Factors Health changes  Family Stress Factors Family relationships  Went over advanced directive with Pt and daughter. Notary and witnesses observed signature. Original and copy to Pt and one to chart.

## 2016-10-18 ENCOUNTER — Ambulatory Visit (HOSPITAL_COMMUNITY)
Admission: RE | Admit: 2016-10-18 | Discharge: 2016-10-18 | Disposition: A | Discharge status: Home / Self Care | Payer: Medicare Other | Source: Ambulatory Visit | Attending: Adult Health | Admitting: Adult Health

## 2016-10-18 DIAGNOSIS — R0600 Dyspnea, unspecified: Secondary | ICD-10-CM | POA: Diagnosis present

## 2016-10-18 LAB — ECHOCARDIOGRAM COMPLETE
AVLVOTPG: 6 mmHg
CHL CUP STROKE VOLUME: 32 mL
E decel time: 405 msec
EERAT: 10.01
FS: 42 % (ref 28–44)
IVS/LV PW RATIO, ED: 1.03
LA ID, A-P, ES: 28 mm
LA diam end sys: 28 mm
LA diam index: 1.56 cm/m2
LA vol: 34.5 mL
LAVOLA4C: 32.1 mL
LAVOLIN: 19.2 mL/m2
LV SIMPSON'S DISK: 68
LV TDI E'LATERAL: 7.51
LV TDI E'MEDIAL: 6.31
LV dias vol index: 26 mL/m2
LV dias vol: 47 mL (ref 46–106)
LV e' LATERAL: 7.51 cm/s
LV sys vol index: 9 mL/m2
LVEEAVG: 10.01
LVEEMED: 10.01
LVOT SV: 81 mL
LVOT VTI: 28.5 cm
LVOT area: 2.84 cm2
LVOT diameter: 19 mm
LVOT peak vel: 119 cm/s
LVSYSVOL: 15 mL (ref 14–42)
MV Dec: 405
MV Peak grad: 2 mmHg
MVPKAVEL: 133 m/s
MVPKEVEL: 75.2 m/s
PW: 9.7 mm — AB (ref 0.6–1.1)
RV LATERAL S' VELOCITY: 12.4 cm/s
TAPSE: 18.8 mm

## 2016-10-18 NOTE — Progress Notes (Signed)
*  PRELIMINARY RESULTS* Echocardiogram 2D Echocardiogram has been performed.  Melissa Baird 10/18/2016, 10:06 AM

## 2016-12-13 ENCOUNTER — Emergency Department (HOSPITAL_COMMUNITY): Payer: Medicare Other

## 2016-12-13 ENCOUNTER — Emergency Department (HOSPITAL_COMMUNITY)
Admission: EM | Admit: 2016-12-13 | Discharge: 2016-12-13 | Disposition: A | Discharge status: Home / Self Care | Payer: Medicare Other | Attending: Emergency Medicine | Admitting: Emergency Medicine

## 2016-12-13 ENCOUNTER — Encounter (HOSPITAL_COMMUNITY): Payer: Self-pay | Admitting: Emergency Medicine

## 2016-12-13 DIAGNOSIS — I10 Essential (primary) hypertension: Secondary | ICD-10-CM | POA: Diagnosis not present

## 2016-12-13 DIAGNOSIS — J9801 Acute bronchospasm: Secondary | ICD-10-CM | POA: Diagnosis not present

## 2016-12-13 DIAGNOSIS — J069 Acute upper respiratory infection, unspecified: Secondary | ICD-10-CM | POA: Insufficient documentation

## 2016-12-13 DIAGNOSIS — J40 Bronchitis, not specified as acute or chronic: Secondary | ICD-10-CM | POA: Insufficient documentation

## 2016-12-13 DIAGNOSIS — G309 Alzheimer's disease, unspecified: Secondary | ICD-10-CM | POA: Insufficient documentation

## 2016-12-13 DIAGNOSIS — Z79899 Other long term (current) drug therapy: Secondary | ICD-10-CM | POA: Diagnosis not present

## 2016-12-13 DIAGNOSIS — F17210 Nicotine dependence, cigarettes, uncomplicated: Secondary | ICD-10-CM | POA: Insufficient documentation

## 2016-12-13 DIAGNOSIS — J449 Chronic obstructive pulmonary disease, unspecified: Secondary | ICD-10-CM | POA: Insufficient documentation

## 2016-12-13 DIAGNOSIS — R05 Cough: Secondary | ICD-10-CM | POA: Diagnosis present

## 2016-12-13 DIAGNOSIS — Z7982 Long term (current) use of aspirin: Secondary | ICD-10-CM | POA: Diagnosis not present

## 2016-12-13 DIAGNOSIS — I2510 Atherosclerotic heart disease of native coronary artery without angina pectoris: Secondary | ICD-10-CM | POA: Diagnosis not present

## 2016-12-13 LAB — URINALYSIS, ROUTINE W REFLEX MICROSCOPIC
Bilirubin Urine: NEGATIVE
GLUCOSE, UA: NEGATIVE mg/dL
Hgb urine dipstick: NEGATIVE
KETONES UR: NEGATIVE mg/dL
LEUKOCYTES UA: NEGATIVE
NITRITE: NEGATIVE
PH: 6 (ref 5.0–8.0)
Protein, ur: NEGATIVE mg/dL
SPECIFIC GRAVITY, URINE: 1.012 (ref 1.005–1.030)

## 2016-12-13 LAB — COMPREHENSIVE METABOLIC PANEL
ALBUMIN: 3.6 g/dL (ref 3.5–5.0)
ALK PHOS: 86 U/L (ref 38–126)
ALT: 17 U/L (ref 14–54)
ANION GAP: 7 (ref 5–15)
AST: 18 U/L (ref 15–41)
BILIRUBIN TOTAL: 0.5 mg/dL (ref 0.3–1.2)
BUN: 18 mg/dL (ref 6–20)
CALCIUM: 9.1 mg/dL (ref 8.9–10.3)
CO2: 27 mmol/L (ref 22–32)
Chloride: 101 mmol/L (ref 101–111)
Creatinine, Ser: 1.19 mg/dL — ABNORMAL HIGH (ref 0.44–1.00)
GFR, EST AFRICAN AMERICAN: 51 mL/min — AB (ref >60)
GFR, EST NON AFRICAN AMERICAN: 44 mL/min — AB (ref >60)
GLUCOSE: 111 mg/dL — AB (ref 65–99)
POTASSIUM: 3.8 mmol/L (ref 3.5–5.1)
Sodium: 135 mmol/L (ref 135–145)
TOTAL PROTEIN: 7 g/dL (ref 6.5–8.1)

## 2016-12-13 LAB — CBC WITH DIFFERENTIAL/PLATELET
BASOS PCT: 0 %
Basophils Absolute: 0 10*3/uL (ref 0.0–0.1)
Eosinophils Absolute: 0.2 10*3/uL (ref 0.0–0.7)
Eosinophils Relative: 2 %
HEMATOCRIT: 44.2 % (ref 36.0–46.0)
HEMOGLOBIN: 14.2 g/dL (ref 12.0–15.0)
LYMPHS ABS: 2.8 10*3/uL (ref 0.7–4.0)
LYMPHS PCT: 28 %
MCH: 27.8 pg (ref 26.0–34.0)
MCHC: 32.1 g/dL (ref 30.0–36.0)
MCV: 86.5 fL (ref 78.0–100.0)
MONO ABS: 0.7 10*3/uL (ref 0.1–1.0)
MONOS PCT: 7 %
NEUTROS ABS: 6.3 10*3/uL (ref 1.7–7.7)
NEUTROS PCT: 63 %
Platelets: 269 10*3/uL (ref 150–400)
RBC: 5.11 MIL/uL (ref 3.87–5.11)
RDW: 14.4 % (ref 11.5–15.5)
WBC: 9.9 10*3/uL (ref 4.0–10.5)

## 2016-12-13 MED ORDER — IPRATROPIUM-ALBUTEROL 0.5-2.5 (3) MG/3ML IN SOLN
3.0000 mL | Freq: Once | RESPIRATORY_TRACT | Status: AC
  Administered 2016-12-13: 3 mL via RESPIRATORY_TRACT
  Filled 2016-12-13: qty 3

## 2016-12-13 MED ORDER — ALBUTEROL SULFATE (2.5 MG/3ML) 0.083% IN NEBU
2.5000 mg | INHALATION_SOLUTION | Freq: Once | RESPIRATORY_TRACT | Status: AC
  Administered 2016-12-13: 2.5 mg via RESPIRATORY_TRACT
  Filled 2016-12-13: qty 3

## 2016-12-13 MED ORDER — ALBUTEROL SULFATE HFA 108 (90 BASE) MCG/ACT IN AERS
2.0000 | INHALATION_SPRAY | RESPIRATORY_TRACT | Status: DC | PRN
  Filled 2016-12-13: qty 6.7

## 2016-12-13 MED ORDER — AZITHROMYCIN 250 MG PO TABS: ORAL_TABLET | ORAL | 0 refills | Status: DC

## 2016-12-13 NOTE — ED Triage Notes (Signed)
Patient presents from [facility] with caregiver. [facility] wants patient evaluated and treated for UTI.
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
## 2016-12-13 NOTE — ED Notes (Signed)
Rt called for inhaler and spacer.

## 2016-12-13 NOTE — ED Notes (Signed)
Pt taken to ct 

## 2016-12-13 NOTE — Discharge Instructions (Signed)
Use the inhaler every 4-6 hours as needed for wheezing. Follow-up with your doctor in 2-3 days for recheck

## 2016-12-30 ENCOUNTER — Emergency Department (HOSPITAL_COMMUNITY): Payer: Medicare Other

## 2016-12-30 ENCOUNTER — Encounter (HOSPITAL_COMMUNITY): Payer: Self-pay | Admitting: Emergency Medicine

## 2016-12-30 ENCOUNTER — Inpatient Hospital Stay (HOSPITAL_COMMUNITY)
Admission: EM | Admit: 2016-12-30 | Discharge: 2017-01-03 | DRG: 195 | Disposition: A | Discharge status: Nursing Home (Includes ICF) | Payer: Medicare Other | Attending: Family Medicine | Admitting: Family Medicine

## 2016-12-30 DIAGNOSIS — I10 Essential (primary) hypertension: Secondary | ICD-10-CM | POA: Diagnosis present

## 2016-12-30 DIAGNOSIS — R0902 Hypoxemia: Secondary | ICD-10-CM

## 2016-12-30 DIAGNOSIS — M1990 Unspecified osteoarthritis, unspecified site: Secondary | ICD-10-CM | POA: Diagnosis present

## 2016-12-30 DIAGNOSIS — G8929 Other chronic pain: Secondary | ICD-10-CM | POA: Diagnosis present

## 2016-12-30 DIAGNOSIS — J111 Influenza due to unidentified influenza virus with other respiratory manifestations: Secondary | ICD-10-CM | POA: Diagnosis not present

## 2016-12-30 DIAGNOSIS — J449 Chronic obstructive pulmonary disease, unspecified: Secondary | ICD-10-CM | POA: Diagnosis present

## 2016-12-30 DIAGNOSIS — R05 Cough: Secondary | ICD-10-CM | POA: Diagnosis not present

## 2016-12-30 DIAGNOSIS — Z7982 Long term (current) use of aspirin: Secondary | ICD-10-CM

## 2016-12-30 DIAGNOSIS — G309 Alzheimer's disease, unspecified: Secondary | ICD-10-CM | POA: Diagnosis present

## 2016-12-30 DIAGNOSIS — E785 Hyperlipidemia, unspecified: Secondary | ICD-10-CM | POA: Diagnosis present

## 2016-12-30 DIAGNOSIS — Z8249 Family history of ischemic heart disease and other diseases of the circulatory system: Secondary | ICD-10-CM | POA: Diagnosis not present

## 2016-12-30 DIAGNOSIS — F0151 Vascular dementia with behavioral disturbance: Secondary | ICD-10-CM

## 2016-12-30 DIAGNOSIS — I2510 Atherosclerotic heart disease of native coronary artery without angina pectoris: Secondary | ICD-10-CM | POA: Diagnosis present

## 2016-12-30 DIAGNOSIS — J101 Influenza due to other identified influenza virus with other respiratory manifestations: Secondary | ICD-10-CM | POA: Diagnosis present

## 2016-12-30 DIAGNOSIS — Z79899 Other long term (current) drug therapy: Secondary | ICD-10-CM | POA: Diagnosis not present

## 2016-12-30 DIAGNOSIS — Z8673 Personal history of transient ischemic attack (TIA), and cerebral infarction without residual deficits: Secondary | ICD-10-CM | POA: Diagnosis not present

## 2016-12-30 DIAGNOSIS — R059 Cough, unspecified: Secondary | ICD-10-CM

## 2016-12-30 DIAGNOSIS — F329 Major depressive disorder, single episode, unspecified: Secondary | ICD-10-CM | POA: Diagnosis present

## 2016-12-30 DIAGNOSIS — K219 Gastro-esophageal reflux disease without esophagitis: Secondary | ICD-10-CM | POA: Diagnosis present

## 2016-12-30 DIAGNOSIS — Z87891 Personal history of nicotine dependence: Secondary | ICD-10-CM | POA: Diagnosis not present

## 2016-12-30 DIAGNOSIS — Z66 Do not resuscitate: Secondary | ICD-10-CM | POA: Diagnosis present

## 2016-12-30 DIAGNOSIS — Z87440 Personal history of urinary (tract) infections: Secondary | ICD-10-CM

## 2016-12-30 DIAGNOSIS — Z7983 Long term (current) use of bisphosphonates: Secondary | ICD-10-CM

## 2016-12-30 DIAGNOSIS — F17200 Nicotine dependence, unspecified, uncomplicated: Secondary | ICD-10-CM

## 2016-12-30 DIAGNOSIS — M545 Low back pain: Secondary | ICD-10-CM | POA: Diagnosis present

## 2016-12-30 DIAGNOSIS — M5137 Other intervertebral disc degeneration, lumbosacral region: Secondary | ICD-10-CM

## 2016-12-30 DIAGNOSIS — F01518 Vascular dementia, unspecified severity, with other behavioral disturbance: Secondary | ICD-10-CM

## 2016-12-30 DIAGNOSIS — F0390 Unspecified dementia without behavioral disturbance: Secondary | ICD-10-CM | POA: Diagnosis present

## 2016-12-30 DIAGNOSIS — F0280 Dementia in other diseases classified elsewhere without behavioral disturbance: Secondary | ICD-10-CM | POA: Diagnosis present

## 2016-12-30 DIAGNOSIS — E782 Mixed hyperlipidemia: Secondary | ICD-10-CM

## 2016-12-30 LAB — CBC WITH DIFFERENTIAL/PLATELET
Basophils Absolute: 0 10*3/uL (ref 0.0–0.1)
Basophils Relative: 1 %
EOS PCT: 0 %
Eosinophils Absolute: 0 10*3/uL (ref 0.0–0.7)
HCT: 44.2 % (ref 36.0–46.0)
HEMOGLOBIN: 14.4 g/dL (ref 12.0–15.0)
LYMPHS ABS: 0.9 10*3/uL (ref 0.7–4.0)
LYMPHS PCT: 13 %
MCH: 27.4 pg (ref 26.0–34.0)
MCHC: 32.6 g/dL (ref 30.0–36.0)
MCV: 84.2 fL (ref 78.0–100.0)
Monocytes Absolute: 0.5 10*3/uL (ref 0.1–1.0)
Monocytes Relative: 8 %
Neutro Abs: 5.1 10*3/uL (ref 1.7–7.7)
Neutrophils Relative %: 78 %
PLATELETS: 258 10*3/uL (ref 150–400)
RBC: 5.25 MIL/uL — AB (ref 3.87–5.11)
RDW: 14.3 % (ref 11.5–15.5)
WBC: 6.5 10*3/uL (ref 4.0–10.5)

## 2016-12-30 LAB — BASIC METABOLIC PANEL
Anion gap: 9 (ref 5–15)
BUN: 22 mg/dL — AB (ref 6–20)
CHLORIDE: 101 mmol/L (ref 101–111)
CO2: 27 mmol/L (ref 22–32)
Calcium: 9.4 mg/dL (ref 8.9–10.3)
Creatinine, Ser: 0.96 mg/dL (ref 0.44–1.00)
GFR calc Af Amer: >60 mL/min (ref >60)
GFR calc non Af Amer: 57 mL/min — ABNORMAL LOW (ref >60)
Glucose, Bld: 96 mg/dL (ref 65–99)
POTASSIUM: 4.1 mmol/L (ref 3.5–5.1)
SODIUM: 137 mmol/L (ref 135–145)

## 2016-12-30 LAB — URINALYSIS, ROUTINE W REFLEX MICROSCOPIC
Bilirubin Urine: NEGATIVE
GLUCOSE, UA: NEGATIVE mg/dL
HGB URINE DIPSTICK: NEGATIVE
Leukocytes, UA: NEGATIVE
Nitrite: NEGATIVE
PH: 6 (ref 5.0–8.0)
Protein, ur: NEGATIVE mg/dL

## 2016-12-30 LAB — INFLUENZA PANEL BY PCR (TYPE A & B)
Influenza A By PCR: POSITIVE — AB
Influenza B By PCR: NEGATIVE

## 2016-12-30 LAB — LACTIC ACID, PLASMA
LACTIC ACID, VENOUS: 1.2 mmol/L (ref 0.5–1.9)
LACTIC ACID, VENOUS: 1.2 mmol/L (ref 0.5–1.9)

## 2016-12-30 LAB — AMMONIA: Ammonia: 25 umol/L (ref 9–35)

## 2016-12-30 MED ORDER — IPRATROPIUM-ALBUTEROL 0.5-2.5 (3) MG/3ML IN SOLN
3.0000 mL | Freq: Once | RESPIRATORY_TRACT | Status: AC
  Administered 2016-12-30: 3 mL via RESPIRATORY_TRACT
  Filled 2016-12-30: qty 3

## 2016-12-30 MED ORDER — ONDANSETRON HCL 4 MG PO TABS: 4.0000 mg | ORAL_TABLET | Freq: Four times a day (QID) | ORAL | Status: DC | PRN

## 2016-12-30 MED ORDER — DONEPEZIL HCL 5 MG PO TABS
10.0000 mg | ORAL_TABLET | Freq: Every day | ORAL | Status: DC
  Administered 2016-12-31 – 2017-01-03 (×4): 10 mg via ORAL
  Filled 2016-12-30 (×4): qty 2

## 2016-12-30 MED ORDER — MEMANTINE HCL 10 MG PO TABS
10.0000 mg | ORAL_TABLET | Freq: Two times a day (BID) | ORAL | Status: DC
  Administered 2016-12-31 – 2017-01-03 (×8): 10 mg via ORAL
  Filled 2016-12-30 (×8): qty 1

## 2016-12-30 MED ORDER — PRAVASTATIN SODIUM 40 MG PO TABS
40.0000 mg | ORAL_TABLET | Freq: Every evening | ORAL | Status: DC
  Administered 2016-12-31 – 2017-01-02 (×4): 40 mg via ORAL
  Filled 2016-12-30 (×4): qty 1

## 2016-12-30 MED ORDER — SODIUM CHLORIDE 0.9 % IV BOLUS (SEPSIS)
250.0000 mL | Freq: Once | INTRAVENOUS | Status: AC
  Administered 2016-12-30: 250 mL via INTRAVENOUS

## 2016-12-30 MED ORDER — ACETAMINOPHEN 325 MG PO TABS: 650.0000 mg | ORAL_TABLET | Freq: Four times a day (QID) | ORAL | Status: DC | PRN

## 2016-12-30 MED ORDER — PIPERACILLIN-TAZOBACTAM 3.375 G IVPB: 3.3750 g | Freq: Three times a day (TID) | INTRAVENOUS | Status: DC

## 2016-12-30 MED ORDER — ONDANSETRON HCL 4 MG/2ML IJ SOLN: 4.0000 mg | Freq: Four times a day (QID) | INTRAMUSCULAR | Status: DC | PRN

## 2016-12-30 MED ORDER — LISINOPRIL 10 MG PO TABS
30.0000 mg | ORAL_TABLET | Freq: Every day | ORAL | Status: DC
  Administered 2016-12-31 – 2017-01-03 (×4): 30 mg via ORAL
  Filled 2016-12-30 (×4): qty 3

## 2016-12-30 MED ORDER — BUPROPION HCL ER (XL) 300 MG PO TB24
300.0000 mg | ORAL_TABLET | Freq: Every day | ORAL | Status: DC
  Administered 2016-12-31 – 2017-01-03 (×4): 300 mg via ORAL
  Filled 2016-12-30 (×5): qty 1

## 2016-12-30 MED ORDER — IPRATROPIUM-ALBUTEROL 0.5-2.5 (3) MG/3ML IN SOLN
3.0000 mL | Freq: Four times a day (QID) | RESPIRATORY_TRACT | Status: DC
  Administered 2016-12-31 – 2017-01-01 (×6): 3 mL via RESPIRATORY_TRACT
  Filled 2016-12-30 (×7): qty 3

## 2016-12-30 MED ORDER — SODIUM CHLORIDE 0.9 % IV BOLUS (SEPSIS)
1000.0000 mL | Freq: Once | INTRAVENOUS | Status: AC
  Administered 2016-12-30: 1000 mL via INTRAVENOUS

## 2016-12-30 MED ORDER — ASPIRIN 81 MG PO CHEW
81.0000 mg | CHEWABLE_TABLET | Freq: Every day | ORAL | Status: DC
  Administered 2016-12-31 – 2017-01-03 (×4): 81 mg via ORAL
  Filled 2016-12-30 (×4): qty 1

## 2016-12-30 MED ORDER — VANCOMYCIN HCL IN DEXTROSE 750-5 MG/150ML-% IV SOLN
750.0000 mg | Freq: Two times a day (BID) | INTRAVENOUS | Status: DC
  Filled 2016-12-30 (×5): qty 150

## 2016-12-30 MED ORDER — METHYLPREDNISOLONE SODIUM SUCC 125 MG IJ SOLR
125.0000 mg | Freq: Once | INTRAMUSCULAR | Status: AC
  Administered 2016-12-30: 125 mg via INTRAVENOUS
  Filled 2016-12-30: qty 2

## 2016-12-30 MED ORDER — ISOSORBIDE MONONITRATE ER 60 MG PO TB24
90.0000 mg | ORAL_TABLET | Freq: Every day | ORAL | Status: DC
Start: 2016-12-31 — End: 2017-01-03
  Administered 2016-12-31 – 2017-01-03 (×4): 90 mg via ORAL
  Filled 2016-12-30 (×4): qty 2

## 2016-12-30 MED ORDER — PANTOPRAZOLE SODIUM 40 MG PO TBEC
40.0000 mg | DELAYED_RELEASE_TABLET | Freq: Every day | ORAL | Status: DC
  Administered 2016-12-31 – 2017-01-03 (×4): 40 mg via ORAL
  Filled 2016-12-30 (×4): qty 1

## 2016-12-30 MED ORDER — VANCOMYCIN HCL IN DEXTROSE 1-5 GM/200ML-% IV SOLN
1000.0000 mg | Freq: Once | INTRAVENOUS | Status: AC
  Administered 2016-12-30: 1000 mg via INTRAVENOUS
  Filled 2016-12-30: qty 200

## 2016-12-30 MED ORDER — OSELTAMIVIR PHOSPHATE 75 MG PO CAPS
75.0000 mg | ORAL_CAPSULE | Freq: Two times a day (BID) | ORAL | Status: DC
  Administered 2016-12-31: 75 mg via ORAL
  Filled 2016-12-30: qty 1

## 2016-12-30 MED ORDER — ACETAMINOPHEN 650 MG RE SUPP: 650.0000 mg | Freq: Four times a day (QID) | RECTAL | Status: DC | PRN

## 2016-12-30 MED ORDER — GUAIFENESIN ER 600 MG PO TB12
600.0000 mg | ORAL_TABLET | Freq: Two times a day (BID) | ORAL | Status: DC
  Administered 2016-12-31 – 2017-01-03 (×8): 600 mg via ORAL
  Filled 2016-12-30 (×8): qty 1

## 2016-12-30 MED ORDER — PIPERACILLIN-TAZOBACTAM 3.375 G IVPB 30 MIN
3.3750 g | Freq: Once | INTRAVENOUS | Status: AC
  Administered 2016-12-30: 3.375 g via INTRAVENOUS
  Filled 2016-12-30: qty 50

## 2016-12-30 MED ORDER — ENOXAPARIN SODIUM 40 MG/0.4ML ~~LOC~~ SOLN
40.0000 mg | SUBCUTANEOUS | Status: DC
  Administered 2016-12-31 – 2017-01-03 (×4): 40 mg via SUBCUTANEOUS
  Filled 2016-12-30 (×4): qty 0.4

## 2016-12-30 MED ORDER — METHYLPREDNISOLONE SODIUM SUCC 125 MG IJ SOLR
60.0000 mg | Freq: Two times a day (BID) | INTRAMUSCULAR | Status: DC
  Administered 2016-12-31 – 2017-01-03 (×7): 60 mg via INTRAVENOUS
  Filled 2016-12-30 (×7): qty 2
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
## 2016-12-30 NOTE — Progress Notes (Signed)
Pharmacy Antibiotic Note  Melissa Baird is a 76 y.o. female admitted on 12/30/2016 with sepsis.  Pharmacy has been consulted for Zosyn and Vancomycin dosing. Vancomycin 1 GM and Zosyn 3.375 GM given in ED  Plan: Vancomycin 750 IV every 12 hours.  Goal trough 15-20 mcg/mL. Zosyn 3.375g IV q8h (4 hour infusion).  Labs per protocol  Height: 5\' 4"  (162.6 cm) Weight: 160 lb (72.6 kg) IBW/kg (Calculated) : 54.7  Temp (24hrs), Avg:99.9 F (37.7 C), Min:98.6 F (37 C), Max:102 F (38.9 C)   Recent Labs Lab 12/30/16 1844 12/30/16 1846  WBC 6.5  --   CREATININE 0.96  --   LATICACIDVEN  --  1.2    Estimated Creatinine Clearance: 50.2 mL/min (by C-G formula based on SCr of 0.96 mg/dL).    No Known Allergies  Antimicrobials this admission: Vancomycin 1/19  >>  Zosyn 1/19 >>     Thank you for allowing pharmacy to be a part of this patient's care.  Chriss Czar 12/30/2016 8:31 PM

## 2016-12-31 LAB — BASIC METABOLIC PANEL
ANION GAP: 9 (ref 5–15)
BUN: 18 mg/dL (ref 6–20)
CO2: 23 mmol/L (ref 22–32)
Calcium: 8.2 mg/dL — ABNORMAL LOW (ref 8.9–10.3)
Chloride: 105 mmol/L (ref 101–111)
Creatinine, Ser: 0.91 mg/dL (ref 0.44–1.00)
GFR calc Af Amer: >60 mL/min (ref >60)
GLUCOSE: 146 mg/dL — AB (ref 65–99)
POTASSIUM: 3.6 mmol/L (ref 3.5–5.1)
SODIUM: 137 mmol/L (ref 135–145)

## 2016-12-31 MED ORDER — OSELTAMIVIR PHOSPHATE 30 MG PO CAPS
30.0000 mg | ORAL_CAPSULE | Freq: Two times a day (BID) | ORAL | Status: DC
  Administered 2016-12-31 – 2017-01-03 (×7): 30 mg via ORAL
  Filled 2016-12-31 (×11): qty 1

## 2016-12-31 NOTE — Progress Notes (Signed)
PHARMACY NOTE:   RENAL DOSAGE ADJUSTMENT   Current  dosage:  Tamiflu 75 mg po bid for 10 doses  Indication: positive influenza  Renal Function:  Estimated Creatinine Clearance: 52.5 mL/min (by C-G formula based on SCr of 0.91 mg/dL). []      On intermittent HD, scheduled: []      On CRRT    Dosage has been changed to:  Tamiflu 30 mg po bid (total of 10 doses of therapy)     Thank you for allowing pharmacy to be a part of this patient's care.  Gildardo Griffes [place], [facility] 12/31/2016 8:43 AM
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
## 2017-01-01 MED ORDER — ALBUTEROL SULFATE (2.5 MG/3ML) 0.083% IN NEBU: 2.5000 mg | INHALATION_SOLUTION | Freq: Four times a day (QID) | RESPIRATORY_TRACT | Status: DC | PRN
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
## 2017-01-02 LAB — CBC WITH DIFFERENTIAL/PLATELET
BASOS ABS: 0 10*3/uL (ref 0.0–0.1)
Basophils Relative: 0 %
EOS PCT: 0 %
Eosinophils Absolute: 0 10*3/uL (ref 0.0–0.7)
HEMATOCRIT: 44 % (ref 36.0–46.0)
Hemoglobin: 14.2 g/dL (ref 12.0–15.0)
LYMPHS PCT: 8 %
Lymphs Abs: 1.1 10*3/uL (ref 0.7–4.0)
MCH: 27.3 pg (ref 26.0–34.0)
MCHC: 32.3 g/dL (ref 30.0–36.0)
MCV: 84.5 fL (ref 78.0–100.0)
Monocytes Absolute: 0.5 10*3/uL (ref 0.1–1.0)
Monocytes Relative: 3 %
NEUTROS ABS: 12.8 10*3/uL — AB (ref 1.7–7.7)
Neutrophils Relative %: 89 %
PLATELETS: 276 10*3/uL (ref 150–400)
RBC: 5.21 MIL/uL — AB (ref 3.87–5.11)
RDW: 14.4 % (ref 11.5–15.5)
WBC: 14.4 10*3/uL — AB (ref 4.0–10.5)

## 2017-01-02 LAB — BASIC METABOLIC PANEL
ANION GAP: 10 (ref 5–15)
BUN: 19 mg/dL (ref 6–20)
CO2: 26 mmol/L (ref 22–32)
Calcium: 8.2 mg/dL — ABNORMAL LOW (ref 8.9–10.3)
Chloride: 100 mmol/L — ABNORMAL LOW (ref 101–111)
Creatinine, Ser: 0.81 mg/dL (ref 0.44–1.00)
GFR calc Af Amer: >60 mL/min (ref >60)
GLUCOSE: 126 mg/dL — AB (ref 65–99)
POTASSIUM: 3.8 mmol/L (ref 3.5–5.1)
Sodium: 136 mmol/L (ref 135–145)

## 2017-01-02 MED ORDER — IPRATROPIUM-ALBUTEROL 0.5-2.5 (3) MG/3ML IN SOLN
3.0000 mL | Freq: Three times a day (TID) | RESPIRATORY_TRACT | Status: DC
  Administered 2017-01-02 – 2017-01-03 (×3): 3 mL via RESPIRATORY_TRACT
  Filled 2017-01-02 (×4): qty 3

## 2017-01-02 MED ORDER — AMLODIPINE BESYLATE 5 MG PO TABS
5.0000 mg | ORAL_TABLET | Freq: Every day | ORAL | Status: DC
  Administered 2017-01-02 – 2017-01-03 (×2): 5 mg via ORAL
  Filled 2017-01-02 (×2): qty 1

## 2017-01-02 MED ORDER — AMLODIPINE BESYLATE 5 MG PO TABS
5.0000 mg | ORAL_TABLET | Freq: Once | ORAL | Status: AC
  Administered 2017-01-02: 5 mg via ORAL
  Filled 2017-01-02: qty 1

## 2017-01-02 MED ORDER — IPRATROPIUM-ALBUTEROL 0.5-2.5 (3) MG/3ML IN SOLN
3.0000 mL | Freq: Four times a day (QID) | RESPIRATORY_TRACT | Status: DC
  Administered 2017-01-02: 3 mL via RESPIRATORY_TRACT
  Filled 2017-01-02: qty 3

## 2017-01-02 NOTE — Clinical Social Work Note (Signed)
Clinical Social Work Assessment  Patient Details  Name: Melissa Baird MRN: [id number] Date of Birth: 09-01-1941  Date of referral:  01/02/17               Reason for consult:  Discharge Planning                Permission sought to share information with:  Chartered certified accountant granted to share information::  Yes, Verbal Permission Granted  Name::        Agency::  [facility]  Relationship::  facility  Contact Information:     Housing/Transportation Living arrangements for the past 2 months:  [place] of Information:  Adult Children, Facility Patient Interpreter Needed:  None Criminal Activity/Legal Involvement Pertinent to Current Situation/Hospitalization:  No - Comment as needed Significant Relationships:  Adult Children Lives with:  Facility Resident Do you feel safe going back to the place where you live?  Yes Need for family participation in patient care:  Yes (Comment)  Care giving concerns:  None reported. Pt is long term resident at ALF.     Social Worker assessment / plan:  CSW spoke with pt's daughter, Melissa Baird on phone as pt is oriented to self and place only at baseline. Pt has been a resident at [facility] for almost 2 years. Wanda visits regularly and is very involved and supportive. Per Tammy at facility, pt requires limited assist with bathing, dressing, and toileting. She ambulates independently using a railing. No home health prior to admission and pt is okay to return.   Employment status:  Retired Forensic scientist:  Medicare PT Recommendations:  Not assessed at this time [place] / Referral to community resources:  Other (Comment Required) (Return to [facility])  Patient/Family's Response to care:  Melissa Baird requests return to [facility] when medically stable.   Patient/Family's Understanding of and Emotional Response to Diagnosis, Current Treatment, and Prognosis:  Pt's daughter is a retired Therapist, sports. She is aware of  admission diagnosis and is on her way to the hospital this morning to visit pt.   Emotional Assessment Appearance:  Appears stated age Attitude/Demeanor/Rapport:  Unable to Assess Affect (typically observed):  Unable to Assess Orientation:  Oriented to Self, Oriented to Place Alcohol / Substance use:  Not Applicable Psych involvement (Current and /or in the community):  No (Comment)  Discharge Needs  Concerns to be addressed:  Discharge Planning Concerns Readmission within the last 30 days:  No Current discharge risk:  Cognitively Impaired Barriers to Discharge:  Continued Medical Work up   Salome Arnt, [place] 01/02/2017, 9:37 AM [phone]

## 2017-01-02 NOTE — Progress Notes (Signed)
Patient has influenza A on day 2 of Tamiflu 30 mg by mouth twice a day after 3 full days we'll discharge presumably if she is doing well tolerating food Melissa Baird [id number] DOB: 04/03/1941 DOA: 12/30/2016 PCP: Maricela Curet, MD   Physical Exam: Blood pressure 133/61, pulse 63, temperature 98.1 F (36.7 C), temperature source Oral, resp. rate 16, height 5\' 4"  (1.626 m), weight 71.2 kg (156 lb 15.5 oz), SpO2 95 %. Lungs clear to A&P no rales wheeze rhonchi heart regular rhythm no S3-S4 knee feels rubs abdomen soft nontender bowel sounds normoactive   Investigations:  No results found for this or any previous visit (from the past 240 hour(s)).   Basic Metabolic Panel:  Recent Labs  12/31/16 0638 01/02/17 0547  NA 137 136  K 3.6 3.8  CL 105 100*  CO2 23 26  GLUCOSE 146* 126*  BUN 18 19  CREATININE 0.91 0.81  CALCIUM 8.2* 8.2*   Liver Function Tests: No results for input(s): AST, ALT, ALKPHOS, BILITOT, PROT, ALBUMIN in the last 72 hours.   CBC:  Recent Labs  12/30/16 1844 01/02/17 0547  WBC 6.5 14.4*  NEUTROABS 5.1 12.8*  HGB 14.4 14.2  HCT 44.2 44.0  MCV 84.2 84.5  PLT 258 276    No results found.    Medications:   Impression:  Principal Problem:   Hypoxia Active Problems:   Essential hypertension   Dementia   Influenza     Plan: Continue Tamiflu orally consider discharge in 24 hours if she continues to improve  Consultants:    Procedures   Antibiotics:          Time spent: 30 minutes   LOS: 3 days   Joseph Johns M   01/02/2017, 1:59 PM

## 2017-01-03 ENCOUNTER — Ambulatory Visit: Payer: Medicare Other | Admitting: Cardiology

## 2017-01-03 MED ORDER — OSELTAMIVIR PHOSPHATE 30 MG PO CAPS: 30.0000 mg | ORAL_CAPSULE | Freq: Two times a day (BID) | ORAL | 0 refills | Status: AC

## 2017-01-03 MED ORDER — AMLODIPINE BESYLATE 5 MG PO TABS: 5.0000 mg | ORAL_TABLET | Freq: Every day | ORAL | 2 refills | Status: DC

## 2017-01-03 MED ORDER — OSELTAMIVIR PHOSPHATE 30 MG PO CAPS: 30.0000 mg | ORAL_CAPSULE | Freq: Two times a day (BID) | ORAL | 0 refills | Status: DC

## 2017-01-03 NOTE — NC FL2 (Addendum)
[place] LEVEL OF CARE SCREENING TOOL     IDENTIFICATION  Patient Name: Melissa Baird Birthdate: 1941/11/26 Sex: female Admission Date (Current Location): 12/30/2016  [place] and [place] Number:  [place] [id number] [place] and Address:  [facility] [address], [place]      Provider Number: [id number]  Attending Physician Name and Address:  Lucia Gaskins, MD  Relative Name and Phone Number:       Current Level of Care: Hospital Recommended Level of Care: [place] Prior Approval Number:    Date Approved/Denied:   PASRR Number: [id number]  Discharge Plan: Other (Comment) (return to ALF: [facility])    Current Diagnoses: Patient Active Problem List   Diagnosis Date Noted  . Influenza 12/30/2016  . Hypoxia 12/30/2016  . Loss of consciousness ([place]) 10/12/2016  . Chest pain 02/23/2016  . Dementia   . Unstable angina ([place]) 06/17/2013  . HLD (hyperlipidemia) 06/17/2013  . TOBACCO ABUSE 09/30/2010  . 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 [place] DISEASE, LUMBAR 09/14/2010  . Back pain, chronic 09/14/2010  . Depression 09/03/2010  . Essential hypertension 09/03/2010  . SYNCOPE 09/03/2010    Orientation RESPIRATION BLADDER Height & Weight     Self, Place  O2 (2L) Continent Weight: 156 lb 15.5 oz (71.2 kg) Height:  5\' 4"  (162.6 cm)  BEHAVIORAL SYMPTOMS/MOOD NEUROLOGICAL BOWEL NUTRITION STATUS  Other (Comment) (none)  (n/a) Continent Diet  AMBULATORY STATUS COMMUNICATION OF NEEDS Skin   Limited Assist Verbally Normal                       Personal Care Assistance Level of Assistance  Bathing, Feeding, Dressing Bathing Assistance: Limited assistance Feeding assistance: Limited assistance Dressing Assistance: Limited assistance     Functional Limitations Info  Sight, Hearing, Speech Sight Info: Adequate Hearing Info: Adequate Speech Info: Adequate    SPECIAL CARE FACTORS  FREQUENCY                       Contractures Contractures Info: Not present    Additional Factors Info  Code Status, Isolation Precautions, Allergies Code Status Info: DNR Allergies Info: NKA     Isolation Precautions Info: Droplet precautions     Current Medications (01/03/2017):  This is the current hospital active medication list Current Facility-Administered Medications  Medication Dose Route Frequency Provider Last Rate Last Dose  . acetaminophen (TYLENOL) tablet 650 mg  650 mg Oral Q6H PRN Truett Mainland, DO       Or  . acetaminophen (TYLENOL) suppository 650 mg  650 mg Rectal Q6H PRN Tanna Savoy Stinson, DO      . amLODipine (NORVASC) tablet 5 mg  5 mg Oral Daily Lucia Gaskins, MD   5 mg at 01/03/17 0846  . aspirin chewable tablet 81 mg  81 mg Oral Daily Tanna Savoy Stinson, DO   81 mg at 01/03/17 0846  . buPROPion (WELLBUTRIN XL) 24 hr tablet 300 mg  300 mg Oral Daily Tanna Savoy Stinson, DO   300 mg at 01/03/17 0846  . donepezil (ARICEPT) tablet 10 mg  10 mg Oral Daily Tanna Savoy Stinson, DO   10 mg at 01/03/17 0845  . enoxaparin (LOVENOX) injection 40 mg  40 mg Subcutaneous Q24H Tanna Savoy Stinson, DO   40 mg at 01/03/17 0049  . guaiFENesin (MUCINEX) 12 hr tablet 600 mg  600 mg Oral BID Truett Mainland, DO  600 mg at 01/03/17 0845  . ipratropium-albuterol (DUONEB) 0.5-2.5 (3) MG/3ML nebulizer solution 3 mL  3 mL Nebulization TID Lucia Gaskins, MD   3 mL at 01/03/17 0744  . isosorbide mononitrate (IMDUR) 24 hr tablet 90 mg  90 mg Oral Daily Tanna Savoy Stinson, DO   90 mg at 01/03/17 0845  . lisinopril (PRINIVIL,ZESTRIL) tablet 30 mg  30 mg Oral Daily Tanna Savoy Stinson, DO   30 mg at 01/03/17 0845  . memantine (NAMENDA) tablet 10 mg  10 mg Oral BID Tanna Savoy Stinson, DO   10 mg at 01/03/17 0846  . methylPREDNISolone sodium succinate (SOLU-MEDROL) 125 mg/2 mL injection 60 mg  60 mg Intravenous Q12H Tanna Savoy Stinson, DO   60 mg at 01/03/17 0844  . ondansetron (ZOFRAN) tablet 4 mg  4 mg Oral  Q6H PRN Tanna Savoy Stinson, DO       Or  . ondansetron [facility]) injection 4 mg  4 mg Intravenous Q6H PRN Tanna Savoy Stinson, DO      . oseltamivir (TAMIFLU) capsule 30 mg  30 mg Oral BID Lucia Gaskins, MD   30 mg at 01/03/17 0846  . pantoprazole (PROTONIX) EC tablet 40 mg  40 mg Oral Daily Tanna Savoy Stinson, DO   40 mg at 01/03/17 0846  . pravastatin (PRAVACHOL) tablet 40 mg  40 mg Oral QPM Tanna Savoy Stinson, DO   40 mg at 01/02/17 1732     Discharge Medications: Patient is instructed to take 2 new medicines upon discharge 1 his Norvasc 5 mg by mouth daily for control of hypertension as well as Tamiflu 30 mg by mouth twice a day for an additional 3 days in assisted living likewise she is instructed to take off her prehospital admission medicines Relevant Imaging Results:  Relevant Lab Results:   Additional Information SSN: [id number] [place], [place]
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
## 2017-01-03 NOTE — Progress Notes (Addendum)
2:39 PM Spoke with facility.  Agreeable for patient to return anytime. Daughter/son in room to transport patient to facility.  No other needs. DC to ALF.  LCSW made aware by MD that patient is medically stable for discharge. Patient to return to [facility]. Spoke with Erline Levine at [facility] regarding return.  Reports admissions has stepped out but aware of patient and will review notes regarding return.  Awaiting call back from facility with regards to discharge.  Lane Hacker, MSW Clinical Social Work: [employer] Coverage for :  [phone]

## 2017-01-03 NOTE — Progress Notes (Signed)
Patient's family states understanding of discharge instructions 

## 2017-01-03 NOTE — Care Management Note (Signed)
Case Management Note  Patient Details  Name: Melissa Baird MRN: [id number] Date of Birth: 1941-10-06    Expected Discharge Date:  01/03/17               Expected Discharge Plan:  Assisted Living / Rest Home  In-House Referral:  Clinical Social Work  Discharge planning Services  CM Consult  Post Acute Care Choice:  Durable Medical Equipment Choice offered to:  Patient  DME Arranged:  Environmental consultant (rollator) DME Agency:  [place] Arranged:    [place] Agency:     Status of Service:  Completed, signed off  If discussed at [place] of Stay Meetings, dates discussed:    Additional Comments: Patient discharging today back to [facility], CSW made arrangements. Rollator delivered to patient provided by [facility] per patient request. No other CM needs.   Daevion Navarette, Chauncey Reading, RN 01/03/2017, 1:41 PM

## 2017-01-16 ENCOUNTER — Encounter: Payer: Self-pay | Admitting: Cardiology
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
## 2017-01-17 ENCOUNTER — Encounter: Payer: Self-pay | Admitting: Cardiology

## 2017-01-17 ENCOUNTER — Ambulatory Visit (INDEPENDENT_AMBULATORY_CARE_PROVIDER_SITE_OTHER): Payer: Medicare Other | Admitting: Cardiology

## 2017-01-17 VITALS — BP 134/64 | HR 95 | Ht 62.0 in | Wt 152.0 lb

## 2017-01-17 DIAGNOSIS — E782 Mixed hyperlipidemia: Secondary | ICD-10-CM

## 2017-01-17 DIAGNOSIS — I2510 Atherosclerotic heart disease of native coronary artery without angina pectoris: Secondary | ICD-10-CM

## 2017-01-17 DIAGNOSIS — G309 Alzheimer's disease, unspecified: Secondary | ICD-10-CM | POA: Diagnosis not present

## 2017-01-17 DIAGNOSIS — I10 Essential (primary) hypertension: Secondary | ICD-10-CM

## 2017-01-17 DIAGNOSIS — F0280 Dementia in other diseases classified elsewhere without behavioral disturbance: Secondary | ICD-10-CM

## 2017-01-17 NOTE — Patient Instructions (Signed)
Your physician wants you to follow-up in: 6 months Dr McDowell You will receive a reminder letter in the mail two months in advance. If you don't receive a letter, please call our office to schedule the follow-up appointment.    Your physician recommends that you continue on your current medications as directed. Please refer to the Current Medication list given to you today.     Thank you for choosing [facility] !        

## 2017-02-17 ENCOUNTER — Other Ambulatory Visit: Payer: Self-pay | Admitting: Adult Health

## 2017-03-10 ENCOUNTER — Encounter (HOSPITAL_COMMUNITY): Payer: Self-pay | Admitting: *Deleted

## 2017-03-10 ENCOUNTER — Emergency Department (HOSPITAL_COMMUNITY)
Admission: EM | Admit: 2017-03-10 | Discharge: 2017-03-10 | Disposition: A | Discharge status: Home / Self Care | Payer: Medicare Other | Attending: Emergency Medicine | Admitting: Emergency Medicine

## 2017-03-10 ENCOUNTER — Emergency Department (HOSPITAL_COMMUNITY): Payer: Medicare Other

## 2017-03-10 DIAGNOSIS — Z7982 Long term (current) use of aspirin: Secondary | ICD-10-CM | POA: Diagnosis not present

## 2017-03-10 DIAGNOSIS — R079 Chest pain, unspecified: Secondary | ICD-10-CM | POA: Insufficient documentation

## 2017-03-10 DIAGNOSIS — I2510 Atherosclerotic heart disease of native coronary artery without angina pectoris: Secondary | ICD-10-CM | POA: Insufficient documentation

## 2017-03-10 DIAGNOSIS — G309 Alzheimer's disease, unspecified: Secondary | ICD-10-CM | POA: Diagnosis not present

## 2017-03-10 DIAGNOSIS — J449 Chronic obstructive pulmonary disease, unspecified: Secondary | ICD-10-CM | POA: Diagnosis not present

## 2017-03-10 DIAGNOSIS — Z87891 Personal history of nicotine dependence: Secondary | ICD-10-CM | POA: Diagnosis not present

## 2017-03-10 DIAGNOSIS — I10 Essential (primary) hypertension: Secondary | ICD-10-CM | POA: Insufficient documentation

## 2017-03-10 LAB — URINALYSIS, ROUTINE W REFLEX MICROSCOPIC
BILIRUBIN URINE: NEGATIVE
GLUCOSE, UA: NEGATIVE mg/dL
HGB URINE DIPSTICK: NEGATIVE
Ketones, ur: NEGATIVE mg/dL
Leukocytes, UA: NEGATIVE
Nitrite: NEGATIVE
PROTEIN: NEGATIVE mg/dL
Specific Gravity, Urine: 1.005 (ref 1.005–1.030)
pH: 8 (ref 5.0–8.0)

## 2017-03-10 LAB — CBC WITH DIFFERENTIAL/PLATELET
Basophils Absolute: 0 10*3/uL (ref 0.0–0.1)
Basophils Relative: 1 %
EOS ABS: 0.1 10*3/uL (ref 0.0–0.7)
EOS PCT: 1 %
HCT: 42.4 % (ref 36.0–46.0)
HEMOGLOBIN: 14.1 g/dL (ref 12.0–15.0)
Lymphocytes Relative: 38 %
Lymphs Abs: 2.9 10*3/uL (ref 0.7–4.0)
MCH: 27.8 pg (ref 26.0–34.0)
MCHC: 33.3 g/dL (ref 30.0–36.0)
MCV: 83.6 fL (ref 78.0–100.0)
MONO ABS: 0.4 10*3/uL (ref 0.1–1.0)
MONOS PCT: 6 %
NEUTROS PCT: 54 %
Neutro Abs: 4.2 10*3/uL (ref 1.7–7.7)
PLATELETS: 261 10*3/uL (ref 150–400)
RBC: 5.07 MIL/uL (ref 3.87–5.11)
RDW: 14.7 % (ref 11.5–15.5)
WBC: 7.6 10*3/uL (ref 4.0–10.5)

## 2017-03-10 LAB — BASIC METABOLIC PANEL
Anion gap: 6 (ref 5–15)
BUN: 15 mg/dL (ref 6–20)
CALCIUM: 9.6 mg/dL (ref 8.9–10.3)
CO2: 28 mmol/L (ref 22–32)
CREATININE: 0.82 mg/dL (ref 0.44–1.00)
Chloride: 105 mmol/L (ref 101–111)
GFR calc Af Amer: >60 mL/min (ref >60)
GLUCOSE: 106 mg/dL — AB (ref 65–99)
Potassium: 3.8 mmol/L (ref 3.5–5.1)
SODIUM: 139 mmol/L (ref 135–145)

## 2017-03-10 LAB — TROPONIN I

## 2017-03-10 MED ORDER — NITROGLYCERIN 0.4 MG SL SUBL: 0.4000 mg | SUBLINGUAL_TABLET | SUBLINGUAL | 12 refills | Status: AC | PRN

## 2017-03-10 MED ORDER — ASPIRIN 325 MG PO TABS
325.0000 mg | ORAL_TABLET | Freq: Once | ORAL | Status: AC
  Administered 2017-03-10: 325 mg via ORAL
  Filled 2017-03-10: qty 1

## 2017-03-10 NOTE — ED Notes (Signed)
EKG given to DR. Mesner.

## 2017-03-10 NOTE — ED Notes (Signed)
Pt made aware to return if symptoms worsen or if any life threatening symptoms occur.  Pt leaving with family back to [facility].

## 2017-03-10 NOTE — ED Triage Notes (Signed)
Pt from [facility] sent here for mid CP and SOB for an hour.  Pt in no distress at this time.
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
## 2017-03-10 NOTE — ED Notes (Signed)
New pullup placed on pt.

## 2017-05-03 ENCOUNTER — Emergency Department (HOSPITAL_COMMUNITY)
Admission: EM | Admit: 2017-05-03 | Discharge: 2017-05-03 | Disposition: A | Discharge status: Home / Self Care | Payer: Medicare Other | Attending: Emergency Medicine | Admitting: Emergency Medicine

## 2017-05-03 ENCOUNTER — Encounter (HOSPITAL_COMMUNITY): Payer: Self-pay

## 2017-05-03 ENCOUNTER — Emergency Department (HOSPITAL_COMMUNITY): Payer: Medicare Other

## 2017-05-03 DIAGNOSIS — Z79899 Other long term (current) drug therapy: Secondary | ICD-10-CM | POA: Insufficient documentation

## 2017-05-03 DIAGNOSIS — J449 Chronic obstructive pulmonary disease, unspecified: Secondary | ICD-10-CM | POA: Diagnosis not present

## 2017-05-03 DIAGNOSIS — I10 Essential (primary) hypertension: Secondary | ICD-10-CM | POA: Diagnosis not present

## 2017-05-03 DIAGNOSIS — Z87891 Personal history of nicotine dependence: Secondary | ICD-10-CM | POA: Diagnosis not present

## 2017-05-03 DIAGNOSIS — R079 Chest pain, unspecified: Secondary | ICD-10-CM

## 2017-05-03 DIAGNOSIS — F0390 Unspecified dementia without behavioral disturbance: Secondary | ICD-10-CM | POA: Diagnosis not present

## 2017-05-03 DIAGNOSIS — Z7982 Long term (current) use of aspirin: Secondary | ICD-10-CM | POA: Insufficient documentation

## 2017-05-03 DIAGNOSIS — I2510 Atherosclerotic heart disease of native coronary artery without angina pectoris: Secondary | ICD-10-CM | POA: Insufficient documentation

## 2017-05-03 LAB — BASIC METABOLIC PANEL
Anion gap: 8 (ref 5–15)
BUN: 23 mg/dL — ABNORMAL HIGH (ref 6–20)
CHLORIDE: 102 mmol/L (ref 101–111)
CO2: 28 mmol/L (ref 22–32)
CREATININE: 1.12 mg/dL — AB (ref 0.44–1.00)
Calcium: 9.3 mg/dL (ref 8.9–10.3)
GFR calc non Af Amer: 47 mL/min — ABNORMAL LOW (ref >60)
GFR, EST AFRICAN AMERICAN: 55 mL/min — AB (ref >60)
Glucose, Bld: 102 mg/dL — ABNORMAL HIGH (ref 65–99)
POTASSIUM: 3.5 mmol/L (ref 3.5–5.1)
SODIUM: 138 mmol/L (ref 135–145)

## 2017-05-03 LAB — CBC
HCT: 43.9 % (ref 36.0–46.0)
HEMOGLOBIN: 14.4 g/dL (ref 12.0–15.0)
MCH: 27.5 pg (ref 26.0–34.0)
MCHC: 32.8 g/dL (ref 30.0–36.0)
MCV: 83.9 fL (ref 78.0–100.0)
PLATELETS: 243 10*3/uL (ref 150–400)
RBC: 5.23 MIL/uL — AB (ref 3.87–5.11)
RDW: 14.3 % (ref 11.5–15.5)
WBC: 7.6 10*3/uL (ref 4.0–10.5)

## 2017-05-03 LAB — TROPONIN I
Troponin I: <0.03 ng/mL (ref <0.03)
Troponin I: <0.03 ng/mL (ref <0.03)

## 2017-05-03 NOTE — ED Triage Notes (Signed)
Pt brought in by EMS from [facility] with complaints of chest pain. Pt unable to tell me what she was doing when CP began. Pt was given nitro x 2 and asa en route and now reports she is pain free

## 2017-05-03 NOTE — ED Notes (Signed)
Pt returned from xray
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
## 2017-08-03 ENCOUNTER — Ambulatory Visit: Payer: Medicare Other | Admitting: Cardiology

## 2017-08-22 ENCOUNTER — Other Ambulatory Visit: Payer: Self-pay | Admitting: Cardiology

## 2017-08-25 ENCOUNTER — Ambulatory Visit: Payer: Medicare Other | Admitting: Cardiology
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
## 2017-09-05 ENCOUNTER — Ambulatory Visit (INDEPENDENT_AMBULATORY_CARE_PROVIDER_SITE_OTHER): Payer: Medicare Other | Admitting: Cardiology

## 2017-09-05 ENCOUNTER — Encounter: Payer: Self-pay | Admitting: Cardiology

## 2017-09-05 VITALS — BP 124/86 | HR 76 | Wt 143.0 lb

## 2017-09-05 DIAGNOSIS — G309 Alzheimer's disease, unspecified: Secondary | ICD-10-CM | POA: Diagnosis not present

## 2017-09-05 DIAGNOSIS — I10 Essential (primary) hypertension: Secondary | ICD-10-CM | POA: Diagnosis not present

## 2017-09-05 DIAGNOSIS — I2510 Atherosclerotic heart disease of native coronary artery without angina pectoris: Secondary | ICD-10-CM

## 2017-09-05 DIAGNOSIS — F0280 Dementia in other diseases classified elsewhere without behavioral disturbance: Secondary | ICD-10-CM | POA: Diagnosis not present

## 2017-09-05 DIAGNOSIS — E782 Mixed hyperlipidemia: Secondary | ICD-10-CM

## 2017-09-05 NOTE — Patient Instructions (Signed)
Your physician wants you to follow-up in: 1 year with Dr.McDowell You will receive a reminder letter in the mail two months in advance. If you don't receive a letter, please call our office to schedule the follow-up appointment.      Your physician recommends that you continue on your current medications as directed. Please refer to the Current Medication list given to you today.    If you need a refill on your cardiac medications before your next appointment, please call your pharmacy.      No lab work or testing ordered today.        Thank you for choosing [facility] !          

## 2017-09-13 ENCOUNTER — Emergency Department (HOSPITAL_COMMUNITY): Payer: Medicare Other

## 2017-09-13 ENCOUNTER — Emergency Department (HOSPITAL_COMMUNITY)
Admission: EM | Admit: 2017-09-13 | Discharge: 2017-09-13 | Disposition: A | Discharge status: Home / Self Care | Payer: Medicare Other | Attending: Emergency Medicine | Admitting: Emergency Medicine

## 2017-09-13 ENCOUNTER — Encounter (HOSPITAL_COMMUNITY): Payer: Self-pay | Admitting: Emergency Medicine

## 2017-09-13 DIAGNOSIS — Z79899 Other long term (current) drug therapy: Secondary | ICD-10-CM | POA: Diagnosis not present

## 2017-09-13 DIAGNOSIS — R4182 Altered mental status, unspecified: Secondary | ICD-10-CM | POA: Diagnosis not present

## 2017-09-13 DIAGNOSIS — Z7982 Long term (current) use of aspirin: Secondary | ICD-10-CM | POA: Insufficient documentation

## 2017-09-13 DIAGNOSIS — I10 Essential (primary) hypertension: Secondary | ICD-10-CM | POA: Insufficient documentation

## 2017-09-13 DIAGNOSIS — Z87891 Personal history of nicotine dependence: Secondary | ICD-10-CM | POA: Diagnosis not present

## 2017-09-13 DIAGNOSIS — I249 Acute ischemic heart disease, unspecified: Secondary | ICD-10-CM | POA: Diagnosis not present

## 2017-09-13 DIAGNOSIS — F0390 Unspecified dementia without behavioral disturbance: Secondary | ICD-10-CM | POA: Diagnosis not present

## 2017-09-13 LAB — COMPREHENSIVE METABOLIC PANEL
ALK PHOS: 74 U/L (ref 38–126)
ALT: 17 U/L (ref 14–54)
AST: 20 U/L (ref 15–41)
Albumin: 4 g/dL (ref 3.5–5.0)
Anion gap: 7 (ref 5–15)
BUN: 22 mg/dL — AB (ref 6–20)
CO2: 27 mmol/L (ref 22–32)
CREATININE: 1.32 mg/dL — AB (ref 0.44–1.00)
Calcium: 9.4 mg/dL (ref 8.9–10.3)
Chloride: 106 mmol/L (ref 101–111)
GFR, EST AFRICAN AMERICAN: 45 mL/min — AB (ref >60)
GFR, EST NON AFRICAN AMERICAN: 39 mL/min — AB (ref >60)
Glucose, Bld: 108 mg/dL — ABNORMAL HIGH (ref 65–99)
Potassium: 3.8 mmol/L (ref 3.5–5.1)
Sodium: 140 mmol/L (ref 135–145)
Total Bilirubin: 0.5 mg/dL (ref 0.3–1.2)
Total Protein: 7 g/dL (ref 6.5–8.1)

## 2017-09-13 LAB — CBC WITH DIFFERENTIAL/PLATELET
Basophils Absolute: 0 10*3/uL (ref 0.0–0.1)
Basophils Relative: 0 %
EOS ABS: 0.1 10*3/uL (ref 0.0–0.7)
EOS PCT: 1 %
HCT: 43.3 % (ref 36.0–46.0)
HEMOGLOBIN: 14.2 g/dL (ref 12.0–15.0)
LYMPHS ABS: 2.6 10*3/uL (ref 0.7–4.0)
Lymphocytes Relative: 35 %
MCH: 27.9 pg (ref 26.0–34.0)
MCHC: 32.8 g/dL (ref 30.0–36.0)
MCV: 85.1 fL (ref 78.0–100.0)
MONOS PCT: 6 %
Monocytes Absolute: 0.5 10*3/uL (ref 0.1–1.0)
NEUTROS PCT: 58 %
Neutro Abs: 4.4 10*3/uL (ref 1.7–7.7)
PLATELETS: 233 10*3/uL (ref 150–400)
RBC: 5.09 MIL/uL (ref 3.87–5.11)
RDW: 14.7 % (ref 11.5–15.5)
WBC: 7.6 10*3/uL (ref 4.0–10.5)

## 2017-09-13 LAB — URINALYSIS, ROUTINE W REFLEX MICROSCOPIC
BILIRUBIN URINE: NEGATIVE
Glucose, UA: NEGATIVE mg/dL
HGB URINE DIPSTICK: NEGATIVE
KETONES UR: NEGATIVE mg/dL
Leukocytes, UA: NEGATIVE
NITRITE: NEGATIVE
PROTEIN: NEGATIVE mg/dL
SPECIFIC GRAVITY, URINE: 1.019 (ref 1.005–1.030)
pH: 6 (ref 5.0–8.0)

## 2017-09-13 NOTE — ED Triage Notes (Signed)
PT arrives with [facility] ALF staff for c/o generalized weakness and hallucinations at times x3 days. PT also has had sinus congestion over the past week. PT has dementia and denies any pain at this time.

## 2017-09-13 NOTE — ED Notes (Signed)
Pt ambulated to bathroom with no issues.  Unable to urinate at this time.

## 2017-09-13 NOTE — Discharge Instructions (Signed)
At today's workup without any acute findings. Return for any new or worse symptoms. MRI appears not to be an option.
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
## 2018-01-02 ENCOUNTER — Other Ambulatory Visit: Payer: Self-pay

## 2018-01-02 ENCOUNTER — Emergency Department (HOSPITAL_COMMUNITY)
Admission: EM | Admit: 2018-01-02 | Discharge: 2018-01-02 | Disposition: A | Discharge status: Home / Self Care | Payer: Medicare Other | Attending: Emergency Medicine | Admitting: Emergency Medicine

## 2018-01-02 ENCOUNTER — Encounter (HOSPITAL_COMMUNITY): Payer: Self-pay | Admitting: Emergency Medicine

## 2018-01-02 DIAGNOSIS — I2510 Atherosclerotic heart disease of native coronary artery without angina pectoris: Secondary | ICD-10-CM | POA: Diagnosis not present

## 2018-01-02 DIAGNOSIS — G309 Alzheimer's disease, unspecified: Secondary | ICD-10-CM | POA: Diagnosis not present

## 2018-01-02 DIAGNOSIS — J449 Chronic obstructive pulmonary disease, unspecified: Secondary | ICD-10-CM | POA: Insufficient documentation

## 2018-01-02 DIAGNOSIS — R5381 Other malaise: Secondary | ICD-10-CM

## 2018-01-02 DIAGNOSIS — Z79899 Other long term (current) drug therapy: Secondary | ICD-10-CM | POA: Diagnosis not present

## 2018-01-02 DIAGNOSIS — R197 Diarrhea, unspecified: Secondary | ICD-10-CM | POA: Diagnosis not present

## 2018-01-02 DIAGNOSIS — Z87891 Personal history of nicotine dependence: Secondary | ICD-10-CM | POA: Insufficient documentation

## 2018-01-02 DIAGNOSIS — R531 Weakness: Secondary | ICD-10-CM | POA: Insufficient documentation

## 2018-01-02 DIAGNOSIS — I10 Essential (primary) hypertension: Secondary | ICD-10-CM | POA: Insufficient documentation

## 2018-01-02 LAB — COMPREHENSIVE METABOLIC PANEL
ALT: 19 U/L (ref 14–54)
AST: 18 U/L (ref 15–41)
Albumin: 4 g/dL (ref 3.5–5.0)
Alkaline Phosphatase: 86 U/L (ref 38–126)
Anion gap: 12 (ref 5–15)
BUN: 30 mg/dL — AB (ref 6–20)
CHLORIDE: 105 mmol/L (ref 101–111)
CO2: 24 mmol/L (ref 22–32)
CREATININE: 1.03 mg/dL — AB (ref 0.44–1.00)
Calcium: 9 mg/dL (ref 8.9–10.3)
GFR calc Af Amer: >60 mL/min (ref >60)
GFR, EST NON AFRICAN AMERICAN: 52 mL/min — AB (ref >60)
GLUCOSE: 107 mg/dL — AB (ref 65–99)
POTASSIUM: 3.6 mmol/L (ref 3.5–5.1)
Sodium: 141 mmol/L (ref 135–145)
Total Bilirubin: 0.6 mg/dL (ref 0.3–1.2)
Total Protein: 7.4 g/dL (ref 6.5–8.1)

## 2018-01-02 LAB — LIPASE, BLOOD: LIPASE: 20 U/L (ref 11–51)

## 2018-01-02 LAB — URINALYSIS, ROUTINE W REFLEX MICROSCOPIC
BACTERIA UA: NONE SEEN
BILIRUBIN URINE: NEGATIVE
Glucose, UA: NEGATIVE mg/dL
Hgb urine dipstick: NEGATIVE
Ketones, ur: NEGATIVE mg/dL
Nitrite: NEGATIVE
PH: 5 (ref 5.0–8.0)
Protein, ur: 30 mg/dL — AB
SPECIFIC GRAVITY, URINE: 1.028 (ref 1.005–1.030)

## 2018-01-02 LAB — CBC
HEMATOCRIT: 47.8 % — AB (ref 36.0–46.0)
Hemoglobin: 15.2 g/dL — ABNORMAL HIGH (ref 12.0–15.0)
MCH: 27.5 pg (ref 26.0–34.0)
MCHC: 31.8 g/dL (ref 30.0–36.0)
MCV: 86.6 fL (ref 78.0–100.0)
PLATELETS: 255 10*3/uL (ref 150–400)
RBC: 5.52 MIL/uL — ABNORMAL HIGH (ref 3.87–5.11)
RDW: 15 % (ref 11.5–15.5)
WBC: 11.9 10*3/uL — AB (ref 4.0–10.5)

## 2018-01-02 MED ORDER — SODIUM CHLORIDE 0.9 % IV BOLUS (SEPSIS)
500.0000 mL | Freq: Once | INTRAVENOUS | Status: AC
  Administered 2018-01-02: 500 mL via INTRAVENOUS

## 2018-01-02 NOTE — ED Triage Notes (Signed)
Caregiver reports patient has had diarrhea for the past 3 days. Patient told caregiver she "didn't feel right."

## 2018-01-02 NOTE — Discharge Instructions (Signed)
Encourage her to drink plenty of fluids, since she is having diarrhea.  At the diarrhea persists more than 2 or 3 days, use Kaopectate or Imodium, to slow it down.  Return here, if needed, for problems.

## 2018-01-02 NOTE — ED Notes (Signed)
Pt watery stool assisted nurse with cleaning up pt

## 2018-01-02 NOTE — ED Notes (Signed)
Pt had an episode of watery stool. Have cleaned up pt.
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
## 2018-01-24 ENCOUNTER — Emergency Department (HOSPITAL_COMMUNITY): Payer: Medicare Other

## 2018-01-24 ENCOUNTER — Encounter (HOSPITAL_COMMUNITY): Payer: Self-pay | Admitting: Emergency Medicine

## 2018-01-24 ENCOUNTER — Inpatient Hospital Stay (HOSPITAL_COMMUNITY)
Admission: EM | Admit: 2018-01-24 | Discharge: 2018-01-31 | DRG: 872 | Disposition: A | Discharge status: Nursing Home (Includes ICF) | Payer: Medicare Other | Attending: Family Medicine | Admitting: Family Medicine

## 2018-01-24 ENCOUNTER — Other Ambulatory Visit: Payer: Self-pay

## 2018-01-24 DIAGNOSIS — N3001 Acute cystitis with hematuria: Secondary | ICD-10-CM

## 2018-01-24 DIAGNOSIS — M5137 Other intervertebral disc degeneration, lumbosacral region: Secondary | ICD-10-CM

## 2018-01-24 DIAGNOSIS — E785 Hyperlipidemia, unspecified: Secondary | ICD-10-CM | POA: Diagnosis present

## 2018-01-24 DIAGNOSIS — F320 Major depressive disorder, single episode, mild: Secondary | ICD-10-CM

## 2018-01-24 DIAGNOSIS — Z87891 Personal history of nicotine dependence: Secondary | ICD-10-CM | POA: Diagnosis not present

## 2018-01-24 DIAGNOSIS — Z7983 Long term (current) use of bisphosphonates: Secondary | ICD-10-CM | POA: Diagnosis not present

## 2018-01-24 DIAGNOSIS — A4151 Sepsis due to Escherichia coli [E. coli]: Principal | ICD-10-CM

## 2018-01-24 DIAGNOSIS — I10 Essential (primary) hypertension: Secondary | ICD-10-CM

## 2018-01-24 DIAGNOSIS — F17200 Nicotine dependence, unspecified, uncomplicated: Secondary | ICD-10-CM

## 2018-01-24 DIAGNOSIS — Z79899 Other long term (current) drug therapy: Secondary | ICD-10-CM

## 2018-01-24 DIAGNOSIS — I4892 Unspecified atrial flutter: Secondary | ICD-10-CM | POA: Diagnosis present

## 2018-01-24 DIAGNOSIS — R000 Tachycardia, unspecified: Secondary | ICD-10-CM | POA: Diagnosis not present

## 2018-01-24 DIAGNOSIS — I452 Bifascicular block: Secondary | ICD-10-CM | POA: Diagnosis present

## 2018-01-24 DIAGNOSIS — Z7982 Long term (current) use of aspirin: Secondary | ICD-10-CM | POA: Diagnosis not present

## 2018-01-24 DIAGNOSIS — F329 Major depressive disorder, single episode, unspecified: Secondary | ICD-10-CM | POA: Diagnosis present

## 2018-01-24 DIAGNOSIS — N12 Tubulo-interstitial nephritis, not specified as acute or chronic: Secondary | ICD-10-CM

## 2018-01-24 DIAGNOSIS — G309 Alzheimer's disease, unspecified: Secondary | ICD-10-CM | POA: Diagnosis present

## 2018-01-24 DIAGNOSIS — F0280 Dementia in other diseases classified elsewhere without behavioral disturbance: Secondary | ICD-10-CM | POA: Diagnosis present

## 2018-01-24 DIAGNOSIS — Z981 Arthrodesis status: Secondary | ICD-10-CM | POA: Diagnosis not present

## 2018-01-24 DIAGNOSIS — L8990 Pressure ulcer of unspecified site, unspecified stage: Secondary | ICD-10-CM

## 2018-01-24 DIAGNOSIS — M5440 Lumbago with sciatica, unspecified side: Secondary | ICD-10-CM

## 2018-01-24 DIAGNOSIS — L89211 Pressure ulcer of right hip, stage 1: Secondary | ICD-10-CM | POA: Diagnosis present

## 2018-01-24 DIAGNOSIS — N3000 Acute cystitis without hematuria: Secondary | ICD-10-CM

## 2018-01-24 DIAGNOSIS — G8929 Other chronic pain: Secondary | ICD-10-CM

## 2018-01-24 DIAGNOSIS — J449 Chronic obstructive pulmonary disease, unspecified: Secondary | ICD-10-CM | POA: Diagnosis present

## 2018-01-24 DIAGNOSIS — N390 Urinary tract infection, site not specified: Secondary | ICD-10-CM | POA: Diagnosis not present

## 2018-01-24 DIAGNOSIS — E7801 Familial hypercholesterolemia: Secondary | ICD-10-CM

## 2018-01-24 DIAGNOSIS — I2510 Atherosclerotic heart disease of native coronary artery without angina pectoris: Secondary | ICD-10-CM | POA: Diagnosis present

## 2018-01-24 DIAGNOSIS — F0150 Vascular dementia without behavioral disturbance: Secondary | ICD-10-CM

## 2018-01-24 DIAGNOSIS — M51379 Other intervertebral disc degeneration, lumbosacral region without mention of lumbar back pain or lower extremity pain: Secondary | ICD-10-CM

## 2018-01-24 DIAGNOSIS — I200 Unstable angina: Secondary | ICD-10-CM

## 2018-01-24 DIAGNOSIS — R4182 Altered mental status, unspecified: Secondary | ICD-10-CM | POA: Diagnosis present

## 2018-01-24 DIAGNOSIS — K219 Gastro-esophageal reflux disease without esophagitis: Secondary | ICD-10-CM | POA: Diagnosis present

## 2018-01-24 DIAGNOSIS — A419 Sepsis, unspecified organism: Secondary | ICD-10-CM

## 2018-01-24 LAB — I-STAT CG4 LACTIC ACID, ED: Lactic Acid, Venous: 2.06 mmol/L (ref 0.5–1.9)

## 2018-01-24 LAB — INFLUENZA PANEL BY PCR (TYPE A & B)
INFLAPCR: NEGATIVE
Influenza B By PCR: NEGATIVE

## 2018-01-24 LAB — URINALYSIS, ROUTINE W REFLEX MICROSCOPIC
BILIRUBIN URINE: NEGATIVE
GLUCOSE, UA: NEGATIVE mg/dL
HGB URINE DIPSTICK: NEGATIVE
Ketones, ur: NEGATIVE mg/dL
NITRITE: NEGATIVE
PROTEIN: 100 mg/dL — AB
Specific Gravity, Urine: 1.018 (ref 1.005–1.030)
pH: 5 (ref 5.0–8.0)

## 2018-01-24 LAB — COMPREHENSIVE METABOLIC PANEL
ALBUMIN: 3.4 g/dL — AB (ref 3.5–5.0)
ALT: 14 U/L (ref 14–54)
ANION GAP: 13 (ref 5–15)
AST: 21 U/L (ref 15–41)
Alkaline Phosphatase: 89 U/L (ref 38–126)
BUN: 27 mg/dL — ABNORMAL HIGH (ref 6–20)
CO2: 24 mmol/L (ref 22–32)
CREATININE: 1.19 mg/dL — AB (ref 0.44–1.00)
Calcium: 8.8 mg/dL — ABNORMAL LOW (ref 8.9–10.3)
Chloride: 98 mmol/L — ABNORMAL LOW (ref 101–111)
GFR calc non Af Amer: 44 mL/min — ABNORMAL LOW (ref >60)
GFR, EST AFRICAN AMERICAN: 50 mL/min — AB (ref >60)
Glucose, Bld: 109 mg/dL — ABNORMAL HIGH (ref 65–99)
Potassium: 3.8 mmol/L (ref 3.5–5.1)
Sodium: 135 mmol/L (ref 135–145)
Total Bilirubin: 0.6 mg/dL (ref 0.3–1.2)
Total Protein: 7.3 g/dL (ref 6.5–8.1)

## 2018-01-24 LAB — CBC WITH DIFFERENTIAL/PLATELET
Basophils Absolute: 0 10*3/uL (ref 0.0–0.1)
Basophils Relative: 0 %
Eosinophils Absolute: 0 10*3/uL (ref 0.0–0.7)
Eosinophils Relative: 0 %
HEMATOCRIT: 44 % (ref 36.0–46.0)
HEMOGLOBIN: 13.8 g/dL (ref 12.0–15.0)
LYMPHS ABS: 1.3 10*3/uL (ref 0.7–4.0)
Lymphocytes Relative: 11 %
MCH: 27.1 pg (ref 26.0–34.0)
MCHC: 31.4 g/dL (ref 30.0–36.0)
MCV: 86.4 fL (ref 78.0–100.0)
MONOS PCT: 8 %
Monocytes Absolute: 0.9 10*3/uL (ref 0.1–1.0)
NEUTROS ABS: 9.6 10*3/uL — AB (ref 1.7–7.7)
NEUTROS PCT: 81 %
PLATELETS: 265 10*3/uL (ref 150–400)
RBC: 5.09 MIL/uL (ref 3.87–5.11)
RDW: 14.8 % (ref 11.5–15.5)
WBC: 11.8 10*3/uL — ABNORMAL HIGH (ref 4.0–10.5)

## 2018-01-24 LAB — LACTIC ACID, PLASMA: LACTIC ACID, VENOUS: 1.4 mmol/L (ref 0.5–1.9)

## 2018-01-24 LAB — TROPONIN I: TROPONIN I: 0.04 ng/mL — AB (ref <0.03)

## 2018-01-24 LAB — MRSA PCR SCREENING: MRSA BY PCR: NEGATIVE

## 2018-01-24 LAB — TSH: TSH: 0.872 u[IU]/mL (ref 0.350–4.500)

## 2018-01-24 LAB — RAPID STREP SCREEN (MED CTR MEBANE ONLY): Streptococcus, Group A Screen (Direct): NEGATIVE

## 2018-01-24 MED ORDER — LISINOPRIL 10 MG PO TABS
20.0000 mg | ORAL_TABLET | Freq: Every day | ORAL | Status: DC
  Administered 2018-01-25 – 2018-01-31 (×7): 20 mg via ORAL
  Filled 2018-01-24 (×7): qty 2

## 2018-01-24 MED ORDER — VANCOMYCIN HCL IN DEXTROSE 750-5 MG/150ML-% IV SOLN
750.0000 mg | INTRAVENOUS | Status: DC
  Filled 2018-01-24 (×2): qty 150

## 2018-01-24 MED ORDER — ISOSORBIDE MONONITRATE ER 60 MG PO TB24
90.0000 mg | ORAL_TABLET | Freq: Every day | ORAL | Status: DC
  Administered 2018-01-25 – 2018-01-31 (×7): 90 mg via ORAL
  Filled 2018-01-24 (×7): qty 2

## 2018-01-24 MED ORDER — ACETAMINOPHEN 500 MG PO TABS
1000.0000 mg | ORAL_TABLET | Freq: Once | ORAL | Status: AC
  Administered 2018-01-24: 1000 mg via ORAL
  Filled 2018-01-24: qty 2

## 2018-01-24 MED ORDER — DOCUSATE SODIUM 100 MG PO CAPS
100.0000 mg | ORAL_CAPSULE | Freq: Every day | ORAL | Status: DC
  Administered 2018-01-25 – 2018-01-31 (×7): 100 mg via ORAL
  Filled 2018-01-24 (×7): qty 1

## 2018-01-24 MED ORDER — PIPERACILLIN-TAZOBACTAM 3.375 G IVPB 30 MIN
3.3750 g | Freq: Once | INTRAVENOUS | Status: AC
  Administered 2018-01-24: 3.375 g via INTRAVENOUS
  Filled 2018-01-24: qty 50

## 2018-01-24 MED ORDER — BUPROPION HCL ER (XL) 300 MG PO TB24
300.0000 mg | ORAL_TABLET | Freq: Every day | ORAL | Status: DC
  Administered 2018-01-25 – 2018-01-31 (×7): 300 mg via ORAL
  Filled 2018-01-24 (×7): qty 1

## 2018-01-24 MED ORDER — PIPERACILLIN-TAZOBACTAM 3.375 G IVPB
3.3750 g | Freq: Three times a day (TID) | INTRAVENOUS | Status: DC
  Administered 2018-01-24 – 2018-01-25 (×2): 3.375 g via INTRAVENOUS
  Filled 2018-01-24 (×2): qty 50

## 2018-01-24 MED ORDER — VITAMIN D 1000 UNITS PO TABS
5000.0000 [IU] | ORAL_TABLET | Freq: Every day | ORAL | Status: DC
  Administered 2018-01-25 – 2018-01-31 (×7): 5000 [IU] via ORAL
  Filled 2018-01-24 (×7): qty 5

## 2018-01-24 MED ORDER — VANCOMYCIN HCL IN DEXTROSE 1-5 GM/200ML-% IV SOLN
1000.0000 mg | Freq: Once | INTRAVENOUS | Status: AC
  Administered 2018-01-24: 1000 mg via INTRAVENOUS
  Filled 2018-01-24: qty 200

## 2018-01-24 MED ORDER — MEMANTINE HCL 10 MG PO TABS
10.0000 mg | ORAL_TABLET | Freq: Two times a day (BID) | ORAL | Status: DC
  Administered 2018-01-24 – 2018-01-31 (×14): 10 mg via ORAL
  Filled 2018-01-24 (×14): qty 1

## 2018-01-24 MED ORDER — PANTOPRAZOLE SODIUM 40 MG PO TBEC
40.0000 mg | DELAYED_RELEASE_TABLET | Freq: Every day | ORAL | Status: DC
  Administered 2018-01-25 – 2018-01-31 (×7): 40 mg via ORAL
  Filled 2018-01-24 (×7): qty 1

## 2018-01-24 MED ORDER — DONEPEZIL HCL 5 MG PO TABS
10.0000 mg | ORAL_TABLET | Freq: Every day | ORAL | Status: DC
  Administered 2018-01-25 – 2018-01-31 (×7): 10 mg via ORAL
  Filled 2018-01-24 (×7): qty 2

## 2018-01-24 MED ORDER — ENOXAPARIN SODIUM 30 MG/0.3ML ~~LOC~~ SOLN
30.0000 mg | SUBCUTANEOUS | Status: DC
  Administered 2018-01-24: 30 mg via SUBCUTANEOUS
  Filled 2018-01-24: qty 0.3

## 2018-01-24 MED ORDER — QUETIAPINE FUMARATE 100 MG PO TABS
100.0000 mg | ORAL_TABLET | Freq: Every day | ORAL | Status: DC
  Administered 2018-01-24 – 2018-01-30 (×7): 100 mg via ORAL
  Filled 2018-01-24 (×7): qty 1

## 2018-01-24 MED ORDER — ACETAMINOPHEN 650 MG RE SUPP: 650.0000 mg | Freq: Four times a day (QID) | RECTAL | Status: DC | PRN

## 2018-01-24 MED ORDER — PRAVASTATIN SODIUM 40 MG PO TABS
40.0000 mg | ORAL_TABLET | Freq: Every evening | ORAL | Status: DC
  Administered 2018-01-24 – 2018-01-30 (×7): 40 mg via ORAL
  Filled 2018-01-24 (×7): qty 1

## 2018-01-24 MED ORDER — ASPIRIN 81 MG PO CHEW
81.0000 mg | CHEWABLE_TABLET | Freq: Every day | ORAL | Status: DC
  Administered 2018-01-25 – 2018-01-31 (×7): 81 mg via ORAL
  Filled 2018-01-24 (×7): qty 1

## 2018-01-24 MED ORDER — VITAMIN D3 125 MCG (5000 UT) PO CAPS
5000.0000 [IU] | ORAL_CAPSULE | Freq: Every day | ORAL | Status: DC
Start: 2018-01-24 — End: 2018-01-24

## 2018-01-24 MED ORDER — SODIUM CHLORIDE 0.9 % IV BOLUS (SEPSIS)
1000.0000 mL | Freq: Once | INTRAVENOUS | Status: AC
  Administered 2018-01-24: 1000 mL via INTRAVENOUS

## 2018-01-24 MED ORDER — ENSURE ENLIVE PO LIQD
237.0000 mL | Freq: Two times a day (BID) | ORAL | Status: DC
  Administered 2018-01-25 – 2018-01-31 (×10): 237 mL via ORAL

## 2018-01-24 MED ORDER — SODIUM CHLORIDE 0.9 % IV SOLN
INTRAVENOUS | Status: AC
  Administered 2018-01-24: 19:00:00 via INTRAVENOUS

## 2018-01-24 MED ORDER — ACETAMINOPHEN 325 MG PO TABS
650.0000 mg | ORAL_TABLET | Freq: Four times a day (QID) | ORAL | Status: DC | PRN
  Administered 2018-01-25: 650 mg via ORAL
  Filled 2018-01-24: qty 2

## 2018-01-24 NOTE — ED Triage Notes (Signed)
PT sent over from [facility]F today due to generalized weakness, shaking all over and fever of 102.7 upon arrival to ED. Pt has dementia and states she just doesn't feel good.

## 2018-01-24 NOTE — ED Notes (Signed)
Leda Gauze [facility]) called and told about pt's admission and that pt went to room 317. [phone].
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
## 2018-01-24 NOTE — Progress Notes (Addendum)
Pharmacy Note:  Initial antibiotic(s) regimen of Vancomycin and Zosyn ordered by EDP to treat sepsis.  Estimated Creatinine Clearance: 35.3 mL/min (A) (by C-G formula based on SCr of 1.19 mg/dL (H)).   No Known Allergies  Vitals:   01/24/18 1400 01/24/18 1430  BP:  105/61  Pulse: (!) 105 98  Resp: (!) 25 20  Temp:    SpO2: 93% 94%    Anti-infectives (From admission, onward)   Start     Dose/Rate Route Frequency Ordered Stop   01/24/18 1345  piperacillin-tazobactam (ZOSYN) IVPB 3.375 g     3.375 g 100 mL/hr over 30 Minutes Intravenous  Once 01/24/18 1337 01/24/18 1428   01/24/18 1345  vancomycin (VANCOCIN) IVPB 1000 mg/200 mL premix     1,000 mg 200 mL/hr over 60 Minutes Intravenous  Once 01/24/18 1337        Plan: Initial dose(s) of Vancomycin 1gm and Zosyn 3.375gm X 1 ordered. F/U admission orders for further dosing if therapy continued.  Ena Dawley, [facility] 01/24/2018 3:30 PM   Addum:  Continue vancomycin 750 mg IV q24 hours Continue zosyn 3.375 gm IV q8 hours F/u renal function, cultures and clinical course Excell Seltzer, PharmD
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
## 2018-01-24 NOTE — Progress Notes (Signed)
CRITICAL VALUE ALERT  Critical Value:  Troponin 0.04  Date & Time Notied:  01/24/18, 2204  Provider Notified: [place]  Orders Received/Actions taken:

## 2018-01-25 ENCOUNTER — Inpatient Hospital Stay (HOSPITAL_COMMUNITY): Payer: Medicare Other

## 2018-01-25 DIAGNOSIS — L8990 Pressure ulcer of unspecified site, unspecified stage: Secondary | ICD-10-CM

## 2018-01-25 LAB — BLOOD CULTURE ID PANEL (REFLEXED)
ACINETOBACTER BAUMANNII: NOT DETECTED
CANDIDA ALBICANS: NOT DETECTED
CANDIDA KRUSEI: NOT DETECTED
CANDIDA PARAPSILOSIS: NOT DETECTED
CANDIDA TROPICALIS: NOT DETECTED
Candida glabrata: NOT DETECTED
Carbapenem resistance: NOT DETECTED
ENTEROBACTER CLOACAE COMPLEX: NOT DETECTED
ENTEROBACTERIACEAE SPECIES: DETECTED — AB
Enterococcus species: NOT DETECTED
Escherichia coli: DETECTED — AB
HAEMOPHILUS INFLUENZAE: NOT DETECTED
KLEBSIELLA OXYTOCA: NOT DETECTED
KLEBSIELLA PNEUMONIAE: NOT DETECTED
Listeria monocytogenes: NOT DETECTED
Neisseria meningitidis: NOT DETECTED
PROTEUS SPECIES: NOT DETECTED
PSEUDOMONAS AERUGINOSA: NOT DETECTED
STREPTOCOCCUS PYOGENES: NOT DETECTED
STREPTOCOCCUS SPECIES: NOT DETECTED
Serratia marcescens: NOT DETECTED
Staphylococcus aureus (BCID): NOT DETECTED
Staphylococcus species: NOT DETECTED
Streptococcus agalactiae: NOT DETECTED
Streptococcus pneumoniae: NOT DETECTED

## 2018-01-25 LAB — RESPIRATORY PANEL BY PCR
ADENOVIRUS-RVPPCR: NOT DETECTED
BORDETELLA PERTUSSIS-RVPCR: NOT DETECTED
Chlamydophila pneumoniae: NOT DETECTED
Coronavirus 229E: NOT DETECTED
Coronavirus HKU1: NOT DETECTED
Coronavirus NL63: NOT DETECTED
Coronavirus OC43: NOT DETECTED
INFLUENZA A-RVPPCR: NOT DETECTED
INFLUENZA B-RVPPCR: NOT DETECTED
MYCOPLASMA PNEUMONIAE-RVPPCR: NOT DETECTED
Metapneumovirus: NOT DETECTED
PARAINFLUENZA VIRUS 3-RVPPCR: NOT DETECTED
PARAINFLUENZA VIRUS 4-RVPPCR: NOT DETECTED
Parainfluenza Virus 1: NOT DETECTED
Parainfluenza Virus 2: NOT DETECTED
RESPIRATORY SYNCYTIAL VIRUS-RVPPCR: NOT DETECTED
RHINOVIRUS / ENTEROVIRUS - RVPPCR: NOT DETECTED

## 2018-01-25 LAB — COMPREHENSIVE METABOLIC PANEL
ALBUMIN: 2.7 g/dL — AB (ref 3.5–5.0)
ALK PHOS: 78 U/L (ref 38–126)
ALT: 15 U/L (ref 14–54)
AST: 21 U/L (ref 15–41)
Anion gap: 11 (ref 5–15)
BUN: 20 mg/dL (ref 6–20)
CALCIUM: 8 mg/dL — AB (ref 8.9–10.3)
CHLORIDE: 107 mmol/L (ref 101–111)
CO2: 21 mmol/L — AB (ref 22–32)
CREATININE: 0.98 mg/dL (ref 0.44–1.00)
GFR calc Af Amer: >60 mL/min (ref >60)
GFR calc non Af Amer: 55 mL/min — ABNORMAL LOW (ref >60)
GLUCOSE: 100 mg/dL — AB (ref 65–99)
Potassium: 3.4 mmol/L — ABNORMAL LOW (ref 3.5–5.1)
SODIUM: 139 mmol/L (ref 135–145)
Total Bilirubin: 0.8 mg/dL (ref 0.3–1.2)
Total Protein: 5.9 g/dL — ABNORMAL LOW (ref 6.5–8.1)

## 2018-01-25 LAB — GLUCOSE, CAPILLARY: GLUCOSE-CAPILLARY: 85 mg/dL (ref 65–99)

## 2018-01-25 LAB — TROPONIN I
Troponin I: 0.03 ng/mL (ref <0.03)
Troponin I: 0.03 ng/mL (ref <0.03)

## 2018-01-25 LAB — CBC
HCT: 39.4 % (ref 36.0–46.0)
HEMOGLOBIN: 12.5 g/dL (ref 12.0–15.0)
MCH: 27.1 pg (ref 26.0–34.0)
MCHC: 31.7 g/dL (ref 30.0–36.0)
MCV: 85.3 fL (ref 78.0–100.0)
PLATELETS: 189 10*3/uL (ref 150–400)
RBC: 4.62 MIL/uL (ref 3.87–5.11)
RDW: 14.8 % (ref 11.5–15.5)
WBC: 8.9 10*3/uL (ref 4.0–10.5)

## 2018-01-25 MED ORDER — ENOXAPARIN SODIUM 40 MG/0.4ML ~~LOC~~ SOLN
40.0000 mg | SUBCUTANEOUS | Status: DC
  Administered 2018-01-25 – 2018-01-30 (×6): 40 mg via SUBCUTANEOUS
  Filled 2018-01-25 (×6): qty 0.4

## 2018-01-25 MED ORDER — MEROPENEM 1 G IV SOLR
1.0000 g | Freq: Two times a day (BID) | INTRAVENOUS | Status: DC
  Administered 2018-01-25 – 2018-01-26 (×3): 1 g via INTRAVENOUS
  Filled 2018-01-25 (×9): qty 1

## 2018-01-25 NOTE — Progress Notes (Signed)
CRITICAL VALUE ALERT  Critical Value:  Troponin 0.03  Date & Time Notied:  01/25/18, 8299  Provider Notified: Reesa Chew  Orders Received/Actions taken:

## 2018-01-25 NOTE — Progress Notes (Signed)
Aerobic blood culture positive for gram negative rods, MD made aware.

## 2018-01-25 NOTE — Progress Notes (Signed)
Nutrition Brief Note  Patient identified on the Malnutrition Screening Tool (MST) Report  Melissa Baird is a 77 yo fm with hx of dementia, CAD, COPD and HTN. She presents from [facility] with UTI and hypothermia. She is independent with ambulation per daughter.  Wt Readings from Last 15 Encounters:  01/25/18 141 lb 8.6 oz (64.2 kg)  09/13/17 143 lb (64.9 kg)  09/05/17 143 lb (64.9 kg)  05/03/17 150 lb (68 kg)  01/17/17 152 lb (68.9 kg)  12/30/16 156 lb 15.5 oz (71.2 kg)  12/13/16 152 lb (68.9 kg)  10/12/16 152 lb 9.6 oz (69.2 kg)  10/04/16 155 lb (70.3 kg)  09/27/16 155 lb (70.3 kg)  07/09/16 160 lb (72.6 kg)  06/17/16 161 lb (73 kg)  03/04/16 158 lb (71.7 kg)  02/26/16 148 lb (67.1 kg)  02/23/16 148 lb 13 oz (67.5 kg)   She has experienced some gradual weight loss over  the past 9 months 5% (8lb) decrease which is not significant for timeframe.  Body mass index is 24.29 kg/m. Patient meets criteria for normal range based on current BMI.   Current diet order is Heart healthy, patient is consuming approximately >75% of meals per daughter. Usually eats well daily at [facility] where she resides. Needs assistance with feeding all meals. Takes a Vitamin D and MVI daily. Ensure Enlive provided- 100% consumed (prefers chocolate).    BMP Latest Ref Rng & Units 01/25/2018 01/24/2018 01/02/2018  Glucose 65 - 99 mg/dL 100(H) 109(H) 107(H)  BUN 6 - 20 mg/dL 20 27(H) 30(H)  Creatinine 0.44 - 1.00 mg/dL 0.98 1.19(H) 1.03(H)  Sodium 135 - 145 mmol/L 139 135 141  Potassium 3.5 - 5.1 mmol/L 3.4(L) 3.8 3.6  Chloride 101 - 111 mmol/L 107 98(L) 105  CO2 22 - 32 mmol/L 21(L) 24 24  Calcium 8.9 - 10.3 mg/dL 8.0(L) 8.8(L) 9.0     Labs and medications reviewed.   No nutrition interventions warranted at this time. If nutrition issues arise, please consult RD.   Colman Cater Melissa,RD,CSG,LDN Office: [phone] Pager: [phone]
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
## 2018-01-25 NOTE — Progress Notes (Signed)
PHARMACY - PHYSICIAN COMMUNICATION CRITICAL VALUE ALERT - BLOOD CULTURE IDENTIFICATION (BCID)  [name] is an 77 y.o. female who presented to [facility] on 01/24/2018 with a chief complaint of fever and shaking  Assessment:  77 yo female with frequency of urination and chills who was shaking at [facility] and sent to ED for evaluation. Patient admitted with Urinary sepsis.  MRSA PCR is negative. BCID is positive for E.Coli bacteremia  Name of physician (or Provider) Contacted: Dr. Cindie Laroche  Current antibiotics: zosyn and Vancomycin  Changes to prescribed antibiotics recommended: D/w MD and will transition abx to Merrem 1 gm IV q12 F/U cxs and clinical progress Monitor V/S , labs. Deescalate as indicated Recommendations accepted by provider  Results for orders placed or performed during the hospital encounter of 01/24/18  Blood Culture ID Panel (Reflexed) (Collected: 01/24/2018  1:37 PM)  Result Value Ref Range   Enterococcus species NOT DETECTED NOT DETECTED   Listeria monocytogenes NOT DETECTED NOT DETECTED   Staphylococcus species NOT DETECTED NOT DETECTED   Staphylococcus aureus NOT DETECTED NOT DETECTED   Streptococcus species NOT DETECTED NOT DETECTED   Streptococcus agalactiae NOT DETECTED NOT DETECTED   Streptococcus pneumoniae NOT DETECTED NOT DETECTED   Streptococcus pyogenes NOT DETECTED NOT DETECTED   Acinetobacter baumannii NOT DETECTED NOT DETECTED   Enterobacteriaceae species DETECTED (A) NOT DETECTED   Enterobacter cloacae complex NOT DETECTED NOT DETECTED   Escherichia coli DETECTED (A) NOT DETECTED   Klebsiella oxytoca NOT DETECTED NOT DETECTED   Klebsiella pneumoniae NOT DETECTED NOT DETECTED   Proteus species NOT DETECTED NOT DETECTED   Serratia marcescens NOT DETECTED NOT DETECTED   Carbapenem resistance NOT DETECTED NOT DETECTED   Haemophilus influenzae NOT DETECTED NOT DETECTED   Neisseria meningitidis NOT DETECTED NOT DETECTED   Pseudomonas aeruginosa  NOT DETECTED NOT DETECTED   Candida albicans NOT DETECTED NOT DETECTED   Candida glabrata NOT DETECTED NOT DETECTED   Candida krusei NOT DETECTED NOT DETECTED   Candida parapsilosis NOT DETECTED NOT DETECTED   Candida tropicalis NOT DETECTED NOT DETECTED    Isac Sarna, BS [place]ena [place], BCPS Clinical Pharmacist Pager [phone] 01/25/2018  12:51 PM

## 2018-01-26 ENCOUNTER — Inpatient Hospital Stay (HOSPITAL_COMMUNITY): Payer: Medicare Other

## 2018-01-26 DIAGNOSIS — I2510 Atherosclerotic heart disease of native coronary artery without angina pectoris: Secondary | ICD-10-CM

## 2018-01-26 LAB — BASIC METABOLIC PANEL
ANION GAP: 12 (ref 5–15)
BUN: 16 mg/dL (ref 6–20)
CALCIUM: 8.2 mg/dL — AB (ref 8.9–10.3)
CO2: 23 mmol/L (ref 22–32)
Chloride: 105 mmol/L (ref 101–111)
Creatinine, Ser: 0.82 mg/dL (ref 0.44–1.00)
GFR calc non Af Amer: >60 mL/min (ref >60)
GLUCOSE: 78 mg/dL (ref 65–99)
POTASSIUM: 3.3 mmol/L — AB (ref 3.5–5.1)
Sodium: 140 mmol/L (ref 135–145)

## 2018-01-26 LAB — CBC WITH DIFFERENTIAL/PLATELET
BASOS PCT: 0 %
Basophils Absolute: 0 10*3/uL (ref 0.0–0.1)
Eosinophils Absolute: 0 10*3/uL (ref 0.0–0.7)
Eosinophils Relative: 0 %
HEMATOCRIT: 40.4 % (ref 36.0–46.0)
HEMOGLOBIN: 12.7 g/dL (ref 12.0–15.0)
LYMPHS PCT: 20 %
Lymphs Abs: 1.5 10*3/uL (ref 0.7–4.0)
MCH: 27 pg (ref 26.0–34.0)
MCHC: 31.4 g/dL (ref 30.0–36.0)
MCV: 86 fL (ref 78.0–100.0)
MONO ABS: 0.8 10*3/uL (ref 0.1–1.0)
Monocytes Relative: 10 %
NEUTROS ABS: 5.3 10*3/uL (ref 1.7–7.7)
NEUTROS PCT: 69 %
Platelets: 229 10*3/uL (ref 150–400)
RBC: 4.7 MIL/uL (ref 3.87–5.11)
RDW: 14.9 % (ref 11.5–15.5)
WBC: 7.6 10*3/uL (ref 4.0–10.5)

## 2018-01-26 LAB — URINE CULTURE: Culture: >=100000 — AB

## 2018-01-26 LAB — ECHOCARDIOGRAM COMPLETE
HEIGHTINCHES: 64 in
Weight: 2328.06 oz

## 2018-01-26 MED ORDER — SODIUM CHLORIDE 0.9 % IV SOLN
1.0000 g | Freq: Three times a day (TID) | INTRAVENOUS | Status: DC
  Administered 2018-01-26 – 2018-01-29 (×7): 1 g via INTRAVENOUS
  Filled 2018-01-26 (×12): qty 1

## 2018-01-26 MED ORDER — POTASSIUM CHLORIDE CRYS ER 20 MEQ PO TBCR
20.0000 meq | EXTENDED_RELEASE_TABLET | Freq: Every day | ORAL | Status: AC
  Administered 2018-01-26 – 2018-01-27 (×2): 20 meq via ORAL
  Filled 2018-01-26 (×2): qty 1

## 2018-01-26 NOTE — Care Management Important Message (Signed)
Important Message  Patient Details  Name: Melissa Baird MRN: [id number] Date of Birth: 02-Apr-1941   Medicare Important Message Given:  Yes    Sherald Barge, RN 01/26/2018, 12:27 PM

## 2018-01-26 NOTE — Clinical Social Work Note (Signed)
Clinical Social Work Assessment  Patient Details  Name: Melissa Baird MRN: [id number] Date of Birth: 05/20/1941  Date of referral:  01/26/18               Reason for consult:  Discharge Planning                Permission sought to share information with:    Permission granted to share information::     Name::        Agency::     Relationship::     Contact Information:     Housing/Transportation Living arrangements for the past 2 months:  [place] of Information:  Facility Patient Interpreter Needed:  None Criminal Activity/Legal Involvement Pertinent to Current Situation/Hospitalization:  No - Comment as needed Significant Relationships:  Adult Children, Other Family Members Lives with:  Facility Resident Do you feel safe going back to the place where you live?  Yes Need for family participation in patient care:  Yes (Comment)  Care giving concerns: Pt resides at [facility].   Social Worker assessment / plan: Pt admitted from [facility]. Spoke with Tammy at the ALF to update. Pt has dementia. She has been at the ALF for about two years. She ambulates independently but does hold on to the railings. Pt receives assistance with bathing, toileting, and dressing. Staff cut up pt's food but she does feed herself. Pt has good family support.   Employment status:  Retired Forensic scientist:  Medicare PT Recommendations:  Not assessed at this time Information / Referral to community resources:     Patient/Family's Response to care: Pt accepting of care.  Patient/Family's Understanding of and Emotional Response to Diagnosis, Current Treatment, and Prognosis: Pt has dementia. Family has good understanding of diagnosis and treatment recommendations.  Emotional Assessment Appearance:  Appears stated age Attitude/Demeanor/Rapport:    Affect (typically observed):  Accepting Orientation:  Oriented to Self Alcohol / Substance use:  Not  Applicable Psych involvement (Current and /or in the community):  No (Comment)  Discharge Needs  Concerns to be addressed:  Discharge Planning Concerns Readmission within the last 30 days:  No Current discharge risk:  None Barriers to Discharge:  No Barriers Identified   Shade Flood, LCSW 01/26/2018, 2:23 PM

## 2018-01-26 NOTE — Progress Notes (Signed)
Gram negative rods in urine , E Coli in blood C&S . Currently on merrem. Lactic acid 1.4  Melissa Baird NLZ:[id number] DOB: 1941/08/05 DOA: 01/24/2018 PCP: Lucia Gaskins, MD   Physical Exam: Blood pressure (!) 146/66, pulse (!) 107, temperature 98.6 F (37 C), resp. rate 18, height 5\' 4"  (1.626 m), weight 66 kg (145 lb 8.1 oz), SpO2 94 %.Lungs- No rales , wheeeze or ronchi. Heart - RR no S3, S4 No M H T or R.   Investigations:  Recent Results (from the past 240 hour(s))  Blood Culture (routine x 2)     Status: Abnormal (Preliminary result)   Collection Time: 01/24/18  1:37 PM  Result Value Ref Range Status   Specimen Description   Final    BLOOD RIGHT ARM DRAWN BY RN Performed at [facility], [address], [place], [place] [address]    Special Requests   Final    BOTTLES DRAWN AEROBIC AND ANAEROBIC Blood Culture adequate volume Performed at [facility], [address], [place], [place] [address]    Culture  Setup Time   Final    CORRECTED RESULTS PREVIOUSLY REPORTED AS: GRAM POSITIVE COCCI AEROBIC BOTTLE ONLY CORRECTED RESULTS CALLED TO: L. SEAY PHARMD, AT 0751 01/25/18 BY D. Rico Sheehan NEGATIVE RODS    Culture (A)  Final    ESCHERICHIA COLI SUSCEPTIBILITIES TO FOLLOW Performed at [facility], [place] [address] [place], [place] [address]    Report Status PENDING  Incomplete  Urine culture     Status: Abnormal (Preliminary result)   Collection Time: 01/24/18  1:37 PM  Result Value Ref Range Status   Specimen Description   Final    URINE, CATHETERIZED Performed at [facility], [address] [place], [place] [address]    Special Requests   Final    NONE Performed at [facility], [address] [place], [place] [address]    Culture >=100,000 COLONIES/mL GRAM NEGATIVE RODS (A)  Final   Report Status PENDING  Incomplete  Rapid strep screen     Status: None   Collection Time: 01/24/18  1:37 PM  Result Value Ref Range Status   Streptococcus, Group A  Screen (Direct) NEGATIVE NEGATIVE Final    Comment: (NOTE) A Rapid Antigen test may result negative if the antigen level in the sample is below the detection level of this test. The FDA has not cleared this test as a stand-alone test therefore the rapid antigen negative result has reflexed to a Group A Strep culture. Performed at [facility], [address], [place], [place] [address]   Culture, group A strep     Status: None (Preliminary result)   Collection Time: 01/24/18  1:37 PM  Result Value Ref Range Status   Specimen Description   Final    THROAT Performed at [facility], [address] [place], [place] [address]    Special Requests   Final    NONE Reflexed from [id number] Performed at [facility], [address], [place], [place] [address]    Culture   Final    TOO YOUNG TO READ Performed at [facility], [place] [address], [place], [place] [address]    Report Status PENDING  Incomplete  Blood Culture ID Panel (Reflexed)     Status: Abnormal   Collection Time: 01/24/18  1:37 PM  Result Value Ref Range Status   Enterococcus species NOT DETECTED NOT DETECTED Final   Listeria monocytogenes NOT DETECTED NOT DETECTED Final  Staphylococcus species NOT DETECTED NOT DETECTED Final   Staphylococcus aureus NOT DETECTED NOT DETECTED Final   Streptococcus species NOT DETECTED NOT DETECTED Final   Streptococcus agalactiae NOT DETECTED NOT DETECTED Final   Streptococcus pneumoniae NOT DETECTED NOT DETECTED Final   Streptococcus pyogenes NOT DETECTED NOT DETECTED Final   Acinetobacter baumannii NOT DETECTED NOT DETECTED Final   Enterobacteriaceae species DETECTED (A) NOT DETECTED Final    Comment: Enterobacteriaceae represent a large family of gram-negative bacteria, not a single organism. CRITICAL RESULT CALLED TO, READ BACK BY AND VERIFIED WITH: L. Seay Pharm.D. 10:05 01/25/18 ([email or web address])    Enterobacter cloacae complex NOT DETECTED NOT DETECTED Final   Escherichia coli DETECTED  (A) NOT DETECTED Final    Comment: CRITICAL RESULT CALLED TO, READ BACK BY AND VERIFIED WITH: L. Seay Pharm.D. 10:05 01/25/18 (wilsonm)    Klebsiella oxytoca NOT DETECTED NOT DETECTED Final   Klebsiella pneumoniae NOT DETECTED NOT DETECTED Final   Proteus species NOT DETECTED NOT DETECTED Final   Serratia marcescens NOT DETECTED NOT DETECTED Final   Carbapenem resistance NOT DETECTED NOT DETECTED Final   Haemophilus influenzae NOT DETECTED NOT DETECTED Final   Neisseria meningitidis NOT DETECTED NOT DETECTED Final   Pseudomonas aeruginosa NOT DETECTED NOT DETECTED Final   Candida albicans NOT DETECTED NOT DETECTED Final   Candida glabrata NOT DETECTED NOT DETECTED Final   Candida krusei NOT DETECTED NOT DETECTED Final   Candida parapsilosis NOT DETECTED NOT DETECTED Final   Candida tropicalis NOT DETECTED NOT DETECTED Final    Comment: Performed at [facility], [place] [address] [place], [place] [address]  Blood Culture (routine x 2)     Status: None (Preliminary result)   Collection Time: 01/24/18  1:42 PM  Result Value Ref Range Status   Specimen Description BLOOD LEFT WRIST  Final   Special Requests   Final    BOTTLES DRAWN AEROBIC AND ANAEROBIC Blood Culture adequate volume   Culture   Final    NO GROWTH 2 DAYS Performed at [facility], [address], [place], [place] [address]    Report Status PENDING  Incomplete  MRSA PCR Screening     Status: None   Collection Time: 01/24/18  7:03 PM  Result Value Ref Range Status   MRSA by PCR NEGATIVE NEGATIVE Final    Comment:        The GeneXpert MRSA Assay (FDA approved for NASAL specimens only), is one component of a comprehensive MRSA colonization surveillance program. It is not intended to diagnose MRSA infection nor to guide or monitor treatment for MRSA infections. Performed at [facility], [address], [place], [place] [address]   Respiratory Panel by PCR     Status: None   Collection Time: 01/24/18  7:03 PM   Result Value Ref Range Status   Adenovirus NOT DETECTED NOT DETECTED Final   Coronavirus 229E NOT DETECTED NOT DETECTED Final   Coronavirus HKU1 NOT DETECTED NOT DETECTED Final   Coronavirus NL63 NOT DETECTED NOT DETECTED Final   Coronavirus OC43 NOT DETECTED NOT DETECTED Final   Metapneumovirus NOT DETECTED NOT DETECTED Final   Rhinovirus / Enterovirus NOT DETECTED NOT DETECTED Final   Influenza A NOT DETECTED NOT DETECTED Final   Influenza B NOT DETECTED NOT DETECTED Final   Parainfluenza Virus 1 NOT DETECTED NOT DETECTED Final   Parainfluenza Virus 2 NOT DETECTED NOT DETECTED Final   Parainfluenza Virus 3 NOT DETECTED NOT DETECTED Final   Parainfluenza Virus 4 NOT  DETECTED NOT DETECTED Final   Respiratory Syncytial Virus NOT DETECTED NOT DETECTED Final   Bordetella pertussis NOT DETECTED NOT DETECTED Final   Chlamydophila pneumoniae NOT DETECTED NOT DETECTED Final   Mycoplasma pneumoniae NOT DETECTED NOT DETECTED Final    Comment: Performed at [facility], [place] [address] [place], [place] [address]     Basic Metabolic Panel: Recent Labs    01/25/18 0657 01/26/18 0500  NA 139 140  K 3.4* 3.3*  CL 107 105  CO2 21* 23  GLUCOSE 100* 78  BUN 20 16  CREATININE 0.98 0.82  CALCIUM 8.0* 8.2*   Liver Function Tests: Recent Labs    01/24/18 1337 01/25/18 0657  AST 21 21  ALT 14 15  ALKPHOS 89 78  BILITOT 0.6 0.8  PROT 7.3 5.9*  ALBUMIN 3.4* 2.7*     CBC: Recent Labs    01/24/18 1337 01/25/18 0657 01/26/18 0500  WBC 11.8* 8.9 7.6  NEUTROABS 9.6*  --  5.3  HGB 13.8 12.5 12.7  HCT 44.0 39.4 40.4  MCV 86.4 85.3 86.0  PLT 265 189 229    Dg Chest Port 1 View  Result Date: 01/24/2018 CLINICAL DATA:  Fever EXAM: PORTABLE CHEST 1 VIEW COMPARISON:  September 13, 2017 FINDINGS: There is no edema or consolidation. Heart is upper normal in size with pulmonary vascularity within normal limits. No adenopathy. There is aortic atherosclerosis. There are multiple metallic  pellets throughout the right chest. No evident bone lesions. IMPRESSION: No edema or consolidation. Stable cardiac silhouette. There is aortic atherosclerosis. Multiple metallic pellets on the right, stable. Aortic Atherosclerosis (ICD10-I70.0). Electronically Signed   By: Lowella Grip III M.D.   On: 01/24/2018 14:13      Medications:   Impression:  Principal Problem:   Sepsis ([place]) Active Problems:   UTI (urinary tract infection)   Tachycardia   Pressure injury of skin     Plan: CBC in Am , k dur 20 meq po x 2 days . Continue current antibiotics await sensitivities   Consultants:  ID, Pharmacy    Procedures Echo Pending    Antibiotics: Merrem           Time spent:30 minutes   LOS: 2 days   Amala Petion M   01/26/2018, 12:39 PM

## 2018-01-26 NOTE — Progress Notes (Signed)
  Echocardiogram 2D Echocardiogram has been performed.  Melissa Baird 01/26/2018, 10:20 AM

## 2018-01-27 LAB — CBC WITH DIFFERENTIAL/PLATELET
BASOS ABS: 0 10*3/uL (ref 0.0–0.1)
Basophils Relative: 0 %
Eosinophils Absolute: 0.1 10*3/uL (ref 0.0–0.7)
Eosinophils Relative: 2 %
HCT: 43.9 % (ref 36.0–46.0)
HEMOGLOBIN: 13.7 g/dL (ref 12.0–15.0)
LYMPHS ABS: 1.3 10*3/uL (ref 0.7–4.0)
LYMPHS PCT: 20 %
MCH: 26.8 pg (ref 26.0–34.0)
MCHC: 31.2 g/dL (ref 30.0–36.0)
MCV: 85.9 fL (ref 78.0–100.0)
Monocytes Absolute: 0.7 10*3/uL (ref 0.1–1.0)
Monocytes Relative: 11 %
NEUTROS ABS: 4.5 10*3/uL (ref 1.7–7.7)
NEUTROS PCT: 67 %
PLATELETS: 257 10*3/uL (ref 150–400)
RBC: 5.11 MIL/uL (ref 3.87–5.11)
RDW: 14.8 % (ref 11.5–15.5)
WBC: 6.6 10*3/uL (ref 4.0–10.5)

## 2018-01-27 LAB — BASIC METABOLIC PANEL
ANION GAP: 10 (ref 5–15)
BUN: 19 mg/dL (ref 6–20)
CHLORIDE: 103 mmol/L (ref 101–111)
CO2: 25 mmol/L (ref 22–32)
Calcium: 8.5 mg/dL — ABNORMAL LOW (ref 8.9–10.3)
Creatinine, Ser: 0.8 mg/dL (ref 0.44–1.00)
Glucose, Bld: 86 mg/dL (ref 65–99)
POTASSIUM: 3.9 mmol/L (ref 3.5–5.1)
SODIUM: 138 mmol/L (ref 135–145)

## 2018-01-27 LAB — CULTURE, GROUP A STREP (THRC)

## 2018-01-27 LAB — CULTURE, BLOOD (ROUTINE X 2): SPECIAL REQUESTS: ADEQUATE

## 2018-01-27 NOTE — Progress Notes (Signed)
On merrem for urosepsis .  Melissa Baird BJY:[id number] DOB: 27-Nov-1941 DOA: 01/24/2018 PCP: Lucia Gaskins, MD   Physical Exam: Blood pressure (!) 161/94, pulse (!) 102, temperature 98.7 F (37.1 C), temperature source Oral, resp. rate 18, height 5\' 4"  (1.626 m), weight 65.1 kg (143 lb 8.3 oz), SpO2 98 %.Lungs- clear . Heart Reg Rhythm 1-2 / 6 Systolic M No H T or R.   Investigations:  Recent Results (from the past 240 hour(s))  Blood Culture (routine x 2)     Status: Abnormal   Collection Time: 01/24/18  1:37 PM  Result Value Ref Range Status   Specimen Description   Final    BLOOD RIGHT ARM DRAWN BY RN Performed at [facility], [address] [place], [place] [address]    Special Requests   Final    BOTTLES DRAWN AEROBIC AND ANAEROBIC Blood Culture adequate volume Performed at [facility], [address] [place], [place] [address]    Culture  Setup Time   Final    CORRECTED RESULTS PREVIOUSLY REPORTED AS: GRAM POSITIVE COCCI AEROBIC BOTTLE ONLY CORRECTED RESULTS CALLED TO: Shellee Milo PHARMD, AT 0751 01/25/18 BY D. Rico Sheehan NEGATIVE RODS    Culture ESCHERICHIA COLI (A)  Final   Report Status 01/27/2018 FINAL  Final   Organism ID, Bacteria ESCHERICHIA COLI  Final      Susceptibility   Escherichia coli - MIC*    AMPICILLIN 8 SENSITIVE Sensitive     CEFAZOLIN <=4 SENSITIVE Sensitive     CEFEPIME <=1 SENSITIVE Sensitive     CEFTAZIDIME <=1 SENSITIVE Sensitive     CEFTRIAXONE <=1 SENSITIVE Sensitive     CIPROFLOXACIN <=0.25 SENSITIVE Sensitive     GENTAMICIN <=1 SENSITIVE Sensitive     IMIPENEM <=0.25 SENSITIVE Sensitive     TRIMETH/SULFA >=320 RESISTANT Resistant     AMPICILLIN/SULBACTAM 4 SENSITIVE Sensitive     PIP/TAZO <=4 SENSITIVE Sensitive     Extended ESBL NEGATIVE Sensitive     * ESCHERICHIA COLI  Urine culture     Status: Abnormal   Collection Time: 01/24/18  1:37 PM  Result Value Ref Range Status   Specimen Description   Final    URINE,  CATHETERIZED Performed at [facility], [address], [place],  [address]    Special Requests   Final    NONE Performed at [facility], [address] [place],  [address]    Culture >=100,000 COLONIES/mL ESCHERICHIA COLI (A)  Final   Report Status 01/26/2018 FINAL  Final   Organism ID, Bacteria ESCHERICHIA COLI (A)  Final      Susceptibility   Escherichia coli - MIC*    AMPICILLIN 8 SENSITIVE Sensitive     CEFAZOLIN <=4 SENSITIVE Sensitive     CEFTRIAXONE <=1 SENSITIVE Sensitive     CIPROFLOXACIN <=0.25 SENSITIVE Sensitive     GENTAMICIN <=1 SENSITIVE Sensitive     IMIPENEM <=0.25 SENSITIVE Sensitive     NITROFURANTOIN 32 SENSITIVE Sensitive     TRIMETH/SULFA >=320 RESISTANT Resistant     AMPICILLIN/SULBACTAM 4 SENSITIVE Sensitive     PIP/TAZO <=4 SENSITIVE Sensitive     Extended ESBL NEGATIVE Sensitive     * >=100,000 COLONIES/mL ESCHERICHIA COLI  Rapid strep screen     Status: None   Collection Time: 01/24/18  1:37 PM  Result Value Ref Range Status   Streptococcus, Group A Screen (Direct) NEGATIVE NEGATIVE Final    Comment: (NOTE) A Rapid Antigen test may result negative if the  antigen level in the sample is below the detection level of this test. The FDA has not cleared this test as a stand-alone test therefore the rapid antigen negative result has reflexed to a Group A Strep culture. Performed at [facility], [address] [place], [place] [address]   Culture, group A strep     Status: None (Preliminary result)   Collection Time: 01/24/18  1:37 PM  Result Value Ref Range Status   Specimen Description   Final    THROAT Performed at [facility], [address], [place], [place] [address]    Special Requests   Final    NONE Reflexed from [id number] Performed at [facility], [address], [place], [place] [address]    Culture   Final    CULTURE REINCUBATED FOR BETTER GROWTH Performed at [facility], [place] [address], [place], [place] [address]     Report Status PENDING  Incomplete  Blood Culture ID Panel (Reflexed)     Status: Abnormal   Collection Time: 01/24/18  1:37 PM  Result Value Ref Range Status   Enterococcus species NOT DETECTED NOT DETECTED Final   Listeria monocytogenes NOT DETECTED NOT DETECTED Final   Staphylococcus species NOT DETECTED NOT DETECTED Final   Staphylococcus aureus NOT DETECTED NOT DETECTED Final   Streptococcus species NOT DETECTED NOT DETECTED Final   Streptococcus agalactiae NOT DETECTED NOT DETECTED Final   Streptococcus pneumoniae NOT DETECTED NOT DETECTED Final   Streptococcus pyogenes NOT DETECTED NOT DETECTED Final   Acinetobacter baumannii NOT DETECTED NOT DETECTED Final   Enterobacteriaceae species DETECTED (A) NOT DETECTED Final    Comment: Enterobacteriaceae represent a large family of gram-negative bacteria, not a single organism. CRITICAL RESULT CALLED TO, READ BACK BY AND VERIFIED WITH: L. Seay Pharm.D. 10:05 01/25/18 (wilsonm)    Enterobacter cloacae complex NOT DETECTED NOT DETECTED Final   Escherichia coli DETECTED (A) NOT DETECTED Final    Comment: CRITICAL RESULT CALLED TO, READ BACK BY AND VERIFIED WITH: L. Seay Pharm.D. 10:05 01/25/18 (wilsonm)    Klebsiella oxytoca NOT DETECTED NOT DETECTED Final   Klebsiella pneumoniae NOT DETECTED NOT DETECTED Final   Proteus species NOT DETECTED NOT DETECTED Final   Serratia marcescens NOT DETECTED NOT DETECTED Final   Carbapenem resistance NOT DETECTED NOT DETECTED Final   Haemophilus influenzae NOT DETECTED NOT DETECTED Final   Neisseria meningitidis NOT DETECTED NOT DETECTED Final   Pseudomonas aeruginosa NOT DETECTED NOT DETECTED Final   Candida albicans NOT DETECTED NOT DETECTED Final   Candida glabrata NOT DETECTED NOT DETECTED Final   Candida krusei NOT DETECTED NOT DETECTED Final   Candida parapsilosis NOT DETECTED NOT DETECTED Final   Candida tropicalis NOT DETECTED NOT DETECTED Final    Comment: Performed at [facility], [place] [address], [place], [place] [address]  Blood Culture (routine x 2)     Status: None (Preliminary result)   Collection Time: 01/24/18  1:42 PM  Result Value Ref Range Status   Specimen Description BLOOD LEFT WRIST  Final   Special Requests   Final    BOTTLES DRAWN AEROBIC AND ANAEROBIC Blood Culture adequate volume   Culture   Final    NO GROWTH 3 DAYS Performed at [facility], [address], [place], [place] [address]    Report Status PENDING  Incomplete  MRSA PCR Screening     Status: None   Collection Time: 01/24/18  7:03 PM  Result Value Ref Range Status  MRSA by PCR NEGATIVE NEGATIVE Final    Comment:        The GeneXpert MRSA Assay (FDA approved for NASAL specimens only), is one component of a comprehensive MRSA colonization surveillance program. It is not intended to diagnose MRSA infection nor to guide or monitor treatment for MRSA infections. Performed at [facility], [address], [place], [place] [address]   Respiratory Panel by PCR     Status: None   Collection Time: 01/24/18  7:03 PM  Result Value Ref Range Status   Adenovirus NOT DETECTED NOT DETECTED Final   Coronavirus 229E NOT DETECTED NOT DETECTED Final   Coronavirus HKU1 NOT DETECTED NOT DETECTED Final   Coronavirus NL63 NOT DETECTED NOT DETECTED Final   Coronavirus OC43 NOT DETECTED NOT DETECTED Final   Metapneumovirus NOT DETECTED NOT DETECTED Final   Rhinovirus / Enterovirus NOT DETECTED NOT DETECTED Final   Influenza A NOT DETECTED NOT DETECTED Final   Influenza B NOT DETECTED NOT DETECTED Final   Parainfluenza Virus 1 NOT DETECTED NOT DETECTED Final   Parainfluenza Virus 2 NOT DETECTED NOT DETECTED Final   Parainfluenza Virus 3 NOT DETECTED NOT DETECTED Final   Parainfluenza Virus 4 NOT DETECTED NOT DETECTED Final   Respiratory Syncytial Virus NOT DETECTED NOT DETECTED Final   Bordetella pertussis NOT DETECTED NOT DETECTED Final   Chlamydophila pneumoniae NOT DETECTED NOT DETECTED Final    Mycoplasma pneumoniae NOT DETECTED NOT DETECTED Final    Comment: Performed at [facility], [place] [address] [place], [place] [address]     Basic Metabolic Panel: Recent Labs    01/26/18 0500 01/27/18 0707  NA 140 138  K 3.3* 3.9  CL 105 103  CO2 23 25  GLUCOSE 78 86  BUN 16 19  CREATININE 0.82 0.80  CALCIUM 8.2* 8.5*   Liver Function Tests: Recent Labs    01/24/18 1337 01/25/18 0657  AST 21 21  ALT 14 15  ALKPHOS 89 78  BILITOT 0.6 0.8  PROT 7.3 5.9*  ALBUMIN 3.4* 2.7*     CBC: Recent Labs    01/26/18 0500 01/27/18 0707  WBC 7.6 6.6  NEUTROABS 5.3 4.5  HGB 12.7 13.7  HCT 40.4 43.9  MCV 86.0 85.9  PLT 229 257    No results found.    Medications:   Impression: Principal Problem:   Sepsis ([place]) Active Problems:   UTI (urinary tract infection)   Tachycardia   Pressure injury of skin     Plan:continue Merrem as per Pharmacy protocall    Consultants: Pharmacy    Procedures   Antibiotics:       Time spent:30 min   LOS: 3 days   Venida Tsukamoto M   01/27/2018, 11:50 AM

## 2018-01-27 NOTE — NC FL2 (Incomplete)
[place] LEVEL OF CARE SCREENING TOOL     IDENTIFICATION  Patient Name: Melissa Baird Birthdate: July 29, 1941 Sex: female Admission Date (Current Location): 01/24/2018  [place] and [place] Number:  [place] [facility] [id number] [place] and Address:  [place] [address] [place]      Provider Number: [id number]  Attending Physician Name and Address:  Lucia Gaskins, MD  Relative Name and Phone Number:  Leda Gauze [phone]    Current Level of Care: Hospital Recommended Level of Care: [facility] Prior Approval Number:    Date Approved/Denied:   PASRR Number:    Discharge Plan: Other (Comment)(ALF)    Current Diagnoses: Patient Active Problem List   Diagnosis Date Noted  . Pressure injury of skin 01/25/2018  . Sepsis ([place]) 01/24/2018  . UTI (urinary tract infection) 01/24/2018  . Tachycardia 01/24/2018  . Influenza 12/30/2016  . Hypoxia 12/30/2016  . Loss of consciousness ([place]) 10/12/2016  . Chest pain 02/23/2016  . Dementia   . Unstable angina ([place]) 06/17/2013  . HLD (hyperlipidemia) 06/17/2013  . TOBACCO ABUSE 09/30/2010  . 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 [place] DISEASE, LUMBAR 09/14/2010  . Back pain, chronic 09/14/2010  . Depression 09/03/2010  . Essential hypertension 09/03/2010  . SYNCOPE 09/03/2010    Orientation RESPIRATION BLADDER Height & Weight     Self  Normal Incontinent Weight: 143 lb 8.3 oz (65.1 kg) Height:  5\' 4"  (162.6 cm)  BEHAVIORAL SYMPTOMS/MOOD NEUROLOGICAL BOWEL NUTRITION STATUS      Continent Diet(heart healthy)  AMBULATORY STATUS COMMUNICATION OF NEEDS Skin   Supervision Verbally PU Stage and Appropriate Care                       Personal Care Assistance Level of Assistance  Bathing, Feeding, Dressing Bathing Assistance: Limited assistance Feeding assistance: Limited assistance Dressing Assistance: Limited assistance     Functional  Limitations Info  Sight, Speech, Hearing Sight Info: Adequate Hearing Info: Adequate Speech Info: Adequate    SPECIAL CARE FACTORS FREQUENCY                       Contractures Contractures Info: Not present    Additional Factors Info  Code Status, Psychotropic Code Status Info: DNR   Psychotropic Info: Wellbutrin, Seroquel         Current Medications (01/27/2018):  This is the current hospital active medication list Current Facility-Administered Medications  Medication Dose Route Frequency Provider Last Rate Last Dose  . acetaminophen (TYLENOL) tablet 650 mg  650 mg Oral Q6H PRN Jani Gravel, MD   650 mg at 01/25/18 9702   Or  . acetaminophen (TYLENOL) suppository 650 mg  650 mg Rectal Q6H PRN Jani Gravel, MD      . aspirin chewable tablet 81 mg  81 mg Oral Daily Jani Gravel, MD   81 mg at 01/27/18 0902  . buPROPion (WELLBUTRIN XL) 24 hr tablet 300 mg  300 mg Oral Daily Jani Gravel, MD   300 mg at 01/27/18 0902  . cholecalciferol (VITAMIN D) tablet 5,000 Units  5,000 Units Oral Daily Jani Gravel, MD   5,000 Units at 01/27/18 0902  . docusate sodium (COLACE) capsule 100 mg  100 mg Oral Daily Jani Gravel, MD   100 mg at 01/27/18 0902  . donepezil (ARICEPT) tablet 10 mg  10 mg Oral Daily Jani Gravel, MD   10 mg at 01/27/18 0902  . enoxaparin (  LOVENOX) injection 40 mg  40 mg Subcutaneous Q24H Lucia Gaskins, MD   40 mg at 01/26/18 2151  . feeding supplement (ENSURE ENLIVE) (ENSURE ENLIVE) liquid 237 mL  237 mL Oral BID BM Jani Gravel, MD   237 mL at 01/27/18 0903  . isosorbide mononitrate (IMDUR) 24 hr tablet 90 mg  90 mg Oral Daily Jani Gravel, MD   90 mg at 01/27/18 0902  . lisinopril (PRINIVIL,ZESTRIL) tablet 20 mg  20 mg Oral Daily Jani Gravel, MD   20 mg at 01/27/18 0902  . memantine (NAMENDA) tablet 10 mg  10 mg Oral BID Jani Gravel, MD   10 mg at 01/27/18 0902  . meropenem (MERREM) 1 g in sodium chloride 0.9 % 100 mL IVPB  1 g Intravenous Q8H Lucia Gaskins, MD 200 mL/hr at  01/27/18 0615 1 g at 01/27/18 0615  . pantoprazole (PROTONIX) EC tablet 40 mg  40 mg Oral Daily Jani Gravel, MD   40 mg at 01/27/18 0902  . pravastatin (PRAVACHOL) tablet 40 mg  40 mg Oral QPM Jani Gravel, MD   40 mg at 01/26/18 1742  . QUEtiapine (SEROQUEL) tablet 100 mg  100 mg Oral QHS Jani Gravel, MD   100 mg at 01/26/18 2151     Discharge Medications: Please see discharge summary for a list of discharge medications.  Relevant Imaging Results:  Relevant Lab Results:   Additional [place] Derenda Giddings, LCSWA

## 2018-01-28 LAB — BASIC METABOLIC PANEL
ANION GAP: 10 (ref 5–15)
BUN: 17 mg/dL (ref 6–20)
CHLORIDE: 105 mmol/L (ref 101–111)
CO2: 24 mmol/L (ref 22–32)
Calcium: 8.7 mg/dL — ABNORMAL LOW (ref 8.9–10.3)
Creatinine, Ser: 0.79 mg/dL (ref 0.44–1.00)
GFR calc non Af Amer: >60 mL/min (ref >60)
Glucose, Bld: 91 mg/dL (ref 65–99)
POTASSIUM: 4.3 mmol/L (ref 3.5–5.1)
Sodium: 139 mmol/L (ref 135–145)

## 2018-01-28 LAB — CBC WITH DIFFERENTIAL/PLATELET
BASOS PCT: 0 %
Basophils Absolute: 0 10*3/uL (ref 0.0–0.1)
Eosinophils Absolute: 0.1 10*3/uL (ref 0.0–0.7)
Eosinophils Relative: 2 %
HEMATOCRIT: 46.7 % — AB (ref 36.0–46.0)
HEMOGLOBIN: 14.7 g/dL (ref 12.0–15.0)
LYMPHS ABS: 1.6 10*3/uL (ref 0.7–4.0)
LYMPHS PCT: 23 %
MCH: 26.8 pg (ref 26.0–34.0)
MCHC: 31.5 g/dL (ref 30.0–36.0)
MCV: 85.2 fL (ref 78.0–100.0)
MONOS PCT: 10 %
Monocytes Absolute: 0.7 10*3/uL (ref 0.1–1.0)
NEUTROS ABS: 4.4 10*3/uL (ref 1.7–7.7)
Neutrophils Relative %: 65 %
Platelets: 224 10*3/uL (ref 150–400)
RBC: 5.48 MIL/uL — ABNORMAL HIGH (ref 3.87–5.11)
RDW: 14.7 % (ref 11.5–15.5)
WBC: 6.8 10*3/uL (ref 4.0–10.5)

## 2018-01-28 NOTE — Progress Notes (Signed)
Melissa Baird urosepsis on meripenem . + cultures urine and blood defervescing clinically , Leukocytosis improved  Melissa Baird ZOX:[id number] DOB: 1941-07-01 DOA: 01/24/2018 PCP: Melissa Gaskins, MD   Physical Exam: Blood pressure (!) 136/93, pulse 100, temperature (!) 97.5 F (36.4 C), temperature source Oral, resp. rate 17, height 5\' 4"  (1.626 Baird), weight 64.1 kg (141 lb 5 oz), SpO2 99 %.Lungs - clear no rales ,, wheeeze or ronchi . Heart Reg Rhythm no S3 S4 No H T R .   Investigations:  Recent Results (from the past 240 hour(s))  Blood Culture (routine x 2)     Status: Abnormal   Collection Time: 01/24/18  1:37 PM  Result Value Ref Range Status   Specimen Description   Final    BLOOD RIGHT ARM DRAWN BY RN Performed at [facility], [address], [place], [place] [address]    Special Requests   Final    BOTTLES DRAWN AEROBIC AND ANAEROBIC Blood Culture adequate volume Performed at [facility], [address] [place], [place] [address]    Culture  Setup Time   Final    CORRECTED RESULTS PREVIOUSLY REPORTED AS: GRAM POSITIVE COCCI AEROBIC BOTTLE ONLY CORRECTED RESULTS CALLED TO: Melissa Baird PHARMD, AT 0751 01/25/18 BY Melissa Baird NEGATIVE RODS    Culture ESCHERICHIA Baird (A)  Final   Report Status 01/27/2018 FINAL  Final   Organism ID, Bacteria ESCHERICHIA Baird  Final      Susceptibility   Escherichia Baird - MIC*    AMPICILLIN 8 SENSITIVE Sensitive     CEFAZOLIN <=4 SENSITIVE Sensitive     CEFEPIME <=1 SENSITIVE Sensitive     CEFTAZIDIME <=1 SENSITIVE Sensitive     CEFTRIAXONE <=1 SENSITIVE Sensitive     CIPROFLOXACIN <=0.25 SENSITIVE Sensitive     GENTAMICIN <=1 SENSITIVE Sensitive     IMIPENEM <=0.25 SENSITIVE Sensitive     TRIMETH/SULFA >=320 RESISTANT Resistant     AMPICILLIN/SULBACTAM 4 SENSITIVE Sensitive     PIP/TAZO <=4 SENSITIVE Sensitive     Extended ESBL NEGATIVE Sensitive     * ESCHERICHIA Baird  Urine culture     Status: Abnormal   Collection Time:  01/24/18  1:37 PM  Result Value Ref Range Status   Specimen Description   Final    URINE, CATHETERIZED Performed at [facility], [address] [place], [place] [address]    Special Requests   Final    NONE Performed at [facility], [address] [place],  [address]    Culture >=100,000 COLONIES/mL ESCHERICHIA Baird (A)  Final   Report Status 01/26/2018 FINAL  Final   Organism ID, Bacteria ESCHERICHIA Baird (A)  Final      Susceptibility   Escherichia Baird - MIC*    AMPICILLIN 8 SENSITIVE Sensitive     CEFAZOLIN <=4 SENSITIVE Sensitive     CEFTRIAXONE <=1 SENSITIVE Sensitive     CIPROFLOXACIN <=0.25 SENSITIVE Sensitive     GENTAMICIN <=1 SENSITIVE Sensitive     IMIPENEM <=0.25 SENSITIVE Sensitive     NITROFURANTOIN 32 SENSITIVE Sensitive     TRIMETH/SULFA >=320 RESISTANT Resistant     AMPICILLIN/SULBACTAM 4 SENSITIVE Sensitive     PIP/TAZO <=4 SENSITIVE Sensitive     Extended ESBL NEGATIVE Sensitive     * >=100,000 COLONIES/mL ESCHERICHIA Baird  Rapid strep screen     Status: None   Collection Time: 01/24/18  1:37 PM  Result Value Ref Range Status   Streptococcus, Group A Screen (Direct) NEGATIVE  NEGATIVE Final    Comment: (NOTE) A Rapid Antigen test may result negative if the antigen level in the sample is below the detection level of this test. The FDA has not cleared this test as a stand-alone test therefore the rapid antigen negative result has reflexed to a Group A Strep culture. Performed at [facility], [address], [place], [place] [address]   Culture, group A strep     Status: None   Collection Time: 01/24/18  1:37 PM  Result Value Ref Range Status   Specimen Description   Final    THROAT Performed at [facility], [address], [place], [place] [address]    Special Requests   Final    NONE Reflexed from [id number] Performed at [facility], [address] [place], [place] [address]    Culture   Final    NO GROUP A STREP (S.PYOGENES)  ISOLATED Performed at [facility], [place] [address], [place], [place] [address]    Report Status 01/27/2018 FINAL  Final  Blood Culture ID Panel (Reflexed)     Status: Abnormal   Collection Time: 01/24/18  1:37 PM  Result Value Ref Range Status   Enterococcus species NOT DETECTED NOT DETECTED Final   Listeria monocytogenes NOT DETECTED NOT DETECTED Final   Staphylococcus species NOT DETECTED NOT DETECTED Final   Staphylococcus aureus NOT DETECTED NOT DETECTED Final   Streptococcus species NOT DETECTED NOT DETECTED Final   Streptococcus agalactiae NOT DETECTED NOT DETECTED Final   Streptococcus pneumoniae NOT DETECTED NOT DETECTED Final   Streptococcus pyogenes NOT DETECTED NOT DETECTED Final   Acinetobacter baumannii NOT DETECTED NOT DETECTED Final   Enterobacteriaceae species DETECTED (A) NOT DETECTED Final    Comment: Enterobacteriaceae represent a large family of gram-negative bacteria, not a single organism. CRITICAL RESULT CALLED TO, READ BACK BY AND VERIFIED WITH: Melissa Baird Pharm.D. 10:05 01/25/18 ([email or web address])    Enterobacter cloacae complex NOT DETECTED NOT DETECTED Final   Escherichia Baird DETECTED (A) NOT DETECTED Final    Comment: CRITICAL RESULT CALLED TO, READ BACK BY AND VERIFIED WITH: Melissa Baird Pharm.D. 10:05 01/25/18 (wilsonm)    Klebsiella oxytoca NOT DETECTED NOT DETECTED Final   Klebsiella pneumoniae NOT DETECTED NOT DETECTED Final   Proteus species NOT DETECTED NOT DETECTED Final   Serratia marcescens NOT DETECTED NOT DETECTED Final   Carbapenem resistance NOT DETECTED NOT DETECTED Final   Haemophilus influenzae NOT DETECTED NOT DETECTED Final   Neisseria meningitidis NOT DETECTED NOT DETECTED Final   Pseudomonas aeruginosa NOT DETECTED NOT DETECTED Final   Candida albicans NOT DETECTED NOT DETECTED Final   Candida glabrata NOT DETECTED NOT DETECTED Final   Candida krusei NOT DETECTED NOT DETECTED Final   Candida parapsilosis NOT DETECTED NOT DETECTED Final    Candida tropicalis NOT DETECTED NOT DETECTED Final    Comment: Performed at [facility], [place] [address], [place], [place] [address]  Blood Culture (routine x 2)     Status: None (Preliminary result)   Collection Time: 01/24/18  1:42 PM  Result Value Ref Range Status   Specimen Description BLOOD LEFT WRIST  Final   Special Requests   Final    BOTTLES DRAWN AEROBIC AND ANAEROBIC Blood Culture adequate volume   Culture   Final    NO GROWTH 4 DAYS Performed at [facility], [address] [place], [place] [address]    Report Status PENDING  Incomplete  MRSA PCR Screening     Status: None  Collection Time: 01/24/18  7:03 PM  Result Value Ref Range Status   MRSA by PCR NEGATIVE NEGATIVE Final    Comment:        The GeneXpert MRSA Assay (FDA approved for NASAL specimens only), is one component of a comprehensive MRSA colonization surveillance program. It is not intended to diagnose MRSA infection nor to guide or monitor treatment for MRSA infections. Performed at [facility], [address] [place], [place] [address]   Respiratory Panel by PCR     Status: None   Collection Time: 01/24/18  7:03 PM  Result Value Ref Range Status   Adenovirus NOT DETECTED NOT DETECTED Final   Coronavirus 229E NOT DETECTED NOT DETECTED Final   Coronavirus HKU1 NOT DETECTED NOT DETECTED Final   Coronavirus NL63 NOT DETECTED NOT DETECTED Final   Coronavirus OC43 NOT DETECTED NOT DETECTED Final   Metapneumovirus NOT DETECTED NOT DETECTED Final   Rhinovirus / Enterovirus NOT DETECTED NOT DETECTED Final   Influenza A NOT DETECTED NOT DETECTED Final   Influenza B NOT DETECTED NOT DETECTED Final   Parainfluenza Virus 1 NOT DETECTED NOT DETECTED Final   Parainfluenza Virus 2 NOT DETECTED NOT DETECTED Final   Parainfluenza Virus 3 NOT DETECTED NOT DETECTED Final   Parainfluenza Virus 4 NOT DETECTED NOT DETECTED Final   Respiratory Syncytial Virus NOT DETECTED NOT DETECTED Final   Bordetella  pertussis NOT DETECTED NOT DETECTED Final   Chlamydophila pneumoniae NOT DETECTED NOT DETECTED Final   Mycoplasma pneumoniae NOT DETECTED NOT DETECTED Final    Comment: Performed at [facility], [place] [address] [place], [place] [address]     Basic Metabolic Panel: Recent Labs    01/27/18 0707 01/28/18 0657  NA 138 139  K 3.9 4.3  CL 103 105  CO2 25 24  GLUCOSE 86 91  BUN 19 17  CREATININE 0.80 0.79  CALCIUM 8.5* 8.7*   Liver Function Tests: No results for input(s): AST, ALT, ALKPHOS, BILITOT, PROT, ALBUMIN in the last 72 hours.   CBC: Recent Labs    01/27/18 0707 01/28/18 0657  WBC 6.6 6.8  NEUTROABS 4.5 4.4  HGB 13.7 14.7  HCT 43.9 46.7*  MCV 85.9 85.2  PLT 257 224    No results found.    Medications:   Impression:  Principal Problem:   Sepsis ([place]) Active Problems:   UTI (urinary tract infection)   Tachycardia   Pressure injury of skin     Plan:continue meripenem IV Q 8 H   Consultants: ID , Pharmacy    Procedures   Antibiotics: Merrem IV Q 8 H        Time spent:30 miin   LOS: 4 days   Melissa Baird   01/28/2018, 11:54 AM

## 2018-01-28 NOTE — Progress Notes (Signed)
Pharmacy Antibiotic Note  Melissa Baird is a 77 y.o. female admitted on 01/24/2018 with bacteremia.  Pharmacy has been consulted for meropenem dosing.  Plan: Merrem 1 gm IV q8 hours F/U cxs and clinical progress Monitor V/S , labs. Deescalate as indicated   Height: 5\' 4"  (162.6 cm) Weight: 141 lb 5 oz (64.1 kg) IBW/kg (Calculated) : 54.7  Temp (24hrs), Avg:97.7 F (36.5 C), Min:97.5 F (36.4 C), Max:98 F (36.7 C)  Recent Labs  Lab 01/24/18 1337 01/24/18 1355 01/24/18 1626 01/25/18 0657 01/26/18 0500 01/27/18 0707 01/28/18 0657  WBC 11.8*  --   --  8.9 7.6 6.6 6.8  CREATININE 1.19*  --   --  0.98 0.82 0.80 0.79  LATICACIDVEN  --  2.06* 1.4  --   --   --   --     Estimated Creatinine Clearance: 52.5 mL/min (by C-G formula based on SCr of 0.79 mg/dL).    No Known Allergies  Antimicrobials this admission: Merrem 2/14>> Vancomycin 2/13>>2/14 Zosyn 2/13>> 2/14  Dose adjustments this admission:  N/A  Microbiology results: 2/13 BCx:  +E.Coli in 1 of 2 bottles 2/13 UCx: E Coli 2/13 MRSA PCR is negative  Thank you for allowing pharmacy to be a part of this patient's care.  Ramond Craver 01/28/2018 2:25 PM

## 2018-01-29 LAB — CULTURE, BLOOD (ROUTINE X 2)
Culture: NO GROWTH
Special Requests: ADEQUATE

## 2018-01-29 LAB — BASIC METABOLIC PANEL
Anion gap: 13 (ref 5–15)
BUN: 20 mg/dL (ref 6–20)
CALCIUM: 8.7 mg/dL — AB (ref 8.9–10.3)
CO2: 23 mmol/L (ref 22–32)
CREATININE: 0.74 mg/dL (ref 0.44–1.00)
Chloride: 103 mmol/L (ref 101–111)
Glucose, Bld: 90 mg/dL (ref 65–99)
Potassium: 4.3 mmol/L (ref 3.5–5.1)
SODIUM: 139 mmol/L (ref 135–145)

## 2018-01-29 MED ORDER — SODIUM CHLORIDE 0.9 % IV SOLN
2.0000 g | INTRAVENOUS | Status: DC
  Administered 2018-01-29 – 2018-01-30 (×2): 2 g via INTRAVENOUS
  Filled 2018-01-29: qty 20
  Filled 2018-01-29 (×2): qty 2
  Filled 2018-01-29: qty 20

## 2018-01-29 NOTE — Progress Notes (Signed)
No evidence of ESBL. E coli urosepsis , Merrem DCed today rocephin started 2 gm Q [address] DEY:[id number] DOB: 1941-09-14 DOA: 01/24/2018 PCP: Lucia Gaskins, MD   Physical Exam: Blood pressure (!) 152/97, pulse (!) 110, temperature 98.5 F (36.9 C), temperature source Oral, resp. rate 17, height 5\' 4"  (1.626 m), weight 68.1 kg (150 lb 2.1 oz), SpO2 96 %.Lungs= No rales, wheeeze or ronchi    Investigations:  Recent Results (from the past 240 hour(s))  Blood Culture (routine x 2)     Status: Abnormal   Collection Time: 01/24/18  1:37 PM  Result Value Ref Range Status   Specimen Description   Final    BLOOD RIGHT ARM DRAWN BY RN Performed at [facility], [address] [place], [place] [address]    Special Requests   Final    BOTTLES DRAWN AEROBIC AND ANAEROBIC Blood Culture adequate volume Performed at [facility], [address], [place], [place] [address]    Culture  Setup Time   Final    CORRECTED RESULTS PREVIOUSLY REPORTED AS: GRAM POSITIVE COCCI AEROBIC BOTTLE ONLY CORRECTED RESULTS CALLED TO: Shellee Milo PHARMD, AT 0751 01/25/18 BY D. Rico Sheehan NEGATIVE RODS    Culture ESCHERICHIA COLI (A)  Final   Report Status 01/27/2018 FINAL  Final   Organism ID, Bacteria ESCHERICHIA COLI  Final      Susceptibility   Escherichia coli - MIC*    AMPICILLIN 8 SENSITIVE Sensitive     CEFAZOLIN <=4 SENSITIVE Sensitive     CEFEPIME <=1 SENSITIVE Sensitive     CEFTAZIDIME <=1 SENSITIVE Sensitive     CEFTRIAXONE <=1 SENSITIVE Sensitive     CIPROFLOXACIN <=0.25 SENSITIVE Sensitive     GENTAMICIN <=1 SENSITIVE Sensitive     IMIPENEM <=0.25 SENSITIVE Sensitive     TRIMETH/SULFA >=320 RESISTANT Resistant     AMPICILLIN/SULBACTAM 4 SENSITIVE Sensitive     PIP/TAZO <=4 SENSITIVE Sensitive     Extended ESBL NEGATIVE Sensitive     * ESCHERICHIA COLI  Urine culture     Status: Abnormal   Collection Time: 01/24/18  1:37 PM  Result Value Ref Range Status   Specimen  Description   Final    URINE, CATHETERIZED Performed at [facility], [address], [place], [place] [address]    Special Requests   Final    NONE Performed at [facility], [address], [place],  [address]    Culture >=100,000 COLONIES/mL ESCHERICHIA COLI (A)  Final   Report Status 01/26/2018 FINAL  Final   Organism ID, Bacteria ESCHERICHIA COLI (A)  Final      Susceptibility   Escherichia coli - MIC*    AMPICILLIN 8 SENSITIVE Sensitive     CEFAZOLIN <=4 SENSITIVE Sensitive     CEFTRIAXONE <=1 SENSITIVE Sensitive     CIPROFLOXACIN <=0.25 SENSITIVE Sensitive     GENTAMICIN <=1 SENSITIVE Sensitive     IMIPENEM <=0.25 SENSITIVE Sensitive     NITROFURANTOIN 32 SENSITIVE Sensitive     TRIMETH/SULFA >=320 RESISTANT Resistant     AMPICILLIN/SULBACTAM 4 SENSITIVE Sensitive     PIP/TAZO <=4 SENSITIVE Sensitive     Extended ESBL NEGATIVE Sensitive     * >=100,000 COLONIES/mL ESCHERICHIA COLI  Rapid strep screen     Status: None   Collection Time: 01/24/18  1:37 PM  Result Value Ref Range Status   Streptococcus, Group A Screen (Direct) NEGATIVE NEGATIVE Final    Comment: (NOTE) A Rapid Antigen test may  result negative if the antigen level in the sample is below the detection level of this test. The FDA has not cleared this test as a stand-alone test therefore the rapid antigen negative result has reflexed to a Group A Strep culture. Performed at [facility], [address], [place], [place] [address]   Culture, group A strep     Status: None   Collection Time: 01/24/18  1:37 PM  Result Value Ref Range Status   Specimen Description   Final    THROAT Performed at [facility], [address] [place], [place] [address]    Special Requests   Final    NONE Reflexed from [id number] Performed at [facility], [address] [place], [place] [address]    Culture   Final    NO GROUP A STREP (S.PYOGENES) ISOLATED Performed at [facility], [place] [address], [place], [place]  [address]    Report Status 01/27/2018 FINAL  Final  Blood Culture ID Panel (Reflexed)     Status: Abnormal   Collection Time: 01/24/18  1:37 PM  Result Value Ref Range Status   Enterococcus species NOT DETECTED NOT DETECTED Final   Listeria monocytogenes NOT DETECTED NOT DETECTED Final   Staphylococcus species NOT DETECTED NOT DETECTED Final   Staphylococcus aureus NOT DETECTED NOT DETECTED Final   Streptococcus species NOT DETECTED NOT DETECTED Final   Streptococcus agalactiae NOT DETECTED NOT DETECTED Final   Streptococcus pneumoniae NOT DETECTED NOT DETECTED Final   Streptococcus pyogenes NOT DETECTED NOT DETECTED Final   Acinetobacter baumannii NOT DETECTED NOT DETECTED Final   Enterobacteriaceae species DETECTED (A) NOT DETECTED Final    Comment: Enterobacteriaceae represent a large family of gram-negative bacteria, not a single organism. CRITICAL RESULT CALLED TO, READ BACK BY AND VERIFIED WITH: L. Seay Pharm.D. 10:05 01/25/18 (wilsonm)    Enterobacter cloacae complex NOT DETECTED NOT DETECTED Final   Escherichia coli DETECTED (A) NOT DETECTED Final    Comment: CRITICAL RESULT CALLED TO, READ BACK BY AND VERIFIED WITH: L. Seay Pharm.D. 10:05 01/25/18 (wilsonm)    Klebsiella oxytoca NOT DETECTED NOT DETECTED Final   Klebsiella pneumoniae NOT DETECTED NOT DETECTED Final   Proteus species NOT DETECTED NOT DETECTED Final   Serratia marcescens NOT DETECTED NOT DETECTED Final   Carbapenem resistance NOT DETECTED NOT DETECTED Final   Haemophilus influenzae NOT DETECTED NOT DETECTED Final   Neisseria meningitidis NOT DETECTED NOT DETECTED Final   Pseudomonas aeruginosa NOT DETECTED NOT DETECTED Final   Candida albicans NOT DETECTED NOT DETECTED Final   Candida glabrata NOT DETECTED NOT DETECTED Final   Candida krusei NOT DETECTED NOT DETECTED Final   Candida parapsilosis NOT DETECTED NOT DETECTED Final   Candida tropicalis NOT DETECTED NOT DETECTED Final    Comment: Performed at [facility], [place] [address], [place], [place] [address]  Blood Culture (routine x 2)     Status: None   Collection Time: 01/24/18  1:42 PM  Result Value Ref Range Status   Specimen Description BLOOD LEFT WRIST  Final   Special Requests   Final    BOTTLES DRAWN AEROBIC AND ANAEROBIC Blood Culture adequate volume   Culture   Final    NO GROWTH 5 DAYS Performed at [facility], [address] [place], [place] [address]    Report Status 01/29/2018 FINAL  Final  MRSA PCR Screening     Status: None   Collection Time: 01/24/18  7:03 PM  Result Value Ref Range  Status   MRSA by PCR NEGATIVE NEGATIVE Final    Comment:        The GeneXpert MRSA Assay (FDA approved for NASAL specimens only), is one component of a comprehensive MRSA colonization surveillance program. It is not intended to diagnose MRSA infection nor to guide or monitor treatment for MRSA infections. Performed at [facility], [address], [place], [place] [address]   Respiratory Panel by PCR     Status: None   Collection Time: 01/24/18  7:03 PM  Result Value Ref Range Status   Adenovirus NOT DETECTED NOT DETECTED Final   Coronavirus 229E NOT DETECTED NOT DETECTED Final   Coronavirus HKU1 NOT DETECTED NOT DETECTED Final   Coronavirus NL63 NOT DETECTED NOT DETECTED Final   Coronavirus OC43 NOT DETECTED NOT DETECTED Final   Metapneumovirus NOT DETECTED NOT DETECTED Final   Rhinovirus / Enterovirus NOT DETECTED NOT DETECTED Final   Influenza A NOT DETECTED NOT DETECTED Final   Influenza B NOT DETECTED NOT DETECTED Final   Parainfluenza Virus 1 NOT DETECTED NOT DETECTED Final   Parainfluenza Virus 2 NOT DETECTED NOT DETECTED Final   Parainfluenza Virus 3 NOT DETECTED NOT DETECTED Final   Parainfluenza Virus 4 NOT DETECTED NOT DETECTED Final   Respiratory Syncytial Virus NOT DETECTED NOT DETECTED Final   Bordetella pertussis NOT DETECTED NOT DETECTED Final   Chlamydophila pneumoniae NOT DETECTED NOT DETECTED Final    Mycoplasma pneumoniae NOT DETECTED NOT DETECTED Final    Comment: Performed at [facility], [place] [address], [place], [place] [address]     Basic Metabolic Panel: Recent Labs    01/28/18 0657 01/29/18 0629  NA 139 139  K 4.3 4.3  CL 105 103  CO2 24 23  GLUCOSE 91 90  BUN 17 20  CREATININE 0.79 0.74  CALCIUM 8.7* 8.7*   Liver Function Tests: No results for input(s): AST, ALT, ALKPHOS, BILITOT, PROT, ALBUMIN in the last 72 hours.   CBC: Recent Labs    01/27/18 0707 01/28/18 0657  WBC 6.6 6.8  NEUTROABS 4.5 4.4  HGB 13.7 14.7  HCT 43.9 46.7*  MCV 85.9 85.2  PLT 257 224    No results found.    Medications:   Impression:  Principal Problem:   Sepsis ([place]) Active Problems:   UTI (urinary tract infection)   Tachycardia   Pressure injury of skin     Plan:Rocephin 2 gm Q 24 H   Consultants: Pharmacy , ID     Procedures   Antibiotics:            Time spent:30 miin   LOS: 5 days   Danelia Snodgrass M   01/29/2018, 1:56 PM

## 2018-01-29 NOTE — Progress Notes (Signed)
Pt has developed scattered rash to bilateral buttocks, back and abdomen. Daughter is concerned that patient may be having a reaction to IV Rocephin. Pt denies itchiness. Dr. Cindie Laroche made aware via telephone. Orders received to continue to monitor. Will update daughter and patient.

## 2018-01-29 NOTE — Plan of Care (Signed)
  Clinical Measurements: Ability to maintain clinical measurements within normal limits will improve 01/29/2018 2317 - Progressing by Cassandria Anger, RN Will remain free from infection 01/29/2018 2317 - Progressing by Cassandria Anger, RN Diagnostic test results will improve 01/29/2018 2317 - Progressing by Cassandria Anger, RN Respiratory complications will improve 01/29/2018 2317 - Progressing by Cassandria Anger, RN

## 2018-01-30 LAB — BASIC METABOLIC PANEL WITH GFR
Anion gap: 9 (ref 5–15)
BUN: 32 mg/dL — ABNORMAL HIGH (ref 6–20)
CO2: 27 mmol/L (ref 22–32)
Calcium: 9.1 mg/dL (ref 8.9–10.3)
Chloride: 102 mmol/L (ref 101–111)
Creatinine, Ser: 0.85 mg/dL (ref 0.44–1.00)
GFR calc Af Amer: >60 mL/min
GFR calc non Af Amer: >60 mL/min
Glucose, Bld: 103 mg/dL — ABNORMAL HIGH (ref 65–99)
Potassium: 3.9 mmol/L (ref 3.5–5.1)
Sodium: 138 mmol/L (ref 135–145)

## 2018-01-30 NOTE — Plan of Care (Signed)
  Health Behavior/Discharge Planning: Ability to manage health-related needs will improve 01/30/2018 2239 - Progressing by Cassandria Anger, RN   Clinical Measurements: Ability to maintain clinical measurements within normal limits will improve 01/30/2018 2239 - Progressing by Cassandria Anger, RN Will remain free from infection 01/30/2018 2239 - Progressing by Cassandria Anger, RN Respiratory complications will improve 01/30/2018 2239 - Progressing by Cassandria Anger, RN   Clinical Measurements: Will remain free from infection 01/30/2018 2239 - Progressing by Cassandria Anger, RN   Clinical Measurements: Respiratory complications will improve 01/30/2018 2239 - Progressing by Cassandria Anger, RN

## 2018-01-30 NOTE — Care Management Note (Signed)
Case Management Note  Patient Details  Name: Melissa Baird MRN: [id number] Date of Birth: [birth year]  If discussed at Long Length of Stay Meetings, dates discussed:  01/30/2018  Additional Comments:  Sherald Barge, RN 01/30/2018, 12:02 PM

## 2018-01-30 NOTE — Progress Notes (Signed)
Rash remains present on abdomen and patient's back. No observed scratching and no verbal complaints of itching.

## 2018-01-30 NOTE — Progress Notes (Signed)
oN ROCEPHIN DAY # 2-3 FOR DISCHARGE IN am wED. AMBULATES WITH ASSISTANCE  Melissa Baird YKD:[id number] DOB: 06/07/1941 DOA: 01/24/2018 PCP: Lucia Gaskins, MD   Physical Exam: Blood pressure 127/80, pulse 100, temperature 98.4 F (36.9 C), temperature source Oral, resp. rate 20, height 5\' 4"  (1.626 m), weight 61.5 kg (135 lb 9.3 oz), SpO2 95 %.lUNGS- NO RALES WHEEEZE IOR RONCHI . HEART - REG RHYTHM NO S3 S4 NO H T OR R.    Investigations:  Recent Results (from the past 240 hour(s))  Blood Culture (routine x 2)     Status: Abnormal   Collection Time: 01/24/18  1:37 PM  Result Value Ref Range Status   Specimen Description   Final    BLOOD RIGHT ARM DRAWN BY RN Performed at [facility], [address] [place], [place] [address]    Special Requests   Final    BOTTLES DRAWN AEROBIC AND ANAEROBIC Blood Culture adequate volume Performed at [facility], [address], [place], [place] [address]    Culture  Setup Time   Final    CORRECTED RESULTS PREVIOUSLY REPORTED AS: GRAM POSITIVE COCCI AEROBIC BOTTLE ONLY CORRECTED RESULTS CALLED TO: Shellee Milo PHARMD, AT 0751 01/25/18 BY D. Rico Sheehan NEGATIVE RODS    Culture ESCHERICHIA COLI (A)  Final   Report Status 01/27/2018 FINAL  Final   Organism ID, Bacteria ESCHERICHIA COLI  Final      Susceptibility   Escherichia coli - MIC*    AMPICILLIN 8 SENSITIVE Sensitive     CEFAZOLIN <=4 SENSITIVE Sensitive     CEFEPIME <=1 SENSITIVE Sensitive     CEFTAZIDIME <=1 SENSITIVE Sensitive     CEFTRIAXONE <=1 SENSITIVE Sensitive     CIPROFLOXACIN <=0.25 SENSITIVE Sensitive     GENTAMICIN <=1 SENSITIVE Sensitive     IMIPENEM <=0.25 SENSITIVE Sensitive     TRIMETH/SULFA >=320 RESISTANT Resistant     AMPICILLIN/SULBACTAM 4 SENSITIVE Sensitive     PIP/TAZO <=4 SENSITIVE Sensitive     Extended ESBL NEGATIVE Sensitive     * ESCHERICHIA COLI  Urine culture     Status: Abnormal   Collection Time: 01/24/18  1:37 PM  Result Value Ref Range Status    Specimen Description   Final    URINE, CATHETERIZED Performed at [facility], [address], [place], [place] [address]    Special Requests   Final    NONE Performed at [facility], [address] [place], [place] [address]    Culture >=100,000 COLONIES/mL ESCHERICHIA COLI (A)  Final   Report Status 01/26/2018 FINAL  Final   Organism ID, Bacteria ESCHERICHIA COLI (A)  Final      Susceptibility   Escherichia coli - MIC*    AMPICILLIN 8 SENSITIVE Sensitive     CEFAZOLIN <=4 SENSITIVE Sensitive     CEFTRIAXONE <=1 SENSITIVE Sensitive     CIPROFLOXACIN <=0.25 SENSITIVE Sensitive     GENTAMICIN <=1 SENSITIVE Sensitive     IMIPENEM <=0.25 SENSITIVE Sensitive     NITROFURANTOIN 32 SENSITIVE Sensitive     TRIMETH/SULFA >=320 RESISTANT Resistant     AMPICILLIN/SULBACTAM 4 SENSITIVE Sensitive     PIP/TAZO <=4 SENSITIVE Sensitive     Extended ESBL NEGATIVE Sensitive     * >=100,000 COLONIES/mL ESCHERICHIA COLI  Rapid strep screen     Status: None   Collection Time: 01/24/18  1:37 PM  Result Value Ref Range Status   Streptococcus, Group A Screen (Direct) NEGATIVE NEGATIVE Final    Comment: (  NOTE) A Rapid Antigen test may result negative if the antigen level in the sample is below the detection level of this test. The FDA has not cleared this test as a stand-alone test therefore the rapid antigen negative result has reflexed to a Group A Strep culture. Performed at [facility], [address] [place], [place] [address]   Culture, group A strep     Status: None   Collection Time: 01/24/18  1:37 PM  Result Value Ref Range Status   Specimen Description   Final    THROAT Performed at [facility], [address], [place], [place] [address]    Special Requests   Final    NONE Reflexed from [id number] Performed at [facility], [address], [place], [place] [address]    Culture   Final    NO GROUP A STREP (S.PYOGENES) ISOLATED Performed at [facility], [place] [address],  [place], [place] [address]    Report Status 01/27/2018 FINAL  Final  Blood Culture ID Panel (Reflexed)     Status: Abnormal   Collection Time: 01/24/18  1:37 PM  Result Value Ref Range Status   Enterococcus species NOT DETECTED NOT DETECTED Final   Listeria monocytogenes NOT DETECTED NOT DETECTED Final   Staphylococcus species NOT DETECTED NOT DETECTED Final   Staphylococcus aureus NOT DETECTED NOT DETECTED Final   Streptococcus species NOT DETECTED NOT DETECTED Final   Streptococcus agalactiae NOT DETECTED NOT DETECTED Final   Streptococcus pneumoniae NOT DETECTED NOT DETECTED Final   Streptococcus pyogenes NOT DETECTED NOT DETECTED Final   Acinetobacter baumannii NOT DETECTED NOT DETECTED Final   Enterobacteriaceae species DETECTED (A) NOT DETECTED Final    Comment: Enterobacteriaceae represent a large family of gram-negative bacteria, not a single organism. CRITICAL RESULT CALLED TO, READ BACK BY AND VERIFIED WITH: L. Seay Pharm.D. 10:05 01/25/18 (wilsonm)    Enterobacter cloacae complex NOT DETECTED NOT DETECTED Final   Escherichia coli DETECTED (A) NOT DETECTED Final    Comment: CRITICAL RESULT CALLED TO, READ BACK BY AND VERIFIED WITH: L. Seay Pharm.D. 10:05 01/25/18 (wilsonm)    Klebsiella oxytoca NOT DETECTED NOT DETECTED Final   Klebsiella pneumoniae NOT DETECTED NOT DETECTED Final   Proteus species NOT DETECTED NOT DETECTED Final   Serratia marcescens NOT DETECTED NOT DETECTED Final   Carbapenem resistance NOT DETECTED NOT DETECTED Final   Haemophilus influenzae NOT DETECTED NOT DETECTED Final   Neisseria meningitidis NOT DETECTED NOT DETECTED Final   Pseudomonas aeruginosa NOT DETECTED NOT DETECTED Final   Candida albicans NOT DETECTED NOT DETECTED Final   Candida glabrata NOT DETECTED NOT DETECTED Final   Candida krusei NOT DETECTED NOT DETECTED Final   Candida parapsilosis NOT DETECTED NOT DETECTED Final   Candida tropicalis NOT DETECTED NOT DETECTED Final    Comment:  Performed at [facility], [place] [address] [place], [place] [address]  Blood Culture (routine x 2)     Status: None   Collection Time: 01/24/18  1:42 PM  Result Value Ref Range Status   Specimen Description BLOOD LEFT WRIST  Final   Special Requests   Final    BOTTLES DRAWN AEROBIC AND ANAEROBIC Blood Culture adequate volume   Culture   Final    NO GROWTH 5 DAYS Performed at [facility], [address], [place], [place] [address]    Report Status 01/29/2018 FINAL  Final  MRSA PCR Screening     Status: None   Collection Time: 01/24/18  7:03  PM  Result Value Ref Range Status   MRSA by PCR NEGATIVE NEGATIVE Final    Comment:        The GeneXpert MRSA Assay (FDA approved for NASAL specimens only), is one component of a comprehensive MRSA colonization surveillance program. It is not intended to diagnose MRSA infection nor to guide or monitor treatment for MRSA infections. Performed at [facility], [address] [place], [place] [address]   Respiratory Panel by PCR     Status: None   Collection Time: 01/24/18  7:03 PM  Result Value Ref Range Status   Adenovirus NOT DETECTED NOT DETECTED Final   Coronavirus 229E NOT DETECTED NOT DETECTED Final   Coronavirus HKU1 NOT DETECTED NOT DETECTED Final   Coronavirus NL63 NOT DETECTED NOT DETECTED Final   Coronavirus OC43 NOT DETECTED NOT DETECTED Final   Metapneumovirus NOT DETECTED NOT DETECTED Final   Rhinovirus / Enterovirus NOT DETECTED NOT DETECTED Final   Influenza A NOT DETECTED NOT DETECTED Final   Influenza B NOT DETECTED NOT DETECTED Final   Parainfluenza Virus 1 NOT DETECTED NOT DETECTED Final   Parainfluenza Virus 2 NOT DETECTED NOT DETECTED Final   Parainfluenza Virus 3 NOT DETECTED NOT DETECTED Final   Parainfluenza Virus 4 NOT DETECTED NOT DETECTED Final   Respiratory Syncytial Virus NOT DETECTED NOT DETECTED Final   Bordetella pertussis NOT DETECTED NOT DETECTED Final   Chlamydophila pneumoniae NOT DETECTED NOT  DETECTED Final   Mycoplasma pneumoniae NOT DETECTED NOT DETECTED Final    Comment: Performed at [facility], [place] [address], [place], [place] [address]     Basic Metabolic Panel: Recent Labs    01/29/18 0629 01/30/18 0511  NA 139 138  K 4.3 3.9  CL 103 102  CO2 23 27  GLUCOSE 90 103*  BUN 20 32*  CREATININE 0.74 0.85  CALCIUM 8.7* 9.1   Liver Function Tests: No results for input(s): AST, ALT, ALKPHOS, BILITOT, PROT, ALBUMIN in the last 72 hours.   CBC: Recent Labs    01/28/18 0657  WBC 6.8  NEUTROABS 4.4  HGB 14.7  HCT 46.7*  MCV 85.2  PLT 224    No results found.    Medications:   Impression:  Principal Problem:   Sepsis ([place]) Active Problems:   UTI (urinary tract infection)   Tachycardia   Pressure injury of skin     Plan:rOCEPHIN d/c IN am    Consultants: PHARMACY    Procedures   Antibiotics       Time spent:30 MIN   LOS: 6 days   Macgregor Aeschliman M   01/30/2018, 1:14 PM

## 2018-01-30 NOTE — Clinical Social Work Note (Signed)
LCSW following. Per RN CM, MD indicated pt will likely be stable for dc back to her ALF tomorrow. Voicemail message left for Tammy at [facility] to update. Anticipating that she will come evaluate pt later today to assess her safety to return to the ALF.  Will follow and assist with dc planning.

## 2018-01-31 ENCOUNTER — Emergency Department (HOSPITAL_COMMUNITY)
Admission: EM | Admit: 2018-01-31 | Discharge: 2018-02-01 | Disposition: A | Discharge status: Home / Self Care | Payer: Medicare Other | Attending: Physician Assistant | Admitting: Physician Assistant

## 2018-01-31 ENCOUNTER — Emergency Department (HOSPITAL_COMMUNITY): Payer: Medicare Other

## 2018-01-31 ENCOUNTER — Encounter (HOSPITAL_COMMUNITY): Payer: Self-pay | Admitting: Cardiology

## 2018-01-31 DIAGNOSIS — N390 Urinary tract infection, site not specified: Secondary | ICD-10-CM | POA: Insufficient documentation

## 2018-01-31 DIAGNOSIS — Z87891 Personal history of nicotine dependence: Secondary | ICD-10-CM | POA: Insufficient documentation

## 2018-01-31 DIAGNOSIS — F0280 Dementia in other diseases classified elsewhere without behavioral disturbance: Secondary | ICD-10-CM | POA: Insufficient documentation

## 2018-01-31 DIAGNOSIS — I2510 Atherosclerotic heart disease of native coronary artery without angina pectoris: Secondary | ICD-10-CM | POA: Insufficient documentation

## 2018-01-31 DIAGNOSIS — J449 Chronic obstructive pulmonary disease, unspecified: Secondary | ICD-10-CM | POA: Insufficient documentation

## 2018-01-31 DIAGNOSIS — G309 Alzheimer's disease, unspecified: Secondary | ICD-10-CM | POA: Insufficient documentation

## 2018-01-31 DIAGNOSIS — I10 Essential (primary) hypertension: Secondary | ICD-10-CM | POA: Insufficient documentation

## 2018-01-31 LAB — COMPREHENSIVE METABOLIC PANEL
ALBUMIN: 3 g/dL — AB (ref 3.5–5.0)
ALK PHOS: 111 U/L (ref 38–126)
ALT: 34 U/L (ref 14–54)
ANION GAP: 13 (ref 5–15)
AST: 38 U/L (ref 15–41)
BILIRUBIN TOTAL: 0.3 mg/dL (ref 0.3–1.2)
BUN: 30 mg/dL — ABNORMAL HIGH (ref 6–20)
CALCIUM: 9.3 mg/dL (ref 8.9–10.3)
CO2: 23 mmol/L (ref 22–32)
Chloride: 102 mmol/L (ref 101–111)
Creatinine, Ser: 1.07 mg/dL — ABNORMAL HIGH (ref 0.44–1.00)
GFR, EST AFRICAN AMERICAN: 57 mL/min — AB (ref >60)
GFR, EST NON AFRICAN AMERICAN: 49 mL/min — AB (ref >60)
Glucose, Bld: 107 mg/dL — ABNORMAL HIGH (ref 65–99)
Potassium: 4.1 mmol/L (ref 3.5–5.1)
Sodium: 138 mmol/L (ref 135–145)
TOTAL PROTEIN: 6.7 g/dL (ref 6.5–8.1)

## 2018-01-31 LAB — CBC WITH DIFFERENTIAL/PLATELET
BASOS PCT: 0 %
Basophils Absolute: 0 10*3/uL (ref 0.0–0.1)
Eosinophils Absolute: 0.2 10*3/uL (ref 0.0–0.7)
Eosinophils Relative: 2 %
HEMATOCRIT: 46.1 % — AB (ref 36.0–46.0)
Hemoglobin: 15 g/dL (ref 12.0–15.0)
LYMPHS ABS: 1.8 10*3/uL (ref 0.7–4.0)
Lymphocytes Relative: 16 %
MCH: 27.4 pg (ref 26.0–34.0)
MCHC: 32.5 g/dL (ref 30.0–36.0)
MCV: 84.1 fL (ref 78.0–100.0)
MONO ABS: 0.9 10*3/uL (ref 0.1–1.0)
MONOS PCT: 8 %
NEUTROS ABS: 8.3 10*3/uL (ref 1.7–7.7)
Neutrophils Relative %: 74 %
Platelets: 267 10*3/uL (ref 150–400)
RBC: 5.48 MIL/uL — ABNORMAL HIGH (ref 3.87–5.11)
RDW: 14.7 % (ref 11.5–15.5)
WBC: 11.3 10*3/uL — ABNORMAL HIGH (ref 4.0–10.5)

## 2018-01-31 LAB — CBG MONITORING, ED: Glucose-Capillary: 133 mg/dL — ABNORMAL HIGH (ref 65–99)

## 2018-01-31 LAB — LACTIC ACID, PLASMA
LACTIC ACID, VENOUS: 3.1 mmol/L — AB (ref 0.5–1.9)
LACTIC ACID, VENOUS: 2 mmol/L — AB (ref 0.5–1.9)

## 2018-01-31 LAB — CREATININE, SERUM
Creatinine, Ser: 0.78 mg/dL (ref 0.44–1.00)
GFR calc non Af Amer: >60 mL/min (ref >60)

## 2018-01-31 LAB — TROPONIN I: Troponin I: <0.03 ng/mL (ref <0.03)

## 2018-01-31 MED ORDER — CIPROFLOXACIN HCL 250 MG PO TABS
500.0000 mg | ORAL_TABLET | Freq: Two times a day (BID) | ORAL | Status: DC
  Administered 2018-01-31: 500 mg via ORAL
  Filled 2018-01-31: qty 2

## 2018-01-31 MED ORDER — SODIUM CHLORIDE 0.9 % IV BOLUS (SEPSIS)
1000.0000 mL | Freq: Once | INTRAVENOUS | Status: AC
  Administered 2018-01-31: 1000 mL via INTRAVENOUS

## 2018-01-31 MED ORDER — CIPROFLOXACIN IN D5W 400 MG/200ML IV SOLN
400.0000 mg | Freq: Once | INTRAVENOUS | Status: AC
  Administered 2018-01-31: 400 mg via INTRAVENOUS
  Filled 2018-01-31: qty 200

## 2018-01-31 MED ORDER — CIPROFLOXACIN HCL 500 MG PO TABS: 500.0000 mg | ORAL_TABLET | Freq: Two times a day (BID) | ORAL | 0 refills | Status: DC

## 2018-01-31 NOTE — ED Notes (Signed)
Date and time results received: 01/31/18 2240 Test: lactic acid Critical Value: 2.0 Name of Provider Notified: Dr.mesner Orders Received? Or Actions Taken?: md notified

## 2018-01-31 NOTE — Care Management Important Message (Signed)
Important Message  Patient Details  Name: Melissa Baird MRN: [id number] Date of Birth: 1941/04/22   Medicare Important Message Given:  Yes    Sherald Barge, RN 01/31/2018, 11:24 AM

## 2018-01-31 NOTE — ED Notes (Signed)
Spoke with [facility].  Per staff.  Pt normally alert and oriented,  Ambulatory.

## 2018-01-31 NOTE — NC FL2 (Deleted)
[place] LEVEL OF CARE SCREENING TOOL     IDENTIFICATION  Patient Name: Melissa Baird Birthdate: Apr 19, 1941 Sex: female Admission Date (Current Location): 01/24/2018  [place] and [place] Number:  [place] [facility] [id number] [place] and Address:  [place] [address] [place]      Provider Number: [id number]  Attending Physician Name and Address:  Lucia Gaskins, MD  Relative Name and Phone Number:  Leda Gauze [phone]    Current Level of Care: Hospital Recommended Level of Care: [place] Prior Approval Number:    Date Approved/Denied:   PASRR Number:    Discharge Plan: Other (Comment)(ALF)    Current Diagnoses: Patient Active Problem List   Diagnosis Date Noted  . Pressure injury of skin 01/25/2018  . Sepsis ([place]) 01/24/2018  . UTI (urinary tract infection) 01/24/2018  . Tachycardia 01/24/2018  . Influenza 12/30/2016  . Hypoxia 12/30/2016  . Loss of consciousness ([place]) 10/12/2016  . Chest pain 02/23/2016  . Dementia   . Unstable angina ([place]) 06/17/2013  . HLD (hyperlipidemia) 06/17/2013  . TOBACCO ABUSE 09/30/2010  . 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 [place] DISEASE, LUMBAR 09/14/2010  . Back pain, chronic 09/14/2010  . Depression 09/03/2010  . Essential hypertension 09/03/2010  . SYNCOPE 09/03/2010    Orientation RESPIRATION BLADDER Height & Weight     Self  Normal Incontinent Weight: 135 lb 2.3 oz (61.3 kg) Height:  5\' 4"  (162.6 cm)  BEHAVIORAL SYMPTOMS/MOOD NEUROLOGICAL BOWEL NUTRITION STATUS      Continent Diet(heart healthy)  AMBULATORY STATUS COMMUNICATION OF NEEDS Skin   Supervision Verbally PU Stage and Appropriate Care                       Personal Care Assistance Level of Assistance  Bathing, Feeding, Dressing Bathing Assistance: Limited assistance Feeding assistance: Limited assistance Dressing Assistance: Limited assistance     Functional  Limitations Info  Sight, Speech, Hearing Sight Info: Adequate Hearing Info: Adequate Speech Info: Adequate    SPECIAL CARE FACTORS FREQUENCY                       Contractures Contractures Info: Not present    Additional Factors Info  Code Status, Psychotropic Code Status Info: DNR   Psychotropic Info: Wellbutrin, Seroquel         Current Medications (01/31/2018):  This is the current hospital active medication list Current Facility-Administered Medications  Medication Dose Route Frequency Provider Last Rate Last Dose  . aspirin chewable tablet 81 mg  81 mg Oral Daily Jani Gravel, MD   81 mg at 01/31/18 9038  . buPROPion (WELLBUTRIN XL) 24 hr tablet 300 mg  300 mg Oral Daily Jani Gravel, MD   300 mg at 01/31/18 3338  . cholecalciferol (VITAMIN D) tablet 5,000 Units  5,000 Units Oral Daily Jani Gravel, MD   5,000 Units at 01/31/18 [phone]  . ciprofloxacin (CIPRO) tablet 500 mg  500 mg Oral BID Lucia Gaskins, MD      . donepezil (ARICEPT) tablet 10 mg  10 mg Oral Daily Jani Gravel, MD   10 mg at 01/31/18 9166  . feeding supplement (ENSURE ENLIVE) (ENSURE ENLIVE) liquid 237 mL  237 mL Oral BID BM Jani Gravel, MD   237 mL at 01/31/18 1002  . isosorbide mononitrate (IMDUR) 24 hr tablet 90 mg  90 mg Oral Daily Jani Gravel, MD   90 mg at 01/31/18 0600  .  lisinopril (PRINIVIL,ZESTRIL) tablet 20 mg  20 mg Oral Daily Jani Gravel, MD   20 mg at 01/31/18 2446  . memantine (NAMENDA) tablet 10 mg  10 mg Oral BID Jani Gravel, MD   10 mg at 01/31/18 9507  . pantoprazole (PROTONIX) EC tablet 40 mg  40 mg Oral Daily Jani Gravel, MD   40 mg at 01/31/18 2257  . pravastatin (PRAVACHOL) tablet 40 mg  40 mg Oral QPM Jani Gravel, MD   40 mg at 01/30/18 1806  . QUEtiapine (SEROQUEL) tablet 100 mg  100 mg Oral QHS Jani Gravel, MD   100 mg at 01/30/18 2131     Discharge Medications: Please see discharge summary for a list of discharge medications.  Relevant Imaging Results:  Relevant Lab  Results:   Additional Information    Shade Flood, LCSW

## 2018-01-31 NOTE — Clinical Social Work Note (Addendum)
LCSW following. Anticipating pt will dc back to [facility] today. Spoke with pt's daughter, Mariann Laster, by phone to update. She is in agreement with the dc plan. Spoke with Baker Janus at [facility] and they are able to transport pt back to the facility once the have the [facility] faxed to them at [phone].  Will send this fax once MD has completed DC orders and signed the [place].  1300: Spoke with Baker Janus at [facility]. They have what they need to accept pt back. RN to call report and then they will come transport. Updated RN, Lilia Pro, who will finalize pt's dc.

## 2018-01-31 NOTE — NC FL2 (Signed)
[place] LEVEL OF CARE SCREENING TOOL     IDENTIFICATION  Patient Name: Melissa Baird Birthdate: 29-Nov-1941 Sex: female Admission Date (Current Location): 01/24/2018  [place] and [place] Number:  [place] [facility] [id number] [place] and Address:  [place] [address] [place]      Provider Number: [id number]  Attending Physician Name and Address:  Lucia Gaskins, MD  Relative Name and Phone Number:  Leda Gauze [phone]    Current Level of Care: Hospital Recommended Level of Care: [place] Prior Approval Number:    Date Approved/Denied:   PASRR Number:    Discharge Plan: Other (Comment)(ALF)    Current Diagnoses: Patient Active Problem List   Diagnosis Date Noted  . Pressure injury of skin 01/25/2018  . Sepsis ([place]) 01/24/2018  . UTI (urinary tract infection) 01/24/2018  . Tachycardia 01/24/2018  . Influenza 12/30/2016  . Hypoxia 12/30/2016  . Loss of consciousness ([place]) 10/12/2016  . Chest pain 02/23/2016  . Dementia   . Unstable angina ([place]) 06/17/2013  . HLD (hyperlipidemia) 06/17/2013  . TOBACCO ABUSE 09/30/2010  . 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 [place] DISEASE, LUMBAR 09/14/2010  . Back pain, chronic 09/14/2010  . Depression 09/03/2010  . Essential hypertension 09/03/2010  . SYNCOPE 09/03/2010    Orientation RESPIRATION BLADDER Height & Weight     Self  Normal Incontinent Weight: 135 lb 2.3 oz (61.3 kg) Height:  5\' 4"  (162.6 cm)  BEHAVIORAL SYMPTOMS/MOOD NEUROLOGICAL BOWEL NUTRITION STATUS      Continent Diet(heart healthy)  AMBULATORY STATUS COMMUNICATION OF NEEDS Skin   Supervision Verbally Other (Comment)(rash)                       Personal Care Assistance Level of Assistance  Bathing, Feeding, Dressing Bathing Assistance: Limited assistance Feeding assistance: Limited assistance Dressing Assistance: Limited assistance     Functional Limitations Info   Sight, Speech, Hearing Sight Info: Adequate Hearing Info: Adequate Speech Info: Adequate    SPECIAL CARE FACTORS FREQUENCY                       Contractures Contractures Info: Not present    Additional Factors Info  Code Status, Psychotropic Code Status Info: DNR   Psychotropic Info: Wellbutrin, Seroquel         Current Medications (01/31/2018):  This is the current hospital active medication list Current Facility-Administered Medications  Medication Dose Route Frequency Provider Last Rate Last Dose  . aspirin chewable tablet 81 mg  81 mg Oral Daily Jani Gravel, MD   81 mg at 01/31/18 9629  . buPROPion (WELLBUTRIN XL) 24 hr tablet 300 mg  300 mg Oral Daily Jani Gravel, MD   300 mg at 01/31/18 5284  . cholecalciferol (VITAMIN D) tablet 5,000 Units  5,000 Units Oral Daily Jani Gravel, MD   5,000 Units at 01/31/18 [phone]  . ciprofloxacin (CIPRO) tablet 500 mg  500 mg Oral BID Lucia Gaskins, MD   500 mg at 01/31/18 1241  . donepezil (ARICEPT) tablet 10 mg  10 mg Oral Daily Jani Gravel, MD   10 mg at 01/31/18 4010  . feeding supplement (ENSURE ENLIVE) (ENSURE ENLIVE) liquid 237 mL  237 mL Oral BID BM Jani Gravel, MD   237 mL at 01/31/18 1002  . isosorbide mononitrate (IMDUR) 24 hr tablet 90 mg  90 mg Oral Daily Jani Gravel, MD   90 mg at 01/31/18 2725  .  lisinopril (PRINIVIL,ZESTRIL) tablet 20 mg  20 mg Oral Daily Jani Gravel, MD   20 mg at 01/31/18 1610  . memantine (NAMENDA) tablet 10 mg  10 mg Oral BID Jani Gravel, MD   10 mg at 01/31/18 9604  . pantoprazole (PROTONIX) EC tablet 40 mg  40 mg Oral Daily Jani Gravel, MD   40 mg at 01/31/18 5409  . pravastatin (PRAVACHOL) tablet 40 mg  40 mg Oral QPM Jani Gravel, MD   40 mg at 01/30/18 1806  . QUEtiapine (SEROQUEL) tablet 100 mg  100 mg Oral QHS Jani Gravel, MD   100 mg at 01/30/18 2131     Discharge Medications:   Medication List     TAKE these medications   acetaminophen 500 MG tablet Commonly known as:  TYLENOL Take 500  mg by mouth every 6 (six) hours as needed for mild pain or moderate pain.   amLODipine 5 MG tablet Commonly known as:  NORVASC Take 1 tablet (5 mg total) by mouth daily.   aspirin 81 MG chewable tablet Chew 1 tablet (81 mg total) by mouth daily.   buPROPion 300 MG 24 hr tablet Commonly known as:  WELLBUTRIN XL Take 300 mg by mouth every morning.   calcium citrate 950 MG tablet Commonly known as:  CALCITRATE - dosed in mg elemental calcium Take 2 tablets by mouth 2 (two) times daily.   ciprofloxacin 500 MG tablet Commonly known as:  CIPRO Take 1 tablet (500 mg total) by mouth 2 (two) times daily.   docusate sodium 100 MG capsule Commonly known as:  COLACE Take 100 mg by mouth daily.   donepezil 10 MG tablet Commonly known as:  ARICEPT Take 10 mg by mouth daily.   FOSAMAX 70 MG tablet Generic drug:  alendronate Take 70 mg by mouth every 7 (seven) days. Take with a full glass of water on an empty stomach.   isosorbide mononitrate 30 MG 24 hr tablet Commonly known as:  IMDUR Take 90 mg by mouth daily.   lisinopril 20 MG tablet Commonly known as:  PRINIVIL,ZESTRIL TAKE 1&1/2 TABLET (30MG ) BY MOUTH ONCE DAILY.   memantine 10 MG tablet Commonly known as:  NAMENDA Take 10 mg by mouth 2 (two) times daily.   nitroGLYCERIN 0.4 MG SL tablet Commonly known as:  NITROSTAT Place 1 tablet (0.4 mg total) under the tongue every 5 (five) minutes as needed for chest pain.   pantoprazole 40 MG tablet Commonly known as:  PROTONIX Take 40 mg by mouth daily.   pravastatin 40 MG tablet Commonly known as:  PRAVACHOL Take 40 mg by mouth every evening.   QUEtiapine 100 MG tablet Commonly known as:  SEROQUEL Take 100 mg by mouth at bedtime.   Vitamin D3 5000 units Caps Take 5,000 Units by mouth daily.      No Known Allergies    Relevant Imaging Results:  Relevant Lab Results:   Additional Information    Shade Flood, LCSW
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
## 2018-01-31 NOTE — ED Notes (Signed)
Date and time results received: 01/31/18 2128 (use smartphrase ".now" to insert current time)  Test: lactic Critical Value: 3.1  Name of Provider Notified: Dr. Dayna Barker notified @ 2128  Orders Received? Or Actions Taken?: no/na
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
## 2018-01-31 NOTE — ED Triage Notes (Signed)
Pt just discharged from hospital 2 hours ago with the flu.  Per staff at [facility].  Pt not acting right since discharge

## 2018-01-31 NOTE — NC FL2 (Deleted)
[place] LEVEL OF CARE SCREENING TOOL     IDENTIFICATION  Patient Name: Melissa Baird Birthdate: 1941-03-12 Sex: female Admission Date (Current Location): 01/24/2018  [place] and [place] Number:  [place] [facility] [id number] [place] and Address:  [facility] [address] [place]      Provider Number: [id number]  Attending Physician Name and Address:  Lucia Gaskins, MD  Relative Name and Phone Number:  Leda Gauze [phone]    Current Level of Care: Hospital Recommended Level of Care: [place] Prior Approval Number:    Date Approved/Denied:   PASRR Number:    Discharge Plan: Other (Comment)(ALF)    Current Diagnoses: Patient Active Problem List   Diagnosis Date Noted  . Pressure injury of skin 01/25/2018  . Sepsis ([place]) 01/24/2018  . UTI (urinary tract infection) 01/24/2018  . Tachycardia 01/24/2018  . Influenza 12/30/2016  . Hypoxia 12/30/2016  . Loss of consciousness ([employer]) 10/12/2016  . Chest pain 02/23/2016  . Dementia   . Unstable angina ([place]) 06/17/2013  . HLD (hyperlipidemia) 06/17/2013  . TOBACCO ABUSE 09/30/2010  . 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 [place] DISEASE, LUMBAR 09/14/2010  . Back pain, chronic 09/14/2010  . Depression 09/03/2010  . Essential hypertension 09/03/2010  . SYNCOPE 09/03/2010    Orientation RESPIRATION BLADDER Height & Weight     Self  Normal Incontinent Weight: 135 lb 2.3 oz (61.3 kg) Height:  5\' 4"  (162.6 cm)  BEHAVIORAL SYMPTOMS/MOOD NEUROLOGICAL BOWEL NUTRITION STATUS      Continent Diet(heart healthy)  AMBULATORY STATUS COMMUNICATION OF NEEDS Skin   Supervision Verbally PU Stage and Appropriate Care                       Personal Care Assistance Level of Assistance  Bathing, Feeding, Dressing Bathing Assistance: Limited assistance Feeding assistance: Limited assistance Dressing Assistance: Limited assistance     Functional  Limitations Info  Sight, Speech, Hearing Sight Info: Adequate Hearing Info: Adequate Speech Info: Adequate    SPECIAL CARE FACTORS FREQUENCY                       Contractures Contractures Info: Not present    Additional Factors Info  Code Status, Psychotropic Code Status Info: DNR   Psychotropic Info: Wellbutrin, Seroquel         Current Medications (01/31/2018):  This is the current hospital active medication list Current Facility-Administered Medications  Medication Dose Route Frequency Provider Last Rate Last Dose  . aspirin chewable tablet 81 mg  81 mg Oral Daily Jani Gravel, MD   81 mg at 01/31/18 7510  . buPROPion (WELLBUTRIN XL) 24 hr tablet 300 mg  300 mg Oral Daily Jani Gravel, MD   300 mg at 01/31/18 2585  . cholecalciferol (VITAMIN D) tablet 5,000 Units  5,000 Units Oral Daily Jani Gravel, MD   5,000 Units at 01/31/18 [phone]  . ciprofloxacin (CIPRO) tablet 500 mg  500 mg Oral BID Lucia Gaskins, MD      . donepezil (ARICEPT) tablet 10 mg  10 mg Oral Daily Jani Gravel, MD   10 mg at 01/31/18 2423  . feeding supplement (ENSURE ENLIVE) (ENSURE ENLIVE) liquid 237 mL  237 mL Oral BID BM Jani Gravel, MD   237 mL at 01/31/18 1002  . isosorbide mononitrate (IMDUR) 24 hr tablet 90 mg  90 mg Oral Daily Jani Gravel, MD   90 mg at 01/31/18 5361  .  lisinopril (PRINIVIL,ZESTRIL) tablet 20 mg  20 mg Oral Daily Jani Gravel, MD   20 mg at 01/31/18 9767  . memantine (NAMENDA) tablet 10 mg  10 mg Oral BID Jani Gravel, MD   10 mg at 01/31/18 3419  . pantoprazole (PROTONIX) EC tablet 40 mg  40 mg Oral Daily Jani Gravel, MD   40 mg at 01/31/18 3790  . pravastatin (PRAVACHOL) tablet 40 mg  40 mg Oral QPM Jani Gravel, MD   40 mg at 01/30/18 1806  . QUEtiapine (SEROQUEL) tablet 100 mg  100 mg Oral QHS Jani Gravel, MD   100 mg at 01/30/18 2131     Discharge Medications:  Discharge Instructions    Discharge instructions   Complete by:  As directed    Discharge patient   Complete by:  As  directed    Discharge disposition:  [facility]   Discharge patient date:  01/31/2018     Allergies as of 01/31/2018   No Known Allergies          Medication List     TAKE these medications   acetaminophen 500 MG tablet Commonly known as:  TYLENOL Take 500 mg by mouth every 6 (six) hours as needed for mild pain or moderate pain.   amLODipine 5 MG tablet Commonly known as:  NORVASC Take 1 tablet (5 mg total) by mouth daily.   aspirin 81 MG chewable tablet Chew 1 tablet (81 mg total) by mouth daily.   buPROPion 300 MG 24 hr tablet Commonly known as:  WELLBUTRIN XL Take 300 mg by mouth every morning.   calcium citrate 950 MG tablet Commonly known as:  CALCITRATE - dosed in mg elemental calcium Take 2 tablets by mouth 2 (two) times daily.   ciprofloxacin 500 MG tablet Commonly known as:  CIPRO Take 1 tablet (500 mg total) by mouth 2 (two) times daily.   docusate sodium 100 MG capsule Commonly known as:  COLACE Take 100 mg by mouth daily.   donepezil 10 MG tablet Commonly known as:  ARICEPT Take 10 mg by mouth daily.   FOSAMAX 70 MG tablet Generic drug:  alendronate Take 70 mg by mouth every 7 (seven) days. Take with a full glass of water on an empty stomach.   isosorbide mononitrate 30 MG 24 hr tablet Commonly known as:  IMDUR Take 90 mg by mouth daily.   lisinopril 20 MG tablet Commonly known as:  PRINIVIL,ZESTRIL TAKE 1&1/2 TABLET (30MG ) BY MOUTH ONCE DAILY.   memantine 10 MG tablet Commonly known as:  NAMENDA Take 10 mg by mouth 2 (two) times daily.   nitroGLYCERIN 0.4 MG SL tablet Commonly known as:  NITROSTAT Place 1 tablet (0.4 mg total) under the tongue every 5 (five) minutes as needed for chest pain.   pantoprazole 40 MG tablet Commonly known as:  PROTONIX Take 40 mg by mouth daily.   pravastatin 40 MG tablet Commonly known as:  PRAVACHOL Take 40 mg by mouth every evening.   QUEtiapine 100 MG  tablet Commonly known as:  SEROQUEL Take 100 mg by mouth at bedtime.   Vitamin D3 5000 units Caps Take 5,000 Units by mouth daily.      No Known Allergies     Relevant Imaging Results:  Relevant Lab Results:   Additional Information    Shade Flood, LCSW

## 2018-01-31 NOTE — Progress Notes (Signed)
Patient is discharged and in stable condition. Patient is to be discharged back to [facility]. Report given to Baker Janus, RN at facility. Awaiting transportation.  Celestia Khat, RN

## 2018-02-01 LAB — URINALYSIS, ROUTINE W REFLEX MICROSCOPIC
Bilirubin Urine: NEGATIVE
Glucose, UA: NEGATIVE mg/dL
Hgb urine dipstick: NEGATIVE
Ketones, ur: NEGATIVE mg/dL
LEUKOCYTES UA: NEGATIVE
Nitrite: NEGATIVE
PROTEIN: NEGATIVE mg/dL
Specific Gravity, Urine: 1.017 (ref 1.005–1.030)
pH: 6 (ref 5.0–8.0)

## 2018-02-01 MED ORDER — CEPHALEXIN 500 MG PO CAPS: 500.0000 mg | ORAL_CAPSULE | Freq: Four times a day (QID) | ORAL | 0 refills | Status: DC

## 2018-02-05 LAB — CULTURE, BLOOD (ROUTINE X 2)
CULTURE: NO GROWTH
Culture: NO GROWTH
SPECIAL REQUESTS: ADEQUATE

## 2018-02-06 ENCOUNTER — Telehealth: Payer: Self-pay | Admitting: Emergency Medicine

## 2018-02-06 NOTE — Telephone Encounter (Signed)
Post ED Visit - Positive Culture Follow-up  Culture report reviewed by antimicrobial stewardship pharmacist:  []  Elenor Quinones, Pharm.D. []  Heide Guile, Pharm.D., BCPS AQ-ID [x]  Parks Neptune, Pharm.D., BCPS []  Alycia Rossetti, Pharm.D., BCPS []  [place], Pharm.D., BCPS, AAHIVP []  Legrand Como, Pharm.D., BCPS, AAHIVP []  Salome Arnt, PharmD, BCPS []  Jalene Mullet, PharmD []  Vincenza Hews, PharmD, BCPS  Positive blood culture Treated with cephalexin, organism sensitive to the same and no further patient follow-up is required at this time.  Hazle Nordmann 02/06/2018, 9:48 AM

## 2018-03-31 ENCOUNTER — Emergency Department (HOSPITAL_COMMUNITY)
Admission: EM | Admit: 2018-03-31 | Discharge: 2018-03-31 | Disposition: A | Discharge status: Skilled Nursing Facility | Payer: Medicare Other | Attending: Emergency Medicine | Admitting: Emergency Medicine

## 2018-03-31 ENCOUNTER — Other Ambulatory Visit: Payer: Self-pay

## 2018-03-31 ENCOUNTER — Encounter (HOSPITAL_COMMUNITY): Payer: Self-pay | Admitting: Emergency Medicine

## 2018-03-31 DIAGNOSIS — F0390 Unspecified dementia without behavioral disturbance: Secondary | ICD-10-CM | POA: Insufficient documentation

## 2018-03-31 DIAGNOSIS — Z79899 Other long term (current) drug therapy: Secondary | ICD-10-CM | POA: Diagnosis not present

## 2018-03-31 DIAGNOSIS — R339 Retention of urine, unspecified: Secondary | ICD-10-CM | POA: Diagnosis present

## 2018-03-31 DIAGNOSIS — I2510 Atherosclerotic heart disease of native coronary artery without angina pectoris: Secondary | ICD-10-CM | POA: Insufficient documentation

## 2018-03-31 DIAGNOSIS — Z0000 Encounter for general adult medical examination without abnormal findings: Secondary | ICD-10-CM | POA: Insufficient documentation

## 2018-03-31 DIAGNOSIS — J449 Chronic obstructive pulmonary disease, unspecified: Secondary | ICD-10-CM | POA: Diagnosis not present

## 2018-03-31 DIAGNOSIS — Z87891 Personal history of nicotine dependence: Secondary | ICD-10-CM | POA: Diagnosis not present

## 2018-03-31 LAB — URINALYSIS, ROUTINE W REFLEX MICROSCOPIC
Bilirubin Urine: NEGATIVE
Glucose, UA: NEGATIVE mg/dL
HGB URINE DIPSTICK: NEGATIVE
Ketones, ur: NEGATIVE mg/dL
Leukocytes, UA: NEGATIVE
Nitrite: NEGATIVE
PH: 8 (ref 5.0–8.0)
Protein, ur: NEGATIVE mg/dL
SPECIFIC GRAVITY, URINE: 1.009 (ref 1.005–1.030)
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
## 2018-03-31 NOTE — ED Triage Notes (Signed)
Patient comes in from [facility]. Caregiver states that pt was able to urinate at 1200 this afternoon but is not urinating. Staff has pushed fluids and patient has been drinking but not voiding. Pt has history of dementia.

## 2018-03-31 NOTE — ED Notes (Signed)
Bladder Scan resulted as >275ml.

## 2018-04-02 LAB — URINE CULTURE: Culture: NO GROWTH

## 2018-04-03 LAB — I-STAT CHEM 8, ED
BUN: 26 mg/dL — ABNORMAL HIGH (ref 6–20)
CALCIUM ION: 1.14 mmol/L — AB (ref 1.15–1.40)
Chloride: 102 mmol/L (ref 101–111)
Creatinine, Ser: 1.3 mg/dL — ABNORMAL HIGH (ref 0.44–1.00)
Glucose, Bld: 98 mg/dL (ref 65–99)
HEMATOCRIT: 40 % (ref 36.0–46.0)
HEMOGLOBIN: 13.6 g/dL (ref 12.0–15.0)
Potassium: 3.7 mmol/L (ref 3.5–5.1)
SODIUM: 141 mmol/L (ref 135–145)
TCO2: 26 mmol/L (ref 22–32)

## 2018-08-23 ENCOUNTER — Encounter (HOSPITAL_COMMUNITY): Payer: Self-pay

## 2018-08-23 ENCOUNTER — Emergency Department (HOSPITAL_COMMUNITY): Payer: Medicare Other

## 2018-08-23 ENCOUNTER — Other Ambulatory Visit: Payer: Self-pay

## 2018-08-23 ENCOUNTER — Emergency Department (HOSPITAL_COMMUNITY)
Admission: EM | Admit: 2018-08-23 | Discharge: 2018-08-23 | Disposition: A | Discharge status: Home / Self Care | Payer: Medicare Other | Attending: Emergency Medicine | Admitting: Emergency Medicine

## 2018-08-23 DIAGNOSIS — F0280 Dementia in other diseases classified elsewhere without behavioral disturbance: Secondary | ICD-10-CM | POA: Insufficient documentation

## 2018-08-23 DIAGNOSIS — G308 Other Alzheimer's disease: Secondary | ICD-10-CM | POA: Diagnosis not present

## 2018-08-23 DIAGNOSIS — I4892 Unspecified atrial flutter: Secondary | ICD-10-CM | POA: Insufficient documentation

## 2018-08-23 DIAGNOSIS — Z87891 Personal history of nicotine dependence: Secondary | ICD-10-CM | POA: Diagnosis not present

## 2018-08-23 DIAGNOSIS — I2510 Atherosclerotic heart disease of native coronary artery without angina pectoris: Secondary | ICD-10-CM | POA: Insufficient documentation

## 2018-08-23 DIAGNOSIS — I10 Essential (primary) hypertension: Secondary | ICD-10-CM | POA: Diagnosis not present

## 2018-08-23 DIAGNOSIS — G9340 Encephalopathy, unspecified: Secondary | ICD-10-CM | POA: Insufficient documentation

## 2018-08-23 DIAGNOSIS — Z79899 Other long term (current) drug therapy: Secondary | ICD-10-CM | POA: Insufficient documentation

## 2018-08-23 DIAGNOSIS — R000 Tachycardia, unspecified: Secondary | ICD-10-CM | POA: Diagnosis present

## 2018-08-23 DIAGNOSIS — Z7982 Long term (current) use of aspirin: Secondary | ICD-10-CM | POA: Diagnosis not present

## 2018-08-23 DIAGNOSIS — J449 Chronic obstructive pulmonary disease, unspecified: Secondary | ICD-10-CM | POA: Insufficient documentation

## 2018-08-23 LAB — URINALYSIS, ROUTINE W REFLEX MICROSCOPIC
Bilirubin Urine: NEGATIVE
Glucose, UA: NEGATIVE mg/dL
HGB URINE DIPSTICK: NEGATIVE
Ketones, ur: NEGATIVE mg/dL
Leukocytes, UA: NEGATIVE
Nitrite: NEGATIVE
Protein, ur: NEGATIVE mg/dL
Specific Gravity, Urine: 1.017 (ref 1.005–1.030)
pH: 6 (ref 5.0–8.0)

## 2018-08-23 LAB — CBC
HCT: 46.7 % — ABNORMAL HIGH (ref 36.0–46.0)
HEMOGLOBIN: 15.3 g/dL — AB (ref 12.0–15.0)
MCH: 28.3 pg (ref 26.0–34.0)
MCHC: 32.8 g/dL (ref 30.0–36.0)
MCV: 86.3 fL (ref 78.0–100.0)
Platelets: 231 10*3/uL (ref 150–400)
RBC: 5.41 MIL/uL — AB (ref 3.87–5.11)
RDW: 15.1 % (ref 11.5–15.5)
WBC: 10.5 10*3/uL (ref 4.0–10.5)

## 2018-08-23 LAB — BASIC METABOLIC PANEL
ANION GAP: 11 (ref 5–15)
BUN: 21 mg/dL (ref 8–23)
CALCIUM: 9.6 mg/dL (ref 8.9–10.3)
CHLORIDE: 105 mmol/L (ref 98–111)
CO2: 25 mmol/L (ref 22–32)
Creatinine, Ser: 1.21 mg/dL — ABNORMAL HIGH (ref 0.44–1.00)
GFR calc non Af Amer: 43 mL/min — ABNORMAL LOW (ref >60)
GFR, EST AFRICAN AMERICAN: 49 mL/min — AB (ref >60)
Glucose, Bld: 154 mg/dL — ABNORMAL HIGH (ref 70–99)
Potassium: 3.7 mmol/L (ref 3.5–5.1)
Sodium: 141 mmol/L (ref 135–145)

## 2018-08-23 LAB — TROPONIN I

## 2018-08-23 LAB — CBG MONITORING, ED: GLUCOSE-CAPILLARY: 167 mg/dL — AB (ref 70–99)

## 2018-08-23 NOTE — ED Triage Notes (Signed)
Pt brought in by EMS from [facility]. Pt found sitting on bench, diaphoretic with HR 130. BP sys 104. EMS reports that pt was recently treated for UTI. EMS reports pt HR low as 60 . EME reports A flutter

## 2018-08-23 NOTE — Discharge Instructions (Signed)
Your testing here has not shown any specific abnormalities.  Please seek a medical exam with your family doctor to be rechecked within the next 2 or 3 days, emergency department for worsening symptoms.
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
## 2018-08-26 ENCOUNTER — Other Ambulatory Visit: Payer: Self-pay

## 2018-08-26 ENCOUNTER — Encounter (HOSPITAL_COMMUNITY): Payer: Self-pay | Admitting: Emergency Medicine

## 2018-08-26 ENCOUNTER — Emergency Department (HOSPITAL_COMMUNITY): Payer: Medicare Other

## 2018-08-26 ENCOUNTER — Emergency Department (HOSPITAL_COMMUNITY)
Admission: EM | Admit: 2018-08-26 | Discharge: 2018-08-26 | Disposition: A | Discharge status: Home / Self Care | Payer: Medicare Other | Attending: Emergency Medicine | Admitting: Emergency Medicine

## 2018-08-26 DIAGNOSIS — Y92129 Unspecified place in nursing home as the place of occurrence of the external cause: Secondary | ICD-10-CM | POA: Insufficient documentation

## 2018-08-26 DIAGNOSIS — Z9049 Acquired absence of other specified parts of digestive tract: Secondary | ICD-10-CM | POA: Insufficient documentation

## 2018-08-26 DIAGNOSIS — I10 Essential (primary) hypertension: Secondary | ICD-10-CM | POA: Diagnosis not present

## 2018-08-26 DIAGNOSIS — F329 Major depressive disorder, single episode, unspecified: Secondary | ICD-10-CM | POA: Insufficient documentation

## 2018-08-26 DIAGNOSIS — Y9389 Activity, other specified: Secondary | ICD-10-CM | POA: Insufficient documentation

## 2018-08-26 DIAGNOSIS — Z23 Encounter for immunization: Secondary | ICD-10-CM | POA: Insufficient documentation

## 2018-08-26 DIAGNOSIS — W01198A Fall on same level from slipping, tripping and stumbling with subsequent striking against other object, initial encounter: Secondary | ICD-10-CM | POA: Diagnosis not present

## 2018-08-26 DIAGNOSIS — Y998 Other external cause status: Secondary | ICD-10-CM | POA: Insufficient documentation

## 2018-08-26 DIAGNOSIS — F0280 Dementia in other diseases classified elsewhere without behavioral disturbance: Secondary | ICD-10-CM | POA: Diagnosis not present

## 2018-08-26 DIAGNOSIS — S0081XA Abrasion of other part of head, initial encounter: Secondary | ICD-10-CM | POA: Diagnosis not present

## 2018-08-26 DIAGNOSIS — Z79899 Other long term (current) drug therapy: Secondary | ICD-10-CM | POA: Insufficient documentation

## 2018-08-26 DIAGNOSIS — I2510 Atherosclerotic heart disease of native coronary artery without angina pectoris: Secondary | ICD-10-CM | POA: Insufficient documentation

## 2018-08-26 DIAGNOSIS — J449 Chronic obstructive pulmonary disease, unspecified: Secondary | ICD-10-CM | POA: Insufficient documentation

## 2018-08-26 DIAGNOSIS — Z7982 Long term (current) use of aspirin: Secondary | ICD-10-CM | POA: Insufficient documentation

## 2018-08-26 DIAGNOSIS — S0990XA Unspecified injury of head, initial encounter: Secondary | ICD-10-CM | POA: Diagnosis present

## 2018-08-26 DIAGNOSIS — G309 Alzheimer's disease, unspecified: Secondary | ICD-10-CM | POA: Diagnosis not present

## 2018-08-26 DIAGNOSIS — W19XXXA Unspecified fall, initial encounter: Secondary | ICD-10-CM

## 2018-08-26 DIAGNOSIS — F419 Anxiety disorder, unspecified: Secondary | ICD-10-CM | POA: Diagnosis not present

## 2018-08-26 MED ORDER — TETANUS-DIPHTH-ACELL PERTUSSIS 5-2.5-18.5 LF-MCG/0.5 IM SUSP
0.5000 mL | Freq: Once | INTRAMUSCULAR | Status: AC
  Administered 2018-08-26: 0.5 mL via INTRAMUSCULAR
  Filled 2018-08-26: qty 0.5

## 2018-08-26 NOTE — ED Triage Notes (Signed)
Pt brought in from [facility] for an unwitnessed fall.  Aide who brought in pt states she did not have and LOC known and falls quite frequently.  Confused at baseline.
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
## 2018-08-26 NOTE — Discharge Instructions (Addendum)
Clean abrasion twice a day with soap and water.  Follow-up with your doctor if any problems

## 2018-08-31 ENCOUNTER — Other Ambulatory Visit: Payer: Self-pay

## 2018-08-31 ENCOUNTER — Encounter (HOSPITAL_COMMUNITY): Payer: Self-pay | Admitting: *Deleted

## 2018-08-31 ENCOUNTER — Emergency Department (HOSPITAL_COMMUNITY): Payer: Medicare Other

## 2018-08-31 ENCOUNTER — Emergency Department (HOSPITAL_COMMUNITY)
Admission: EM | Admit: 2018-08-31 | Discharge: 2018-08-31 | Disposition: A | Discharge status: Home / Self Care | Payer: Medicare Other | Attending: Emergency Medicine | Admitting: Emergency Medicine

## 2018-08-31 DIAGNOSIS — Y92129 Unspecified place in nursing home as the place of occurrence of the external cause: Secondary | ICD-10-CM | POA: Diagnosis not present

## 2018-08-31 DIAGNOSIS — R400 Somnolence: Secondary | ICD-10-CM | POA: Diagnosis not present

## 2018-08-31 DIAGNOSIS — Y999 Unspecified external cause status: Secondary | ICD-10-CM | POA: Insufficient documentation

## 2018-08-31 DIAGNOSIS — Z87891 Personal history of nicotine dependence: Secondary | ICD-10-CM | POA: Diagnosis not present

## 2018-08-31 DIAGNOSIS — I2510 Atherosclerotic heart disease of native coronary artery without angina pectoris: Secondary | ICD-10-CM | POA: Insufficient documentation

## 2018-08-31 DIAGNOSIS — R410 Disorientation, unspecified: Secondary | ICD-10-CM | POA: Diagnosis not present

## 2018-08-31 DIAGNOSIS — S0083XA Contusion of other part of head, initial encounter: Secondary | ICD-10-CM | POA: Insufficient documentation

## 2018-08-31 DIAGNOSIS — W19XXXA Unspecified fall, initial encounter: Secondary | ICD-10-CM | POA: Insufficient documentation

## 2018-08-31 DIAGNOSIS — G309 Alzheimer's disease, unspecified: Secondary | ICD-10-CM | POA: Diagnosis not present

## 2018-08-31 DIAGNOSIS — S161XXA Strain of muscle, fascia and tendon at neck level, initial encounter: Secondary | ICD-10-CM | POA: Diagnosis not present

## 2018-08-31 DIAGNOSIS — Y939 Activity, unspecified: Secondary | ICD-10-CM | POA: Diagnosis not present

## 2018-08-31 DIAGNOSIS — I10 Essential (primary) hypertension: Secondary | ICD-10-CM | POA: Diagnosis not present

## 2018-08-31 DIAGNOSIS — S0990XA Unspecified injury of head, initial encounter: Secondary | ICD-10-CM

## 2018-08-31 DIAGNOSIS — F0281 Dementia in other diseases classified elsewhere with behavioral disturbance: Secondary | ICD-10-CM | POA: Insufficient documentation

## 2018-08-31 LAB — URINALYSIS, ROUTINE W REFLEX MICROSCOPIC
BILIRUBIN URINE: NEGATIVE
GLUCOSE, UA: NEGATIVE mg/dL
Hgb urine dipstick: NEGATIVE
Ketones, ur: NEGATIVE mg/dL
Leukocytes, UA: NEGATIVE
NITRITE: NEGATIVE
PH: 7 (ref 5.0–8.0)
PROTEIN: NEGATIVE mg/dL
Specific Gravity, Urine: 1.013 (ref 1.005–1.030)

## 2018-08-31 LAB — COMPREHENSIVE METABOLIC PANEL
ALK PHOS: 64 U/L (ref 38–126)
ALT: 16 U/L (ref 0–44)
AST: 19 U/L (ref 15–41)
Albumin: 3.8 g/dL (ref 3.5–5.0)
Anion gap: 9 (ref 5–15)
BILIRUBIN TOTAL: 0.4 mg/dL (ref 0.3–1.2)
BUN: 28 mg/dL — AB (ref 8–23)
CALCIUM: 9 mg/dL (ref 8.9–10.3)
CO2: 27 mmol/L (ref 22–32)
CREATININE: 1 mg/dL (ref 0.44–1.00)
Chloride: 108 mmol/L (ref 98–111)
GFR, EST NON AFRICAN AMERICAN: 54 mL/min — AB (ref >60)
Glucose, Bld: 70 mg/dL (ref 70–99)
Potassium: 3.9 mmol/L (ref 3.5–5.1)
Sodium: 144 mmol/L (ref 135–145)
Total Protein: 6.8 g/dL (ref 6.5–8.1)

## 2018-08-31 LAB — I-STAT TROPONIN, ED: Troponin i, poc: 0.01 ng/mL (ref 0.00–0.08)

## 2018-08-31 LAB — CBC WITH DIFFERENTIAL/PLATELET
BASOS PCT: 0 %
Basophils Absolute: 0 10*3/uL (ref 0.0–0.1)
EOS ABS: 0.1 10*3/uL (ref 0.0–0.7)
EOS PCT: 2 %
HCT: 45.6 % (ref 36.0–46.0)
Hemoglobin: 14.7 g/dL (ref 12.0–15.0)
LYMPHS ABS: 2.4 10*3/uL (ref 0.7–4.0)
Lymphocytes Relative: 29 %
MCH: 28.2 pg (ref 26.0–34.0)
MCHC: 32.2 g/dL (ref 30.0–36.0)
MCV: 87.5 fL (ref 78.0–100.0)
Monocytes Absolute: 0.6 10*3/uL (ref 0.1–1.0)
Monocytes Relative: 7 %
NEUTROS PCT: 62 %
Neutro Abs: 5.2 10*3/uL (ref 1.7–7.7)
PLATELETS: 206 10*3/uL (ref 150–400)
RBC: 5.21 MIL/uL — AB (ref 3.87–5.11)
RDW: 15.1 % (ref 11.5–15.5)
WBC: 8.3 10*3/uL (ref 4.0–10.5)

## 2018-08-31 MED ORDER — SODIUM CHLORIDE 0.9 % IV BOLUS
500.0000 mL | Freq: Once | INTRAVENOUS | Status: AC
  Administered 2018-08-31: 500 mL via INTRAVENOUS

## 2018-08-31 NOTE — ED Notes (Signed)
Patient daughter verbalized understanding per discharge instructions. Unable to sign due to signature pad dysfunction.  Patient left with daughter and caregiver all questions and concerns addressed.
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
## 2018-08-31 NOTE — ED Notes (Signed)
Call to [facility] in [place], Patient is ready for discharge.

## 2018-08-31 NOTE — Discharge Instructions (Signed)
You have been seen in the Emergency Department (ED) today for a fall.  Your work up does not show any concerning injuries.  Please take over-the-counter Tylenol as needed for your pain (unless you have an allergy or your doctor as told you not to take them), or take any prescribed medication as instructed. ° °Please follow up with your doctor regarding today's Emergency Department (ED) visit and your recent fall.   ° °Return to the ED if you have any headache, confusion, slurred speech, weakness/numbness of any arm or leg, or any increased pain. ° °
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
## 2018-09-01 LAB — URINE CULTURE
Culture: NO GROWTH
Special Requests: NORMAL
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
## 2018-10-10 ENCOUNTER — Ambulatory Visit (INDEPENDENT_AMBULATORY_CARE_PROVIDER_SITE_OTHER): Payer: Medicare Other | Admitting: Cardiology

## 2018-10-10 ENCOUNTER — Encounter: Payer: Self-pay | Admitting: Cardiology

## 2018-10-10 VITALS — BP 136/78 | HR 85 | Ht 62.0 in | Wt 130.0 lb

## 2018-10-10 DIAGNOSIS — F0280 Dementia in other diseases classified elsewhere without behavioral disturbance: Secondary | ICD-10-CM

## 2018-10-10 DIAGNOSIS — I200 Unstable angina: Secondary | ICD-10-CM | POA: Diagnosis not present

## 2018-10-10 DIAGNOSIS — I483 Typical atrial flutter: Secondary | ICD-10-CM | POA: Diagnosis not present

## 2018-10-10 DIAGNOSIS — G309 Alzheimer's disease, unspecified: Secondary | ICD-10-CM | POA: Diagnosis not present

## 2018-10-10 DIAGNOSIS — I10 Essential (primary) hypertension: Secondary | ICD-10-CM

## 2018-10-10 DIAGNOSIS — I2510 Atherosclerotic heart disease of native coronary artery without angina pectoris: Secondary | ICD-10-CM

## 2018-10-10 NOTE — Patient Instructions (Signed)
Medication Instructions:  Your physician recommends that you continue on your current medications as directed. Please refer to the Current Medication list given to you today.  If you need a refill on your cardiac medications before your next appointment, please call your pharmacy.   Lab work: none If you have labs (blood work) drawn today and your tests are completely normal, you will receive your results only by: . MyChart Message (if you have MyChart) OR . A paper copy in the mail If you have any lab test that is abnormal or we need to change your treatment, we will call you to review the results.  Testing/Procedures: none  Follow-Up: At [facility], you and your health needs are our priority.  As part of our continuing mission to provide you with exceptional heart care, we have created designated Provider Care Teams.  These Care Teams include your primary Cardiologist (physician) and Advanced Practice Providers (APPs -  Physician Assistants and Nurse Practitioners) who all work together to provide you with the care you need, when you need it. You will need a follow up appointment in 1 years.  Please call our office 2 months in advance to schedule this appointment.  You may see Samuel McDowell, MD or one of the following Advanced Practice Providers on your designated Care Team:   Brittany Strader, PA-C ([place] Office) . Michele Lenze, PA-C ([place] Office)  Any Other Special Instructions Will Be Listed Below (If Applicable). None   

## 2018-10-18 ENCOUNTER — Emergency Department (HOSPITAL_COMMUNITY)
Admission: EM | Admit: 2018-10-18 | Discharge: 2018-10-18 | Disposition: A | Discharge status: Skilled Nursing Facility | Payer: Medicare Other | Attending: Emergency Medicine | Admitting: Emergency Medicine

## 2018-10-18 ENCOUNTER — Emergency Department (HOSPITAL_COMMUNITY): Payer: Medicare Other

## 2018-10-18 ENCOUNTER — Other Ambulatory Visit: Payer: Self-pay

## 2018-10-18 ENCOUNTER — Encounter (HOSPITAL_COMMUNITY): Payer: Self-pay | Admitting: Emergency Medicine

## 2018-10-18 DIAGNOSIS — Z87891 Personal history of nicotine dependence: Secondary | ICD-10-CM | POA: Insufficient documentation

## 2018-10-18 DIAGNOSIS — W19XXXA Unspecified fall, initial encounter: Secondary | ICD-10-CM | POA: Insufficient documentation

## 2018-10-18 DIAGNOSIS — J449 Chronic obstructive pulmonary disease, unspecified: Secondary | ICD-10-CM | POA: Insufficient documentation

## 2018-10-18 DIAGNOSIS — Z7982 Long term (current) use of aspirin: Secondary | ICD-10-CM | POA: Diagnosis not present

## 2018-10-18 DIAGNOSIS — Z79899 Other long term (current) drug therapy: Secondary | ICD-10-CM | POA: Insufficient documentation

## 2018-10-18 DIAGNOSIS — Y999 Unspecified external cause status: Secondary | ICD-10-CM | POA: Diagnosis not present

## 2018-10-18 DIAGNOSIS — Y92122 Bedroom in nursing home as the place of occurrence of the external cause: Secondary | ICD-10-CM | POA: Diagnosis not present

## 2018-10-18 DIAGNOSIS — S0990XA Unspecified injury of head, initial encounter: Secondary | ICD-10-CM | POA: Diagnosis present

## 2018-10-18 DIAGNOSIS — I10 Essential (primary) hypertension: Secondary | ICD-10-CM | POA: Insufficient documentation

## 2018-10-18 DIAGNOSIS — G309 Alzheimer's disease, unspecified: Secondary | ICD-10-CM | POA: Diagnosis not present

## 2018-10-18 DIAGNOSIS — Y9389 Activity, other specified: Secondary | ICD-10-CM | POA: Diagnosis not present

## 2018-10-18 DIAGNOSIS — F0280 Dementia in other diseases classified elsewhere without behavioral disturbance: Secondary | ICD-10-CM | POA: Diagnosis not present

## 2018-10-18 NOTE — Discharge Instructions (Addendum)
CT of head without any evidence of brain injury or skull fracture.  Shows evidence of old strokes but nothing acute.

## 2018-10-18 NOTE — ED Notes (Signed)
Patient transported to CT 

## 2018-10-18 NOTE — ED Triage Notes (Addendum)
Unwitnessed fall. Pt found in floor in room at [place] on buttocks leaning on elbow.  Pt is confused which is her normal.  cbg 120.  Pt denies any pain when moving lower and upper extremities.
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
## 2018-12-14 ENCOUNTER — Other Ambulatory Visit: Payer: Self-pay

## 2018-12-14 ENCOUNTER — Emergency Department (HOSPITAL_COMMUNITY)
Admission: EM | Admit: 2018-12-14 | Discharge: 2018-12-14 | Disposition: A | Discharge status: Other Acute Inpatient Hospital | Payer: Medicare Other | Attending: Emergency Medicine | Admitting: Emergency Medicine

## 2018-12-14 ENCOUNTER — Encounter (HOSPITAL_COMMUNITY): Payer: Self-pay | Admitting: Emergency Medicine

## 2018-12-14 ENCOUNTER — Emergency Department (HOSPITAL_COMMUNITY): Payer: Medicare Other

## 2018-12-14 DIAGNOSIS — Y939 Activity, unspecified: Secondary | ICD-10-CM | POA: Insufficient documentation

## 2018-12-14 DIAGNOSIS — Y92129 Unspecified place in nursing home as the place of occurrence of the external cause: Secondary | ICD-10-CM | POA: Insufficient documentation

## 2018-12-14 DIAGNOSIS — I10 Essential (primary) hypertension: Secondary | ICD-10-CM | POA: Diagnosis not present

## 2018-12-14 DIAGNOSIS — W010XXA Fall on same level from slipping, tripping and stumbling without subsequent striking against object, initial encounter: Secondary | ICD-10-CM | POA: Diagnosis not present

## 2018-12-14 DIAGNOSIS — Z7982 Long term (current) use of aspirin: Secondary | ICD-10-CM | POA: Diagnosis not present

## 2018-12-14 DIAGNOSIS — G309 Alzheimer's disease, unspecified: Secondary | ICD-10-CM | POA: Insufficient documentation

## 2018-12-14 DIAGNOSIS — Y999 Unspecified external cause status: Secondary | ICD-10-CM | POA: Diagnosis not present

## 2018-12-14 DIAGNOSIS — Z87891 Personal history of nicotine dependence: Secondary | ICD-10-CM | POA: Insufficient documentation

## 2018-12-14 DIAGNOSIS — J449 Chronic obstructive pulmonary disease, unspecified: Secondary | ICD-10-CM | POA: Diagnosis not present

## 2018-12-14 DIAGNOSIS — S0083XA Contusion of other part of head, initial encounter: Secondary | ICD-10-CM | POA: Diagnosis not present

## 2018-12-14 DIAGNOSIS — Z79899 Other long term (current) drug therapy: Secondary | ICD-10-CM | POA: Diagnosis not present

## 2018-12-14 DIAGNOSIS — I2510 Atherosclerotic heart disease of native coronary artery without angina pectoris: Secondary | ICD-10-CM | POA: Diagnosis not present

## 2018-12-14 DIAGNOSIS — S0990XA Unspecified injury of head, initial encounter: Secondary | ICD-10-CM

## 2018-12-14 DIAGNOSIS — J0100 Acute maxillary sinusitis, unspecified: Secondary | ICD-10-CM | POA: Insufficient documentation

## 2018-12-14 DIAGNOSIS — W19XXXA Unspecified fall, initial encounter: Secondary | ICD-10-CM

## 2018-12-14 MED ORDER — LORATADINE-PSEUDOEPHEDRINE ER 5-120 MG PO TB12: 1.0000 | ORAL_TABLET | Freq: Two times a day (BID) | ORAL | 0 refills | Status: DC

## 2018-12-14 MED ORDER — AMOXICILLIN 500 MG PO CAPS: 500.0000 mg | ORAL_CAPSULE | Freq: Three times a day (TID) | ORAL | 0 refills | Status: DC

## 2018-12-14 MED ORDER — AMOXICILLIN 500 MG PO CAPS: 500.0000 mg | ORAL_CAPSULE | Freq: Three times a day (TID) | ORAL | 0 refills | Status: AC

## 2018-12-14 NOTE — ED Notes (Signed)
Undressed patient and placed in hospital gown.

## 2018-12-14 NOTE — Discharge Instructions (Signed)
Your CT imaging is negative for any acute injury but you do have an acute sinus infection which you have been prescribed antibiotics.  Take the entire course of this antibiotic.  In addition take the Claritin D medication to help you with nasal congestion.  Plan to see your doctor for recheck if your symptoms persist or worsen.
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
## 2018-12-14 NOTE — ED Triage Notes (Signed)
Patient brought in by EMS from [facility] for fall. States patient was walking and fell onto the carpet. Patient has abrasions noted to forehead and bridge of nose. No LOC. Patient denies pain at triage.

## 2018-12-14 NOTE — ED Notes (Signed)
[place] and spoke with [facility] and gave report. Dawn requested e-scripts to be printed due to them unable to pick up prescription medication from [facility]. Advised Evalee Jefferson PA and she printed prescriptions to be sent to facility. Dressed patient and placed in bed. Per Dawn at [facility] she will send someone to pick up patient.

## 2019-01-02 ENCOUNTER — Emergency Department (HOSPITAL_COMMUNITY)
Admission: EM | Admit: 2019-01-02 | Discharge: 2019-01-02 | Disposition: A | Discharge status: Home / Self Care | Payer: Medicare Other | Attending: Emergency Medicine | Admitting: Emergency Medicine

## 2019-01-02 ENCOUNTER — Emergency Department (HOSPITAL_COMMUNITY): Payer: Medicare Other

## 2019-01-02 ENCOUNTER — Encounter (HOSPITAL_COMMUNITY): Payer: Self-pay

## 2019-01-02 ENCOUNTER — Other Ambulatory Visit: Payer: Self-pay

## 2019-01-02 DIAGNOSIS — I10 Essential (primary) hypertension: Secondary | ICD-10-CM | POA: Diagnosis not present

## 2019-01-02 DIAGNOSIS — G309 Alzheimer's disease, unspecified: Secondary | ICD-10-CM | POA: Diagnosis not present

## 2019-01-02 DIAGNOSIS — Z79899 Other long term (current) drug therapy: Secondary | ICD-10-CM | POA: Diagnosis not present

## 2019-01-02 DIAGNOSIS — Z7982 Long term (current) use of aspirin: Secondary | ICD-10-CM | POA: Diagnosis not present

## 2019-01-02 DIAGNOSIS — R55 Syncope and collapse: Secondary | ICD-10-CM | POA: Insufficient documentation

## 2019-01-02 DIAGNOSIS — I2510 Atherosclerotic heart disease of native coronary artery without angina pectoris: Secondary | ICD-10-CM | POA: Insufficient documentation

## 2019-01-02 DIAGNOSIS — Z87440 Personal history of urinary (tract) infections: Secondary | ICD-10-CM | POA: Insufficient documentation

## 2019-01-02 DIAGNOSIS — J449 Chronic obstructive pulmonary disease, unspecified: Secondary | ICD-10-CM | POA: Diagnosis not present

## 2019-01-02 DIAGNOSIS — Z87891 Personal history of nicotine dependence: Secondary | ICD-10-CM | POA: Diagnosis not present

## 2019-01-02 LAB — URINALYSIS, ROUTINE W REFLEX MICROSCOPIC
Bacteria, UA: NONE SEEN
Bilirubin Urine: NEGATIVE
Glucose, UA: NEGATIVE mg/dL
Hgb urine dipstick: NEGATIVE
Ketones, ur: NEGATIVE mg/dL
Nitrite: NEGATIVE
PH: 5 (ref 5.0–8.0)
Protein, ur: 30 mg/dL — AB
Specific Gravity, Urine: 1.018 (ref 1.005–1.030)

## 2019-01-02 LAB — CBC WITH DIFFERENTIAL/PLATELET
ABS IMMATURE GRANULOCYTES: 0.06 10*3/uL (ref 0.00–0.07)
Basophils Absolute: 0 10*3/uL (ref 0.0–0.1)
Basophils Relative: 0 %
Eosinophils Absolute: 0.1 10*3/uL (ref 0.0–0.5)
Eosinophils Relative: 0 %
HCT: 47.8 % — ABNORMAL HIGH (ref 36.0–46.0)
Hemoglobin: 14.8 g/dL (ref 12.0–15.0)
Immature Granulocytes: 0 %
Lymphocytes Relative: 18 %
Lymphs Abs: 2.4 10*3/uL (ref 0.7–4.0)
MCH: 27.4 pg (ref 26.0–34.0)
MCHC: 31 g/dL (ref 30.0–36.0)
MCV: 88.5 fL (ref 80.0–100.0)
MONO ABS: 0.7 10*3/uL (ref 0.1–1.0)
Monocytes Relative: 5 %
NEUTROS ABS: 10.2 10*3/uL — AB (ref 1.7–7.7)
Neutrophils Relative %: 77 %
Platelets: 213 10*3/uL (ref 150–400)
RBC: 5.4 MIL/uL — AB (ref 3.87–5.11)
RDW: 14.9 % (ref 11.5–15.5)
WBC: 13.4 10*3/uL — ABNORMAL HIGH (ref 4.0–10.5)
nRBC: 0 % (ref 0.0–0.2)

## 2019-01-02 LAB — BASIC METABOLIC PANEL
Anion gap: 9 (ref 5–15)
BUN: 23 mg/dL (ref 8–23)
CO2: 25 mmol/L (ref 22–32)
Calcium: 9.7 mg/dL (ref 8.9–10.3)
Chloride: 106 mmol/L (ref 98–111)
Creatinine, Ser: 1.19 mg/dL — ABNORMAL HIGH (ref 0.44–1.00)
GFR calc Af Amer: 51 mL/min — ABNORMAL LOW (ref >60)
GFR calc non Af Amer: 44 mL/min — ABNORMAL LOW (ref >60)
Glucose, Bld: 169 mg/dL — ABNORMAL HIGH (ref 70–99)
Potassium: 4.2 mmol/L (ref 3.5–5.1)
Sodium: 140 mmol/L (ref 135–145)

## 2019-01-02 LAB — CBG MONITORING, ED: Glucose-Capillary: 143 mg/dL — ABNORMAL HIGH (ref 70–99)

## 2019-01-02 NOTE — ED Triage Notes (Signed)
CBG 151 per ems.

## 2019-01-02 NOTE — ED Triage Notes (Addendum)
EMS reports pt was finishing eating breakfast and staff noticed her shaking and pt went unresponsive briefly.  Pt alert, denies pain.  EMS reports pt has history of dementia.  Pt noted to have bruises, yellow in color, under eyes.  Pt ST on monitor.  Saline lock started by ems.   Pt recently finished antibiotics for uti.

## 2019-01-02 NOTE — Discharge Instructions (Addendum)
Labs and imaging are negative today for source of this syncopal event.  Plan a followup with her primary doctor for a recheck within one week.
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
## 2019-01-03 ENCOUNTER — Emergency Department (HOSPITAL_COMMUNITY): Payer: Medicare Other

## 2019-01-03 ENCOUNTER — Other Ambulatory Visit: Payer: Self-pay

## 2019-01-03 ENCOUNTER — Observation Stay (HOSPITAL_COMMUNITY)
Admission: EM | Admit: 2019-01-03 | Discharge: 2019-01-04 | Disposition: A | Discharge status: Home / Self Care | Payer: Medicare Other | Attending: Internal Medicine | Admitting: Internal Medicine

## 2019-01-03 ENCOUNTER — Encounter (HOSPITAL_COMMUNITY): Payer: Self-pay | Admitting: Emergency Medicine

## 2019-01-03 DIAGNOSIS — Z87891 Personal history of nicotine dependence: Secondary | ICD-10-CM | POA: Insufficient documentation

## 2019-01-03 DIAGNOSIS — N179 Acute kidney failure, unspecified: Secondary | ICD-10-CM | POA: Diagnosis not present

## 2019-01-03 DIAGNOSIS — E7801 Familial hypercholesterolemia: Secondary | ICD-10-CM

## 2019-01-03 DIAGNOSIS — F0390 Unspecified dementia without behavioral disturbance: Secondary | ICD-10-CM | POA: Insufficient documentation

## 2019-01-03 DIAGNOSIS — N183 Chronic kidney disease, stage 3 unspecified: Secondary | ICD-10-CM

## 2019-01-03 DIAGNOSIS — E785 Hyperlipidemia, unspecified: Secondary | ICD-10-CM | POA: Insufficient documentation

## 2019-01-03 DIAGNOSIS — N3000 Acute cystitis without hematuria: Principal | ICD-10-CM | POA: Insufficient documentation

## 2019-01-03 DIAGNOSIS — I129 Hypertensive chronic kidney disease with stage 1 through stage 4 chronic kidney disease, or unspecified chronic kidney disease: Secondary | ICD-10-CM | POA: Diagnosis not present

## 2019-01-03 DIAGNOSIS — I2510 Atherosclerotic heart disease of native coronary artery without angina pectoris: Secondary | ICD-10-CM | POA: Insufficient documentation

## 2019-01-03 DIAGNOSIS — I10 Essential (primary) hypertension: Secondary | ICD-10-CM | POA: Diagnosis not present

## 2019-01-03 DIAGNOSIS — D519 Vitamin B12 deficiency anemia, unspecified: Secondary | ICD-10-CM | POA: Insufficient documentation

## 2019-01-03 DIAGNOSIS — G309 Alzheimer's disease, unspecified: Secondary | ICD-10-CM | POA: Diagnosis not present

## 2019-01-03 DIAGNOSIS — K219 Gastro-esophageal reflux disease without esophagitis: Secondary | ICD-10-CM | POA: Diagnosis not present

## 2019-01-03 DIAGNOSIS — F0280 Dementia in other diseases classified elsewhere without behavioral disturbance: Secondary | ICD-10-CM

## 2019-01-03 DIAGNOSIS — R55 Syncope and collapse: Secondary | ICD-10-CM

## 2019-01-03 DIAGNOSIS — J449 Chronic obstructive pulmonary disease, unspecified: Secondary | ICD-10-CM | POA: Diagnosis not present

## 2019-01-03 LAB — CBC WITH DIFFERENTIAL/PLATELET
Abs Immature Granulocytes: 0.06 10*3/uL (ref 0.00–0.07)
Basophils Absolute: 0 10*3/uL (ref 0.0–0.1)
Basophils Relative: 0 %
EOS ABS: 0 10*3/uL (ref 0.0–0.5)
Eosinophils Relative: 0 %
HEMATOCRIT: 50.2 % — AB (ref 36.0–46.0)
Hemoglobin: 15 g/dL (ref 12.0–15.0)
Immature Granulocytes: 0 %
LYMPHS ABS: 2 10*3/uL (ref 0.7–4.0)
Lymphocytes Relative: 13 %
MCH: 27.4 pg (ref 26.0–34.0)
MCHC: 29.9 g/dL — ABNORMAL LOW (ref 30.0–36.0)
MCV: 91.6 fL (ref 80.0–100.0)
Monocytes Absolute: 0.9 10*3/uL (ref 0.1–1.0)
Monocytes Relative: 6 %
Neutro Abs: 12 10*3/uL — ABNORMAL HIGH (ref 1.7–7.7)
Neutrophils Relative %: 81 %
Platelets: 229 10*3/uL (ref 150–400)
RBC: 5.48 MIL/uL — ABNORMAL HIGH (ref 3.87–5.11)
RDW: 15 % (ref 11.5–15.5)
WBC: 15 10*3/uL — ABNORMAL HIGH (ref 4.0–10.5)
nRBC: 0 % (ref 0.0–0.2)

## 2019-01-03 LAB — URINALYSIS, ROUTINE W REFLEX MICROSCOPIC
Bilirubin Urine: NEGATIVE
Glucose, UA: NEGATIVE mg/dL
Hgb urine dipstick: NEGATIVE
Ketones, ur: NEGATIVE mg/dL
Nitrite: NEGATIVE
Protein, ur: 30 mg/dL — AB
Specific Gravity, Urine: 1.024 (ref 1.005–1.030)
pH: 5 (ref 5.0–8.0)

## 2019-01-03 LAB — BASIC METABOLIC PANEL
Anion gap: 15 (ref 5–15)
BUN: 50 mg/dL — ABNORMAL HIGH (ref 8–23)
CALCIUM: 10 mg/dL (ref 8.9–10.3)
CO2: 19 mmol/L — ABNORMAL LOW (ref 22–32)
Chloride: 109 mmol/L (ref 98–111)
Creatinine, Ser: 1.68 mg/dL — ABNORMAL HIGH (ref 0.44–1.00)
GFR calc Af Amer: 34 mL/min — ABNORMAL LOW (ref >60)
GFR calc non Af Amer: 29 mL/min — ABNORMAL LOW (ref >60)
Glucose, Bld: 127 mg/dL — ABNORMAL HIGH (ref 70–99)
Potassium: 3.5 mmol/L (ref 3.5–5.1)
Sodium: 143 mmol/L (ref 135–145)

## 2019-01-03 LAB — INFLUENZA PANEL BY PCR (TYPE A & B)
INFLAPCR: NEGATIVE
Influenza B By PCR: NEGATIVE

## 2019-01-03 LAB — TROPONIN I
Troponin I: 0.04 ng/mL (ref <0.03)
Troponin I: 0.05 ng/mL (ref <0.03)

## 2019-01-03 LAB — VITAMIN B12: Vitamin B-12: 173 pg/mL — ABNORMAL LOW (ref 180–914)

## 2019-01-03 LAB — MRSA PCR SCREENING: MRSA by PCR: POSITIVE — AB

## 2019-01-03 LAB — TSH: TSH: 0.463 u[IU]/mL (ref 0.350–4.500)

## 2019-01-03 MED ORDER — DONEPEZIL HCL 5 MG PO TABS
10.0000 mg | ORAL_TABLET | Freq: Every day | ORAL | Status: DC
  Administered 2019-01-04: 10 mg via ORAL
  Filled 2019-01-03: qty 2

## 2019-01-03 MED ORDER — ACETAMINOPHEN 500 MG PO TABS: 500.0000 mg | ORAL_TABLET | Freq: Four times a day (QID) | ORAL | Status: DC | PRN

## 2019-01-03 MED ORDER — BUPROPION HCL ER (XL) 300 MG PO TB24
300.0000 mg | ORAL_TABLET | Freq: Every day | ORAL | Status: DC
  Administered 2019-01-04: 300 mg via ORAL
  Filled 2019-01-03: qty 1

## 2019-01-03 MED ORDER — MUPIROCIN 2 % EX OINT
1.0000 "application " | TOPICAL_OINTMENT | Freq: Two times a day (BID) | CUTANEOUS | Status: DC
  Administered 2019-01-03 – 2019-01-04 (×2): 1 via NASAL
  Filled 2019-01-03: qty 22

## 2019-01-03 MED ORDER — SODIUM CHLORIDE 0.9 % IV SOLN
1.0000 g | Freq: Once | INTRAVENOUS | Status: AC
  Administered 2019-01-03: 1 g via INTRAVENOUS
  Filled 2019-01-03: qty 10

## 2019-01-03 MED ORDER — SODIUM CHLORIDE 0.9 % IV BOLUS
500.0000 mL | Freq: Once | INTRAVENOUS | Status: AC
  Administered 2019-01-03: 500 mL via INTRAVENOUS

## 2019-01-03 MED ORDER — SODIUM CHLORIDE 0.9 % IV SOLN
INTRAVENOUS | Status: DC
  Administered 2019-01-03 – 2019-01-04 (×2): via INTRAVENOUS

## 2019-01-03 MED ORDER — HEPARIN SODIUM (PORCINE) 5000 UNIT/ML IJ SOLN
5000.0000 [IU] | Freq: Three times a day (TID) | INTRAMUSCULAR | Status: DC
  Administered 2019-01-03 – 2019-01-04 (×2): 5000 [IU] via SUBCUTANEOUS
  Filled 2019-01-03 (×2): qty 1

## 2019-01-03 MED ORDER — CHLORHEXIDINE GLUCONATE CLOTH 2 % EX PADS
6.0000 | MEDICATED_PAD | Freq: Every day | CUTANEOUS | Status: DC
  Administered 2019-01-04: 6 via TOPICAL

## 2019-01-03 MED ORDER — ASPIRIN 81 MG PO CHEW
81.0000 mg | CHEWABLE_TABLET | Freq: Every day | ORAL | Status: DC
  Administered 2019-01-04: 81 mg via ORAL
  Filled 2019-01-03: qty 1

## 2019-01-03 MED ORDER — PRAVASTATIN SODIUM 40 MG PO TABS
40.0000 mg | ORAL_TABLET | Freq: Every evening | ORAL | Status: DC
  Administered 2019-01-03: 40 mg via ORAL
  Filled 2019-01-03: qty 1

## 2019-01-03 MED ORDER — MEMANTINE HCL 10 MG PO TABS
10.0000 mg | ORAL_TABLET | Freq: Two times a day (BID) | ORAL | Status: DC
  Administered 2019-01-03 – 2019-01-04 (×2): 10 mg via ORAL
  Filled 2019-01-03 (×2): qty 1

## 2019-01-03 MED ORDER — QUETIAPINE FUMARATE 100 MG PO TABS
100.0000 mg | ORAL_TABLET | Freq: Every day | ORAL | Status: DC
  Administered 2019-01-03: 100 mg via ORAL
  Filled 2019-01-03: qty 1

## 2019-01-03 MED ORDER — PANTOPRAZOLE SODIUM 40 MG PO TBEC
40.0000 mg | DELAYED_RELEASE_TABLET | Freq: Every day | ORAL | Status: DC
  Administered 2019-01-04: 40 mg via ORAL
  Filled 2019-01-03: qty 1

## 2019-01-03 NOTE — Plan of Care (Signed)
Notified on call, that patient did not have a code status. Awaiting response.
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
## 2019-01-03 NOTE — ED Triage Notes (Signed)
Pt from [facility].  Staff states pt was eating breakfast and "passed out " after eating.  CBG 156. VSS.  A/O with dementia   Same episode happened yesterday and was seen here and discharged.

## 2019-01-03 NOTE — ED Notes (Signed)
Date and time results received: 01/03/19 1128 (use smartphrase ".now" to insert current time)  Test: trop Critical Value: 0.04  Name of Provider Notified: dr long   Orders Received? Or Actions Taken?: see chart
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
## 2019-01-04 DIAGNOSIS — N179 Acute kidney failure, unspecified: Secondary | ICD-10-CM | POA: Diagnosis not present

## 2019-01-04 DIAGNOSIS — N3000 Acute cystitis without hematuria: Secondary | ICD-10-CM | POA: Diagnosis not present

## 2019-01-04 DIAGNOSIS — G309 Alzheimer's disease, unspecified: Secondary | ICD-10-CM | POA: Diagnosis not present

## 2019-01-04 DIAGNOSIS — R55 Syncope and collapse: Secondary | ICD-10-CM | POA: Diagnosis not present

## 2019-01-04 DIAGNOSIS — I10 Essential (primary) hypertension: Secondary | ICD-10-CM | POA: Diagnosis not present

## 2019-01-04 LAB — URINE CULTURE: Culture: NO GROWTH

## 2019-01-04 LAB — CBC
HCT: 43.4 % (ref 36.0–46.0)
Hemoglobin: 13.2 g/dL (ref 12.0–15.0)
MCH: 27.4 pg (ref 26.0–34.0)
MCHC: 30.4 g/dL (ref 30.0–36.0)
MCV: 90.2 fL (ref 80.0–100.0)
NRBC: 0 % (ref 0.0–0.2)
Platelets: 217 10*3/uL (ref 150–400)
RBC: 4.81 MIL/uL (ref 3.87–5.11)
RDW: 14.9 % (ref 11.5–15.5)
WBC: 11.2 10*3/uL — AB (ref 4.0–10.5)

## 2019-01-04 LAB — BASIC METABOLIC PANEL
Anion gap: 6 (ref 5–15)
BUN: 35 mg/dL — ABNORMAL HIGH (ref 8–23)
CHLORIDE: 113 mmol/L — AB (ref 98–111)
CO2: 26 mmol/L (ref 22–32)
Calcium: 8.4 mg/dL — ABNORMAL LOW (ref 8.9–10.3)
Creatinine, Ser: 0.95 mg/dL (ref 0.44–1.00)
GFR calc Af Amer: >60 mL/min (ref >60)
GFR calc non Af Amer: 58 mL/min — ABNORMAL LOW (ref >60)
Glucose, Bld: 88 mg/dL (ref 70–99)
Potassium: 3 mmol/L — ABNORMAL LOW (ref 3.5–5.1)
SODIUM: 145 mmol/L (ref 135–145)

## 2019-01-04 LAB — CORTISOL: Cortisol, Plasma: 10.8 ug/dL

## 2019-01-04 LAB — TROPONIN I
Troponin I: 0.04 ng/mL (ref <0.03)
Troponin I: 0.03 ng/mL (ref <0.03)

## 2019-01-04 MED ORDER — LISINOPRIL 10 MG PO TABS: 15.0000 mg | ORAL_TABLET | Freq: Every day | ORAL | 1 refills | Status: DC

## 2019-01-04 MED ORDER — VITAMIN B-12 1000 MCG PO TABS: 1000.0000 ug | ORAL_TABLET | Freq: Two times a day (BID) | ORAL | 3 refills | Status: DC

## 2019-01-04 MED ORDER — POTASSIUM CHLORIDE ER 10 MEQ PO TBCR: 20.0000 meq | EXTENDED_RELEASE_TABLET | Freq: Two times a day (BID) | ORAL | 0 refills | Status: DC

## 2019-01-04 MED ORDER — ISOSORBIDE MONONITRATE ER 30 MG PO TB24: 60.0000 mg | ORAL_TABLET | Freq: Every day | ORAL | Status: AC

## 2019-01-04 MED ORDER — AMLODIPINE BESYLATE 5 MG PO TABS: 2.5000 mg | ORAL_TABLET | Freq: Every day | ORAL | Status: DC

## 2019-01-04 NOTE — Care Management Note (Signed)
Case Management Note  Patient Details  Name: Melissa Baird MRN: [id number] Date of Birth: 09-Dec-1941  Subjective/Objective:    Observation from [facility] ALF with syncope. PT recommends HH PT. Facility requests [facility] be provider. CMS provider list given. Facility aware HH has 48 hrs to make first visit.                 Action/Plan: DC back to ALF with HH PT. [facility] rep given referral. CSW to arrange return to facility.   Expected Discharge Date:      01/04/2019            Expected Discharge Plan:  Assisted Living / Rest Home  In-House Referral:  Clinical Social Work  Discharge planning Services  CM Consult  Post Acute Care Choice:  Home Health Choice offered to:  (facility)  HH Arranged:  PT HH Agency:  [facility]  Status of Service:  Completed, signed off  Sherald Barge, RN 01/04/2019, 11:09 AM

## 2019-01-04 NOTE — NC FL2 (Signed)
[place] LEVEL OF CARE SCREENING TOOL     IDENTIFICATION  Patient Name: Melissa Baird Birthdate: 1941/06/08 Sex: female Admission Date (Current Location): 01/03/2019  [place] and [place] Number:  [employer] and Address:  [facility] [address] [place]      Provider Number: [id number]  Attending Physician Name and Address:  Barton Dubois, MD  Relative Name and Phone Number:       Current Level of Care: Other (Comment)(obs) Recommended Level of Care: Assisted Living Facility Prior Approval Number:    Date Approved/Denied:   PASRR Number:    Discharge Plan: Domiciliary (Rest home)(ALF)    Current Diagnoses: Patient Active Problem List   Diagnosis Date Noted  . GERD (gastroesophageal reflux disease) 01/03/2019  . Acute renal failure superimposed on stage 3 chronic kidney disease ([place]) 01/03/2019  . Pressure injury of skin 01/25/2018  . Sepsis ([place]) 01/24/2018  . UTI (urinary tract infection) 01/24/2018  . Tachycardia 01/24/2018  . Influenza 12/30/2016  . Hypoxia 12/30/2016  . Loss of consciousness ([place]) 10/12/2016  . Chest pain 02/23/2016  . Dementia ([place])   . Unstable angina ([place]) 06/17/2013  . HLD (hyperlipidemia) 06/17/2013  . TOBACCO ABUSE 09/30/2010  . 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 [place] DISEASE, LUMBAR 09/14/2010  . Back pain, chronic 09/14/2010  . Depression 09/03/2010  . Essential hypertension 09/03/2010  . SYNCOPE 09/03/2010    Orientation RESPIRATION BLADDER Height & Weight     Self  Normal Incontinent Weight: 138 lb (62.6 kg) Height:  5\' 6"  (167.6 cm)  BEHAVIORAL SYMPTOMS/MOOD NEUROLOGICAL BOWEL NUTRITION STATUS      Incontinent (heart healthy which facility indicates equates to their regular diet.)  AMBULATORY STATUS COMMUNICATION OF NEEDS Skin   Limited Assist Verbally Normal                       Personal Care Assistance Level of Assistance  Bathing,  Feeding, Dressing Bathing Assistance: Limited assistance Feeding assistance: Limited assistance(staff feeds patient ) Dressing Assistance: Limited assistance     Functional Limitations Info  Sight, Hearing, Speech Sight Info: Adequate Hearing Info: Adequate Speech Info: Adequate    SPECIAL CARE FACTORS FREQUENCY  PT (By licensed PT)     PT Frequency: 3x/week               Contractures Contractures Info: Not present    Additional Factors Info  Code Status, Allergies, Psychotropic Code Status Info: DNR Allergies Info: NKA Psychotropic Info: Wellbutrin, Seroquel         Current Medications (01/04/2019):  This is the current hospital active medication list Current Facility-Administered Medications  Medication Dose Route Frequency Provider Last Rate Last Dose  . 0.9 %  sodium chloride infusion   Intravenous Continuous Barton Dubois, MD 75 mL/hr at 01/04/19 0605    . acetaminophen (TYLENOL) tablet 500 mg  500 mg Oral Q6H PRN Barton Dubois, MD      . aspirin chewable tablet 81 mg  81 mg Oral Daily Barton Dubois, MD   81 mg at 01/04/19 9767  . buPROPion (WELLBUTRIN XL) 24 hr tablet 300 mg  300 mg Oral Daily Barton Dubois, MD   300 mg at 01/04/19 0925  . Chlorhexidine Gluconate Cloth 2 % PADS 6 each  6 each Topical Q0600 Barton Dubois, MD   6 each at 01/04/19 [phone]  . donepezil (ARICEPT) tablet 10 mg  10 mg Oral Daily Barton Dubois,  MD   10 mg at 01/04/19 0925  . heparin injection 5,000 Units  5,000 Units Subcutaneous Q8H Barton Dubois, MD   5,000 Units at 01/04/19 [phone]  . memantine (NAMENDA) tablet 10 mg  10 mg Oral BID Barton Dubois, MD   10 mg at 01/04/19 0925  . mupirocin ointment (BACTROBAN) 2 % 1 application  1 application Nasal BID Barton Dubois, MD   1 application at 92/42/68 [phone]  . pantoprazole (PROTONIX) EC tablet 40 mg  40 mg Oral Daily Barton Dubois, MD   40 mg at 01/04/19 0925  . pravastatin (PRAVACHOL) tablet 40 mg  40 mg Oral QPM Barton Dubois, MD   40 mg  at 01/03/19 2100  . QUEtiapine (SEROQUEL) tablet 100 mg  100 mg Oral QHS Barton Dubois, MD   100 mg at 01/03/19 2100     Discharge Medications: TAKE these medications   acetaminophen 500 MG tablet Commonly known as:  TYLENOL Take 500 mg by mouth every 6 (six) hours as needed for mild pain or moderate pain.   amLODipine 5 MG tablet Commonly known as:  NORVASC Take 0.5 tablets (2.5 mg total) by mouth daily. What changed:  how much to take   aspirin 81 MG chewable tablet Chew 1 tablet (81 mg total) by mouth daily.   buPROPion 300 MG 24 hr tablet Commonly known as:  WELLBUTRIN XL Take 300 mg by mouth every morning.   calcium citrate 950 MG tablet Commonly known as:  CALCITRATE - dosed in mg elemental calcium Take 2 tablets by mouth 2 (two) times daily.   docusate sodium 100 MG capsule Commonly known as:  COLACE Take 100 mg by mouth 2 (two) times daily.   donepezil 10 MG tablet Commonly known as:  ARICEPT Take 10 mg by mouth daily.   FOSAMAX 70 MG tablet Generic drug:  alendronate Take 70 mg by mouth every 7 (seven) days. Take with a full glass of water on an empty stomach.   isosorbide mononitrate 30 MG 24 hr tablet Commonly known as:  IMDUR Take 2 tablets (60 mg total) by mouth daily. What changed:  how much to take   lisinopril 10 MG tablet Commonly known as:  PRINIVIL,ZESTRIL Take 1.5 tablets (15 mg total) by mouth daily. What changed:    medication strength  See the new instructions.   loratadine-pseudoephedrine 5-120 MG tablet Commonly known as:  CLARITIN-D 12 HOUR Take 1 tablet by mouth 2 (two) times daily.   memantine 10 MG tablet Commonly known as:  NAMENDA Take 10 mg by mouth 2 (two) times daily.   nitroGLYCERIN 0.4 MG SL tablet Commonly known as:  NITROSTAT Place 1 tablet (0.4 mg total) under the tongue every 5 (five) minutes as needed for chest pain.   pantoprazole 40 MG tablet Commonly known as:  PROTONIX Take 40 mg by mouth  daily.   potassium chloride 10 MEQ tablet Commonly known as:  K-DUR Take 2 tablets (20 mEq total) by mouth 2 (two) times daily for 2 days.   pravastatin 40 MG tablet Commonly known as:  PRAVACHOL Take 40 mg by mouth every evening.   QUEtiapine 100 MG tablet Commonly known as:  SEROQUEL Take 100 mg by mouth at bedtime.   Vitamin D3 125 MCG (5000 UT) Caps Take 1 capsule by mouth daily.    Relevant Imaging Results:  Relevant Lab Results:   Additional Information SSN: [id number]  Adryen Cookson, Clydene Pugh, LCSW
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
## 2019-01-04 NOTE — Clinical Social Work Note (Signed)
Clinical Social Work Assessment  Patient Details  Name: Melissa Baird MRN: [id number] Date of Birth: 1941/01/13  Date of referral:  01/04/19               Reason for consult:  Other (Comment Required)(From [facility])                Permission sought to share information with:    Permission granted to share information::     Name::        Agency::  [facility] at [facility]  Relationship::  Daughter, listed on chart.   Contact Information:     Housing/Transportation Living arrangements for the past 2 months:  [place] of Information:  Adult Children, Facility Patient Interpreter Needed:  None Criminal Activity/Legal Involvement Pertinent to Current Situation/Hospitalization:  No - Comment as needed Significant Relationships:  Adult Children Lives with:  Facility Resident Do you feel safe going back to the place where you live?  Yes Need for family participation in patient care:  Yes (Comment)  Care giving concerns:  None identified.    Social Worker assessment / plan:  Pt admitted from [facility]. Spoke with Butch Penny at the ALF to update. Pt has dementia. She has been at the ALF for about two years. She ambulates independently but does hold on to the railings. Pt receives assistance with bathing, toileting, and dressing. Staff feeds it. Pt has good family support. Butch Penny and Daughter provided history.   Employment status:  Retired Forensic scientist:  Medicare PT Recommendations:  Home with [place] / Referral to community resources:     Patient/Family's Response to care:  Family is agreeable to return to [facility] [employer].  Patient/Family's Understanding of and Emotional Response to Diagnosis, Current Treatment, and Prognosis:  Daughter understands patient's diagnosis, treatment and prognosis.   Emotional Assessment Appearance:  Appears stated age Attitude/Demeanor/Rapport:    Affect (typically observed):  Unable to Assess Orientation:   Oriented to Self Alcohol / Substance use:  Not Applicable Psych involvement (Current and /or in the community):  No (Comment)  Discharge Needs  Concerns to be addressed:  Other (Comment Required(return to [facility]) Readmission within the last 30 days:  No Current discharge risk:  None Barriers to Discharge:  No Barriers Identified   Ihor Gully, LCSW 01/04/2019, 11:26 AM

## 2019-01-04 NOTE — Evaluation (Signed)
Physical Therapy Evaluation Patient Details Name: Melissa Baird MRN: [id number] DOB: Nov 30, 1941 Today's Date: 01/04/2019   History of Present Illness  Melissa Baird is a 78 y.o. female with PMH significant for alzheimer's dementia, depression, anxiety, non obstructive CAD, COPD, HTN, GERD, hx of frequent UTI, CKD stage 3 and HLD; brought in from SNF due to episode of syncope. Apparently patient with second episode of passing out after breakfast; no prodromic symptoms, no fever, no changes in medications, no complaints of CP, SOB, nausea, vomiting, fever, chills, dysuria, hematuria or any other complaints. On her fisrt event work up negative in ED and patient discharge back to SNF. Today patient demonstrating signs of dehydration, AKI and soft BP.     Clinical Impression  Patient functioning near baseline for functional mobility and gait, requires repeated verbal/tactile cueing to complete functional tasks, tends to veer to the left when walking with RW, limited secondary to fatigue and tolerated sitting up in chair after therapy.  Patient to be discharged back to ALF today and discharged from physical therapy to care of nursing for ambulation as tolerated for length of stay.     Follow Up Recommendations Home health PT;Supervision for mobility/OOB;Supervision - Intermittent    Equipment Recommendations  None recommended by PT    Recommendations for Other Services       Precautions / Restrictions Precautions Precautions: Fall Restrictions Weight Bearing Restrictions: No      Mobility  Bed Mobility Overal bed mobility: Needs Assistance Bed Mobility: Supine to Sit     Supine to sit: Min assist        Transfers Overall transfer level: Needs assistance Equipment used: Rolling walker (2 wheeled);None Transfers: Sit to/from [employer] to Stand: Min guard;Min assist Stand pivot transfers: Min guard;Min assist       General transfer comment:  slightly unsteady on feet, required RW for safety  Ambulation/Gait Ambulation/Gait assistance: Min assist Gait Distance (Feet): 75 Feet Assistive device: Rolling walker (2 wheeled) Gait Pattern/deviations: Decreased step length - right;Decreased step length - left;Decreased stride length Gait velocity: decreased   General Gait Details: slow slightly labored cadence with tendency to drift to the left, requires help to make turns with RW, no loss of balance, limited secondary to fatigue  Stairs            Wheelchair Mobility    Modified Rankin (Stroke Patients Only)       Balance Overall balance assessment: Needs assistance Sitting-balance support: Feet supported;No upper extremity supported Sitting balance-Leahy Scale: Good     Standing balance support: No upper extremity supported;During functional activity Standing balance-Leahy Scale: Poor Standing balance comment: fair/poor without AD, fair using RW                             Pertinent Vitals/Pain Pain Assessment: No/denies pain    Home Living Family/patient expects to be discharged to:: Assisted living               Home Equipment: Walker - 2 wheels      Prior Function Level of Independence: Needs assistance   Gait / Transfers Assistance Needed: Assisted household gait using RW  ADL's / Homemaking Assistance Needed: assisted by ALF staff        Hand Dominance        Extremity/Trunk Assessment   Upper Extremity Assessment Upper Extremity Assessment: Generalized weakness    Lower Extremity Assessment Lower Extremity Assessment: Generalized  weakness    Cervical / Trunk Assessment Cervical / Trunk Assessment: Kyphotic  Communication   Communication: No difficulties  Cognition Arousal/Alertness: Awake/alert Behavior During Therapy: WFL for tasks assessed/performed Overall Cognitive Status: History of cognitive impairments - at baseline                                  General Comments: requires repeated verbal/tactile cueing      General Comments      Exercises     Assessment/Plan    PT Assessment All further PT needs can be met in the next venue of care  PT Problem List Decreased strength;Decreased activity tolerance;Decreased balance;Decreased mobility       PT Treatment Interventions      PT Goals (Current goals can be found in the Care Plan section)  Acute Rehab PT Goals Patient Stated Goal: return home PT Goal Formulation: With patient Time For Goal Achievement: 01/04/19 Potential to Achieve Goals: Good    Frequency     Barriers to discharge        Co-evaluation               AM-PAC PT "6 Clicks" Mobility  Outcome Measure Help needed turning from your back to your side while in a flat bed without using bedrails?: A Little Help needed moving from lying on your back to sitting on the side of a flat bed without using bedrails?: A Little Help needed moving to and from a bed to a chair (including a wheelchair)?: A Little Help needed standing up from a chair using your arms (e.g., wheelchair or bedside chair)?: A Little Help needed to walk in hospital room?: A Little Help needed climbing 3-5 steps with a railing? : A Lot 6 Click Score: 17    End of Session   Activity Tolerance: Patient tolerated treatment well;Patient limited by fatigue Patient left: in chair;with call bell/phone within reach;with chair alarm set Nurse Communication: Mobility status PT Visit Diagnosis: Unsteadiness on feet (R26.81);Other abnormalities of gait and mobility (R26.89)    Time: 1219-7588 PT Time Calculation (min) (ACUTE ONLY): 31 min   Charges:   PT Evaluation $PT Eval Moderate Complexity: 1 Mod PT Treatments $Gait Training: 8-22 mins        1:54 PM, 01/04/19 Lonell Grandchild, MPT Physical Therapist with [facility] [phone] office [phone] mobile phone

## 2019-01-06 LAB — URINE CULTURE: Culture: >=100000 — AB

## 2019-01-27 ENCOUNTER — Encounter (HOSPITAL_COMMUNITY): Payer: Self-pay | Admitting: Emergency Medicine

## 2019-01-27 ENCOUNTER — Emergency Department (HOSPITAL_COMMUNITY)
Admission: EM | Admit: 2019-01-27 | Discharge: 2019-01-27 | Disposition: A | Discharge status: Skilled Nursing Facility | Payer: Medicare Other | Attending: Emergency Medicine | Admitting: Emergency Medicine

## 2019-01-27 ENCOUNTER — Other Ambulatory Visit: Payer: Self-pay

## 2019-01-27 ENCOUNTER — Emergency Department (HOSPITAL_COMMUNITY): Payer: Medicare Other

## 2019-01-27 DIAGNOSIS — I2510 Atherosclerotic heart disease of native coronary artery without angina pectoris: Secondary | ICD-10-CM | POA: Diagnosis not present

## 2019-01-27 DIAGNOSIS — I10 Essential (primary) hypertension: Secondary | ICD-10-CM | POA: Diagnosis not present

## 2019-01-27 DIAGNOSIS — J449 Chronic obstructive pulmonary disease, unspecified: Secondary | ICD-10-CM | POA: Diagnosis not present

## 2019-01-27 DIAGNOSIS — Z87891 Personal history of nicotine dependence: Secondary | ICD-10-CM | POA: Diagnosis not present

## 2019-01-27 DIAGNOSIS — F0390 Unspecified dementia without behavioral disturbance: Secondary | ICD-10-CM | POA: Diagnosis not present

## 2019-01-27 DIAGNOSIS — R531 Weakness: Secondary | ICD-10-CM | POA: Diagnosis present

## 2019-01-27 LAB — COMPREHENSIVE METABOLIC PANEL
ALT: 13 U/L (ref 0–44)
AST: 17 U/L (ref 15–41)
Albumin: 4 g/dL (ref 3.5–5.0)
Alkaline Phosphatase: 99 U/L (ref 38–126)
Anion gap: 9 (ref 5–15)
BUN: 21 mg/dL (ref 8–23)
CHLORIDE: 105 mmol/L (ref 98–111)
CO2: 26 mmol/L (ref 22–32)
Calcium: 9.7 mg/dL (ref 8.9–10.3)
Creatinine, Ser: 0.85 mg/dL (ref 0.44–1.00)
GFR calc Af Amer: >60 mL/min (ref >60)
GFR calc non Af Amer: >60 mL/min (ref >60)
Glucose, Bld: 98 mg/dL (ref 70–99)
POTASSIUM: 3.7 mmol/L (ref 3.5–5.1)
Sodium: 140 mmol/L (ref 135–145)
Total Bilirubin: 0.5 mg/dL (ref 0.3–1.2)
Total Protein: 7.6 g/dL (ref 6.5–8.1)

## 2019-01-27 LAB — URINALYSIS, ROUTINE W REFLEX MICROSCOPIC
BILIRUBIN URINE: NEGATIVE
Glucose, UA: NEGATIVE mg/dL
Hgb urine dipstick: NEGATIVE
Ketones, ur: NEGATIVE mg/dL
Leukocytes,Ua: NEGATIVE
Nitrite: NEGATIVE
Protein, ur: NEGATIVE mg/dL
Specific Gravity, Urine: 1.011 (ref 1.005–1.030)
pH: 7 (ref 5.0–8.0)

## 2019-01-27 LAB — CBC WITH DIFFERENTIAL/PLATELET
Abs Immature Granulocytes: 0.02 10*3/uL (ref 0.00–0.07)
Basophils Absolute: 0 10*3/uL (ref 0.0–0.1)
Basophils Relative: 1 %
Eosinophils Absolute: 0.2 10*3/uL (ref 0.0–0.5)
Eosinophils Relative: 2 %
HCT: 46.5 % — ABNORMAL HIGH (ref 36.0–46.0)
Hemoglobin: 14.2 g/dL (ref 12.0–15.0)
Immature Granulocytes: 0 %
LYMPHS PCT: 27 %
Lymphs Abs: 2.3 10*3/uL (ref 0.7–4.0)
MCH: 26.9 pg (ref 26.0–34.0)
MCHC: 30.5 g/dL (ref 30.0–36.0)
MCV: 88.1 fL (ref 80.0–100.0)
Monocytes Absolute: 0.7 10*3/uL (ref 0.1–1.0)
Monocytes Relative: 8 %
NEUTROS PCT: 62 %
Neutro Abs: 5.2 10*3/uL (ref 1.7–7.7)
Platelets: 237 10*3/uL (ref 150–400)
RBC: 5.28 MIL/uL — AB (ref 3.87–5.11)
RDW: 14.8 % (ref 11.5–15.5)
WBC: 8.4 10*3/uL (ref 4.0–10.5)
nRBC: 0 % (ref 0.0–0.2)

## 2019-01-27 LAB — TROPONIN I
Troponin I: 0.03 ng/mL (ref <0.03)
Troponin I: <0.03 ng/mL (ref <0.03)

## 2019-01-27 NOTE — ED Notes (Signed)
ED Provider at bedside. 
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
## 2019-01-27 NOTE — Discharge Instructions (Signed)
You were seen in the ED today with weakness. Your labs and CT scan were normal. We found no evidence of stroke. Consider having your PCP continue to consider your home medications and decide if any of those could cause your symptoms.   Return to the ED with any fever, chills, or other worsening symptoms.

## 2019-01-27 NOTE — ED Notes (Signed)
CRITICAL VALUE ALERT  Critical Value:  Troponin 0.03  Date & Time Notied:  01/27/19 0919  Provider Notified: Dr. Laverta [place]  Orders Received/Actions taken: EDP notified, no further orders given

## 2019-01-27 NOTE — ED Triage Notes (Signed)
Patient brought in via EMS from [place]. Patient alert. Hx of dementia. Patient brought in due to generalized weakness that started on Friday and has progressively gotten worse. Per [facility] Staff patient started leaning to right side this morning and isn't and talkative or active per her norm. CBG per paramedic was 116 and grips equal on both sides.

## 2019-01-28 ENCOUNTER — Encounter (HOSPITAL_COMMUNITY): Payer: Self-pay | Admitting: Emergency Medicine

## 2019-01-28 ENCOUNTER — Emergency Department (HOSPITAL_COMMUNITY): Payer: Medicare Other

## 2019-01-28 ENCOUNTER — Other Ambulatory Visit: Payer: Self-pay

## 2019-01-28 ENCOUNTER — Observation Stay (HOSPITAL_COMMUNITY)
Admission: EM | Admit: 2019-01-28 | Discharge: 2019-01-29 | Disposition: A | Discharge status: Skilled Nursing Facility | Payer: Medicare Other | Attending: Internal Medicine | Admitting: Internal Medicine

## 2019-01-28 DIAGNOSIS — Z8249 Family history of ischemic heart disease and other diseases of the circulatory system: Secondary | ICD-10-CM | POA: Diagnosis not present

## 2019-01-28 DIAGNOSIS — E860 Dehydration: Secondary | ICD-10-CM | POA: Insufficient documentation

## 2019-01-28 DIAGNOSIS — R404 Transient alteration of awareness: Secondary | ICD-10-CM

## 2019-01-28 DIAGNOSIS — N179 Acute kidney failure, unspecified: Principal | ICD-10-CM | POA: Insufficient documentation

## 2019-01-28 DIAGNOSIS — I444 Left anterior fascicular block: Secondary | ICD-10-CM | POA: Diagnosis not present

## 2019-01-28 DIAGNOSIS — E785 Hyperlipidemia, unspecified: Secondary | ICD-10-CM | POA: Diagnosis not present

## 2019-01-28 DIAGNOSIS — J449 Chronic obstructive pulmonary disease, unspecified: Secondary | ICD-10-CM | POA: Insufficient documentation

## 2019-01-28 DIAGNOSIS — I493 Ventricular premature depolarization: Secondary | ICD-10-CM | POA: Insufficient documentation

## 2019-01-28 DIAGNOSIS — Z66 Do not resuscitate: Secondary | ICD-10-CM | POA: Diagnosis not present

## 2019-01-28 DIAGNOSIS — Z87891 Personal history of nicotine dependence: Secondary | ICD-10-CM | POA: Insufficient documentation

## 2019-01-28 DIAGNOSIS — Z79899 Other long term (current) drug therapy: Secondary | ICD-10-CM | POA: Diagnosis not present

## 2019-01-28 DIAGNOSIS — K219 Gastro-esophageal reflux disease without esophagitis: Secondary | ICD-10-CM | POA: Diagnosis not present

## 2019-01-28 DIAGNOSIS — Z7982 Long term (current) use of aspirin: Secondary | ICD-10-CM | POA: Diagnosis not present

## 2019-01-28 DIAGNOSIS — I129 Hypertensive chronic kidney disease with stage 1 through stage 4 chronic kidney disease, or unspecified chronic kidney disease: Secondary | ICD-10-CM | POA: Diagnosis not present

## 2019-01-28 DIAGNOSIS — I959 Hypotension, unspecified: Secondary | ICD-10-CM | POA: Diagnosis not present

## 2019-01-28 DIAGNOSIS — I440 Atrioventricular block, first degree: Secondary | ICD-10-CM | POA: Diagnosis not present

## 2019-01-28 DIAGNOSIS — G309 Alzheimer's disease, unspecified: Secondary | ICD-10-CM | POA: Insufficient documentation

## 2019-01-28 DIAGNOSIS — G9341 Metabolic encephalopathy: Secondary | ICD-10-CM | POA: Insufficient documentation

## 2019-01-28 DIAGNOSIS — I10 Essential (primary) hypertension: Secondary | ICD-10-CM

## 2019-01-28 LAB — COMPREHENSIVE METABOLIC PANEL
ALT: 15 U/L (ref 0–44)
AST: 19 U/L (ref 15–41)
Albumin: 4.1 g/dL (ref 3.5–5.0)
Alkaline Phosphatase: 102 U/L (ref 38–126)
Anion gap: 12 (ref 5–15)
BUN: 36 mg/dL — AB (ref 8–23)
CO2: 23 mmol/L (ref 22–32)
CREATININE: 1.7 mg/dL — AB (ref 0.44–1.00)
Calcium: 10.2 mg/dL (ref 8.9–10.3)
Chloride: 103 mmol/L (ref 98–111)
GFR calc Af Amer: 33 mL/min — ABNORMAL LOW (ref >60)
GFR, EST NON AFRICAN AMERICAN: 29 mL/min — AB (ref >60)
Glucose, Bld: 111 mg/dL — ABNORMAL HIGH (ref 70–99)
Potassium: 3.8 mmol/L (ref 3.5–5.1)
Sodium: 138 mmol/L (ref 135–145)
Total Bilirubin: 0.7 mg/dL (ref 0.3–1.2)
Total Protein: 7.8 g/dL (ref 6.5–8.1)

## 2019-01-28 LAB — CBC WITH DIFFERENTIAL/PLATELET
Abs Immature Granulocytes: 0.04 10*3/uL (ref 0.00–0.07)
Basophils Absolute: 0 10*3/uL (ref 0.0–0.1)
Basophils Relative: 0 %
Eosinophils Absolute: 0.1 10*3/uL (ref 0.0–0.5)
Eosinophils Relative: 1 %
HCT: 50.2 % — ABNORMAL HIGH (ref 36.0–46.0)
Hemoglobin: 15.1 g/dL — ABNORMAL HIGH (ref 12.0–15.0)
Immature Granulocytes: 1 %
Lymphocytes Relative: 22 %
Lymphs Abs: 1.8 10*3/uL (ref 0.7–4.0)
MCH: 26.6 pg (ref 26.0–34.0)
MCHC: 30.1 g/dL (ref 30.0–36.0)
MCV: 88.4 fL (ref 80.0–100.0)
MONOS PCT: 6 %
Monocytes Absolute: 0.5 10*3/uL (ref 0.1–1.0)
NRBC: 0 % (ref 0.0–0.2)
Neutro Abs: 5.5 10*3/uL (ref 1.7–7.7)
Neutrophils Relative %: 70 %
Platelets: 217 10*3/uL (ref 150–400)
RBC: 5.68 MIL/uL — ABNORMAL HIGH (ref 3.87–5.11)
RDW: 14.8 % (ref 11.5–15.5)
WBC: 8 10*3/uL (ref 4.0–10.5)

## 2019-01-28 LAB — LACTIC ACID, PLASMA
Lactic Acid, Venous: 1.5 mmol/L (ref 0.5–1.9)
Lactic Acid, Venous: 0.8 mmol/L (ref 0.5–1.9)

## 2019-01-28 LAB — TROPONIN I: Troponin I: <0.03 ng/mL (ref <0.03)

## 2019-01-28 LAB — CBG MONITORING, ED: Glucose-Capillary: 117 mg/dL — ABNORMAL HIGH (ref 70–99)

## 2019-01-28 MED ORDER — ACETAMINOPHEN 325 MG PO TABS: 650.0000 mg | ORAL_TABLET | Freq: Four times a day (QID) | ORAL | Status: DC | PRN

## 2019-01-28 MED ORDER — PRAVASTATIN SODIUM 40 MG PO TABS
40.0000 mg | ORAL_TABLET | Freq: Every evening | ORAL | Status: DC
  Administered 2019-01-28: 40 mg via ORAL
  Filled 2019-01-28: qty 1

## 2019-01-28 MED ORDER — DONEPEZIL HCL 5 MG PO TABS
10.0000 mg | ORAL_TABLET | Freq: Every day | ORAL | Status: DC
  Administered 2019-01-29: 10 mg via ORAL
  Filled 2019-01-28: qty 2

## 2019-01-28 MED ORDER — ASPIRIN EC 81 MG PO TBEC
81.0000 mg | DELAYED_RELEASE_TABLET | Freq: Every day | ORAL | Status: DC
  Administered 2019-01-29: 81 mg via ORAL
  Filled 2019-01-28: qty 1

## 2019-01-28 MED ORDER — ONDANSETRON HCL 4 MG/2ML IJ SOLN: 4.0000 mg | Freq: Four times a day (QID) | INTRAMUSCULAR | Status: DC | PRN

## 2019-01-28 MED ORDER — ACETAMINOPHEN 650 MG RE SUPP: 650.0000 mg | Freq: Four times a day (QID) | RECTAL | Status: DC | PRN

## 2019-01-28 MED ORDER — MEMANTINE HCL 10 MG PO TABS
10.0000 mg | ORAL_TABLET | Freq: Two times a day (BID) | ORAL | Status: DC
  Administered 2019-01-28: 10 mg via ORAL
  Filled 2019-01-28: qty 1

## 2019-01-28 MED ORDER — QUETIAPINE FUMARATE 100 MG PO TABS: 100.0000 mg | ORAL_TABLET | Freq: Every day | ORAL | Status: DC

## 2019-01-28 MED ORDER — SODIUM CHLORIDE 0.9 % IV BOLUS
1000.0000 mL | Freq: Once | INTRAVENOUS | Status: AC
  Administered 2019-01-28: 1000 mL via INTRAVENOUS

## 2019-01-28 MED ORDER — ALBUTEROL SULFATE (2.5 MG/3ML) 0.083% IN NEBU: 2.5000 mg | INHALATION_SOLUTION | RESPIRATORY_TRACT | Status: DC | PRN

## 2019-01-28 MED ORDER — ONDANSETRON HCL 4 MG PO TABS: 4.0000 mg | ORAL_TABLET | Freq: Four times a day (QID) | ORAL | Status: DC | PRN

## 2019-01-28 MED ORDER — LACTATED RINGERS IV SOLN
INTRAVENOUS | Status: DC
  Administered 2019-01-28: 21:00:00 via INTRAVENOUS

## 2019-01-28 NOTE — ED Triage Notes (Signed)
Pt here from [place] for evaluation of altered mental status and possible syncopal episode.  EMS states pt was hypotensive with SBP in 90s on their arrival which improved with fluids.  Pt here yesterday for similar.
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
## 2019-01-28 NOTE — Progress Notes (Signed)
Brief note regarding plan, with full H&P to follow:   Melissa Baird is a 78 y.o. female with medical history significant for Alzheimer dementia, hypertension, who is admitted to [facility] on 01/28/2019 with acute kidney injury after presenting from [place] to [facility] ED for further evaluation of increased somnolence and confusion.    #) Acute kidney injury: Presenting labs this evening reflect creatinine of 1.85 which is doubled over the last 24 hours relative to values of 0.85 when checked on 01/27/2019.  This is also relative to historical baseline creatinine of 0.8-0.9.  Suspect prerenal contributions in the setting of dehydration given report of patient's recently diminished oral intake.  Of note, urinalysis is currently pending at this time.  Plan: Maintenance lactated Ringer's overnight, with repeat BMP in the morning.  Monitor strict I's and O's and attempt to avoid nephrotoxic agents.  Hold home lisinopril.  Will follow for result of urinalysis, with close attention for the presence of urinary cast.  I have ordered random urine sodium as well as random urine creatinine.    #) Acute metabolic encephalopathy: Per patient's daughter, who is present at bedside, the patient has exhibited increased somnolence as well as increased confusion relative to baseline, in which she has a history of Alzheimer's dementia.  Suspect contributions from dehydration as well as ensuing acute kidney injury, associated with secondary effects of increase in circulating concentration of home medications with renal metabolism/clearance.  No evidence of concomitant underlying infectious process, although urinalysis is currently pending at this time.  In the context of a documented history of COPD, will also check a blood gas to evaluate for any contribution from hypercapnic encephalopathy.  Of note, the patient's daughter denies any recent changes to patient's home medication regimen, with the  exception of a decrease in dose of multiple of her cardiac medications over the course of the last month due to concern for episodes of hypotension.  Plan: Work-up and management of presenting acute kidney injury, including gentle IV fluids as above.  Repeat CMP as well as CBC in the morning.  Check TSH, ammonia, and VBG.  We will also check urinary drug screen.  Will monitor for results of urinalysis.  Will attempt to hold multiple of patient's central acting medications, including that of Seroquel and Wellbutrin.  We will also hold Protonix due to potential encephalopathic side effects relevant to this medication.  I have placed an nursing communication order for nursing bedside swallow evaluation, with the patient to remain n.p.o. until she has passed this evaluation.    Babs Bertin, DO Hospitalist
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
## 2019-01-29 DIAGNOSIS — E860 Dehydration: Secondary | ICD-10-CM | POA: Diagnosis not present

## 2019-01-29 DIAGNOSIS — G9341 Metabolic encephalopathy: Secondary | ICD-10-CM | POA: Diagnosis not present

## 2019-01-29 DIAGNOSIS — N179 Acute kidney failure, unspecified: Secondary | ICD-10-CM | POA: Diagnosis not present

## 2019-01-29 DIAGNOSIS — I10 Essential (primary) hypertension: Secondary | ICD-10-CM | POA: Diagnosis not present

## 2019-01-29 LAB — CBC
HEMATOCRIT: 42.7 % (ref 36.0–46.0)
Hemoglobin: 12.9 g/dL (ref 12.0–15.0)
MCH: 26.7 pg (ref 26.0–34.0)
MCHC: 30.2 g/dL (ref 30.0–36.0)
MCV: 88.4 fL (ref 80.0–100.0)
Platelets: 140 10*3/uL — ABNORMAL LOW (ref 150–400)
RBC: 4.83 MIL/uL (ref 3.87–5.11)
RDW: 14.6 % (ref 11.5–15.5)
WBC: 6.9 10*3/uL (ref 4.0–10.5)
nRBC: 0 % (ref 0.0–0.2)

## 2019-01-29 LAB — COMPREHENSIVE METABOLIC PANEL
ALBUMIN: 3.4 g/dL — AB (ref 3.5–5.0)
ALT: 13 U/L (ref 0–44)
AST: 16 U/L (ref 15–41)
Alkaline Phosphatase: 79 U/L (ref 38–126)
Anion gap: 6 (ref 5–15)
BUN: 34 mg/dL — AB (ref 8–23)
CHLORIDE: 109 mmol/L (ref 98–111)
CO2: 23 mmol/L (ref 22–32)
Calcium: 9 mg/dL (ref 8.9–10.3)
Creatinine, Ser: 1.17 mg/dL — ABNORMAL HIGH (ref 0.44–1.00)
GFR calc Af Amer: 52 mL/min — ABNORMAL LOW (ref >60)
GFR calc non Af Amer: 45 mL/min — ABNORMAL LOW (ref >60)
Glucose, Bld: 95 mg/dL (ref 70–99)
Potassium: 3.7 mmol/L (ref 3.5–5.1)
Sodium: 138 mmol/L (ref 135–145)
Total Bilirubin: 0.5 mg/dL (ref 0.3–1.2)
Total Protein: 6.2 g/dL — ABNORMAL LOW (ref 6.5–8.1)

## 2019-01-29 LAB — URINE CULTURE
CULTURE: NO GROWTH
Special Requests: NORMAL

## 2019-01-29 LAB — BLOOD GAS, VENOUS
ACID-BASE DEFICIT: 0.5 mmol/L (ref 0.0–2.0)
Bicarbonate: 23.3 mmol/L (ref 20.0–28.0)
FIO2: 21
O2 SAT: 75.5 %
Patient temperature: 37
pCO2, Ven: 42.7 mmHg — ABNORMAL LOW (ref 44.0–60.0)
pH, Ven: 7.369 (ref 7.250–7.430)
pO2, Ven: 43.3 mmHg (ref 32.0–45.0)

## 2019-01-29 LAB — MAGNESIUM: Magnesium: 2.2 mg/dL (ref 1.7–2.4)

## 2019-01-29 LAB — AMMONIA: Ammonia: <9 umol/L — ABNORMAL LOW (ref 9–35)

## 2019-01-29 LAB — TSH: TSH: 0.733 u[IU]/mL (ref 0.350–4.500)

## 2019-01-29 LAB — MRSA PCR SCREENING: MRSA by PCR: NEGATIVE

## 2019-01-29 MED ORDER — POTASSIUM CHLORIDE CRYS ER 20 MEQ PO TBCR
40.0000 meq | EXTENDED_RELEASE_TABLET | Freq: Once | ORAL | Status: AC
  Administered 2019-01-29: 40 meq via ORAL
  Filled 2019-01-29: qty 2

## 2019-01-29 MED ORDER — SODIUM CHLORIDE 0.45 % IV BOLUS
1000.0000 mL | Freq: Once | INTRAVENOUS | Status: AC
  Administered 2019-01-29: 1000 mL via INTRAVENOUS

## 2019-01-29 MED ORDER — AMLODIPINE BESYLATE 5 MG PO TABS: 5.0000 mg | ORAL_TABLET | Freq: Every day | ORAL | Status: AC

## 2019-01-29 NOTE — NC FL2 (Signed)
[place] LEVEL OF CARE SCREENING TOOL     IDENTIFICATION  Patient Name: Melissa Baird Birthdate: Nov 28, 1941 Sex: female Admission Date (Current Location): 01/28/2019  [facility] and [place] Number:  [employer] and Address:  [place] [address] [place]      Provider Number: [id number]  Attending Physician Name and Address:  Kathie Dike, MD  Relative Name and Phone Number:       Current Level of Care: Hospital Recommended Level of Care: [place] Prior Approval Number:    Date Approved/Denied:   PASRR Number:    Discharge Plan: Other (Comment)(ALF)    Current Diagnoses: Patient Active Problem List   Diagnosis Date Noted  . AKI (acute kidney injury) ([place]) 01/28/2019  . Acute metabolic encephalopathy 54/27/0623  . Dehydration 01/28/2019  . GERD (gastroesophageal reflux disease) 01/03/2019  . 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 Pressure injury of skin 01/25/2018  . Sepsis ([place]) 01/24/2018  . UTI (urinary tract infection) 01/24/2018  . Tachycardia 01/24/2018  . Influenza 12/30/2016  . Hypoxia 12/30/2016  . Loss of consciousness ([place]) 10/12/2016  . Chest pain 02/23/2016  . Dementia ([place])   . Unstable angina ([place]) 06/17/2013  . HLD (hyperlipidemia) 06/17/2013  . TOBACCO ABUSE 09/30/2010  . 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 [place] DISEASE, LUMBAR 09/14/2010  . Back pain, chronic 09/14/2010  . Depression 09/03/2010  . Essential hypertension 09/03/2010  . SYNCOPE 09/03/2010    Orientation RESPIRATION BLADDER Height & Weight     Self  Normal Incontinent Weight: 128 lb 1.4 oz (58.1 kg) Height:  5\' 6"  (167.6 cm)  BEHAVIORAL SYMPTOMS/MOOD NEUROLOGICAL BOWEL NUTRITION STATUS      Incontinent Diet(regular, no added table salt)  AMBULATORY STATUS COMMUNICATION OF NEEDS Skin   Limited Assist Verbally Normal                        Personal Care Assistance Level of Assistance  Bathing, Feeding, Dressing Bathing Assistance: Maximum assistance Feeding assistance: Maximum assistance Dressing Assistance: Maximum assistance     Functional Limitations Info  Sight, Hearing, Speech Sight Info: Impaired(glasses) Hearing Info: Adequate Speech Info: Adequate    SPECIAL CARE FACTORS FREQUENCY                       Contractures Contractures Info: Not present    Additional Factors Info  Code Status, Allergies, Psychotropic Code Status Info: DNR Allergies Info: NKA Psychotropic Info: Wellbutrin, Seroquel         Current Medications (01/29/2019):  This is the current hospital active medication list Current Facility-Administered Medications  Medication Dose Route Frequency Provider Last Rate Last Dose  . acetaminophen (TYLENOL) tablet 650 mg  650 mg Oral Q6H PRN Howerter, Justin B, DO       Or  . acetaminophen (TYLENOL) suppository 650 mg  650 mg Rectal Q6H PRN Howerter, Justin B, DO      . albuterol (PROVENTIL) (2.5 MG/3ML) 0.083% nebulizer solution 2.5 mg  2.5 mg Nebulization Q4H PRN Howerter, Justin B, DO      . aspirin EC tablet 81 mg  81 mg Oral Daily Howerter, Justin B, DO   81 mg at 01/29/19 1000  . donepezil (ARICEPT) tablet 10 mg  10 mg Oral Daily Howerter, Justin B, DO   10 mg at 01/29/19 1000  . lactated ringers infusion  Intravenous Continuous Howerter, Justin B, DO 50 mL/hr at 01/28/19 2119    . ondansetron (ZOFRAN) tablet 4 mg  4 mg Oral Q6H PRN Howerter, Justin B, DO       Or  . ondansetron (ZOFRAN) injection 4 mg  4 mg Intravenous Q6H PRN Howerter, Justin B, DO      . pravastatin (PRAVACHOL) tablet 40 mg  40 mg Oral QPM Howerter, Justin B, DO   40 mg at 01/28/19 2255  . sodium chloride 0.45 % bolus 1,000 mL  1,000 mL Intravenous Once Kathie Dike, MD 983.6 mL/hr at 01/29/19 1050 1,000 mL at 01/29/19 1050     Discharge Medications:  STOP taking these medications   lisinopril 10 MG  tablet Commonly known as:  PRINIVIL,ZESTRIL     TAKE these medications   acetaminophen 500 MG tablet Commonly known as:  TYLENOL Take 500 mg by mouth every 6 (six) hours as needed for mild pain or moderate pain.   alendronate 70 MG tablet Commonly known as:  FOSAMAX Take 70 mg by mouth every Monday. Take with a full glass of water on an empty stomach.   amLODipine 5 MG tablet Commonly known as:  NORVASC Take 1 tablet (5 mg total) by mouth daily. What changed:  how much to take   aspirin EC 81 MG tablet Take 81 mg by mouth daily.   buPROPion 300 MG 24 hr tablet Commonly known as:  WELLBUTRIN XL Take 300 mg by mouth daily.   calcium citrate 950 MG tablet Commonly known as:  CALCITRATE - dosed in mg elemental calcium Take 2 tablets by mouth 2 (two) times daily.   docusate sodium 100 MG capsule Commonly known as:  COLACE Take 100 mg by mouth 2 (two) times daily.   donepezil 10 MG tablet Commonly known as:  ARICEPT Take 10 mg by mouth daily.   isosorbide mononitrate 30 MG 24 hr tablet Commonly known as:  IMDUR Take 2 tablets (60 mg total) by mouth daily.   memantine 10 MG tablet Commonly known as:  NAMENDA Take 10 mg by mouth 2 (two) times daily.   nitroGLYCERIN 0.4 MG SL tablet Commonly known as:  NITROSTAT Place 1 tablet (0.4 mg total) under the tongue every 5 (five) minutes as needed for chest pain.   pantoprazole 40 MG tablet Commonly known as:  PROTONIX Take 40 mg by mouth daily.   pravastatin 40 MG tablet Commonly known as:  PRAVACHOL Take 40 mg by mouth every evening.   QUEtiapine 100 MG tablet Commonly known as:  SEROQUEL Take 100 mg by mouth at bedtime.   vitamin B-12 1000 MCG tablet Commonly known as:  CYANOCOBALAMIN Take 1 tablet (1,000 mcg total) by mouth 2 (two) times daily.   Vitamin D3 125 MCG (5000 UT) Caps Take 1 capsule by mouth daily.     Relevant Imaging Results:  Relevant Lab Results:   Additional  Information    Shade Flood, LCSW

## 2019-01-29 NOTE — Progress Notes (Signed)
Patient move from ED stretcher to bed and is now resting in no apparent distress with RR and effort WDP. Family currently in waiting room Patient denies pain at this time. -stable vitals noted

## 2019-01-29 NOTE — Care Management Obs Status (Addendum)
[place] NOTIFICATION   Patient Details  Name: Melissa Baird MRN: [id number] Date of Birth: 1941-09-14   Medicare Observation Status Notification Given:  Yes (discussed with daughter, Leda Gauze)    Antrone [place], Chauncey Reading, RN 01/29/2019, 11:56 AM
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
## 2019-01-29 NOTE — Clinical Social Work Note (Signed)
Pt discussed in [facility] today. Per MD, pt stable for dc. Pt resides at [facility]. Pt has dementia. Spoke with pt's daughter in the room to update on dc. She is agreeable. Spoke with Tammy at [facility] to update. DC clinical will be sent electronically. [facility] staff will transport pt at about 1:00. Updated pt's RN. There are no other CSW needs for dc.

## 2019-01-29 NOTE — NC FL2 (Deleted)
[place] LEVEL OF CARE SCREENING TOOL     IDENTIFICATION  Patient Name: Melissa Baird Birthdate: Aug 18, 1941 Sex: female Admission Date (Current Location): 01/28/2019  [facility] and [place] Number:  [employer] and Address:  [place] [address], [place]      Provider Number: [id number]  Attending Physician Name and Address:  Kathie Dike, MD  Relative Name and Phone Number:       Current Level of Care: Hospital Recommended Level of Care: [place] Prior Approval Number:    Date Approved/Denied:   PASRR Number:    Discharge Plan: Other (Comment)(ALF)    Current Diagnoses: Patient Active Problem List   Diagnosis Date Noted  . AKI (acute kidney injury) ([place]) 01/28/2019  . Acute metabolic encephalopathy 56/31/4970  . Dehydration 01/28/2019  . GERD (gastroesophageal reflux disease) 01/03/2019  . Acute renal failure superimposed on stage 3 chronic kidney disease ([place]) 01/03/2019  . Pressure injury of skin 01/25/2018  . Sepsis ([place]) 01/24/2018  . UTI (urinary tract infection) 01/24/2018  . Tachycardia 01/24/2018  . Influenza 12/30/2016  . Hypoxia 12/30/2016  . Loss of consciousness ([place]) 10/12/2016  . Chest pain 02/23/2016  . Dementia ([place])   . Unstable angina ([place]) 06/17/2013  . HLD (hyperlipidemia) 06/17/2013  . TOBACCO ABUSE 09/30/2010  . 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 [place] DISEASE, LUMBAR 09/14/2010  . Back pain, chronic 09/14/2010  . Depression 09/03/2010  . Essential hypertension 09/03/2010  . SYNCOPE 09/03/2010    Orientation RESPIRATION BLADDER Height & Weight     Self  Normal Incontinent Weight: 128 lb 1.4 oz (58.1 kg) Height:  5\' 6"  (167.6 cm)  BEHAVIORAL SYMPTOMS/MOOD NEUROLOGICAL BOWEL NUTRITION STATUS      Incontinent Diet(regular, no added table salt)  AMBULATORY STATUS COMMUNICATION OF NEEDS Skin   Limited Assist Verbally Normal                      Personal Care Assistance Level of Assistance  Bathing, Feeding, Dressing Bathing Assistance: Maximum assistance Feeding assistance: Maximum assistance Dressing Assistance: Maximum assistance     Functional Limitations Info  Sight, Hearing, Speech Sight Info: Impaired(glasses) Hearing Info: Adequate Speech Info: Adequate    SPECIAL CARE FACTORS FREQUENCY                       Contractures Contractures Info: Not present    Additional Factors Info  Code Status, Allergies, Psychotropic Code Status Info: DNR Allergies Info: NKA Psychotropic Info: Wellbutrin, Seroquel         Current Medications (01/29/2019):  This is the current hospital active medication list Current Facility-Administered Medications  Medication Dose Route Frequency Provider Last Rate Last Dose  . acetaminophen (TYLENOL) tablet 650 mg  650 mg Oral Q6H PRN Howerter, Justin B, DO       Or  . acetaminophen (TYLENOL) suppository 650 mg  650 mg Rectal Q6H PRN Howerter, Justin B, DO      . albuterol (PROVENTIL) (2.5 MG/3ML) 0.083% nebulizer solution 2.5 mg  2.5 mg Nebulization Q4H PRN Howerter, Justin B, DO      . aspirin EC tablet 81 mg  81 mg Oral Daily Howerter, Justin B, DO   81 mg at 01/29/19 1000  . donepezil (ARICEPT) tablet 10 mg  10 mg Oral Daily Howerter, Justin B, DO   10 mg at 01/29/19 1000  . lactated ringers infusion   Intravenous  Continuous Howerter, Justin B, DO 50 mL/hr at 01/28/19 2119    . ondansetron (ZOFRAN) tablet 4 mg  4 mg Oral Q6H PRN Howerter, Justin B, DO       Or  . ondansetron (ZOFRAN) injection 4 mg  4 mg Intravenous Q6H PRN Howerter, Justin B, DO      . pravastatin (PRAVACHOL) tablet 40 mg  40 mg Oral QPM Howerter, Justin B, DO   40 mg at 01/28/19 2255  . sodium chloride 0.45 % bolus 1,000 mL  1,000 mL Intravenous Once Kathie Dike, MD 983.6 mL/hr at 01/29/19 1050 1,000 mL at 01/29/19 1050     Discharge Medications: Please see discharge summary for a list of  discharge medications.  Relevant Imaging Results:  Relevant Lab Results:   Additional Information    Shade Flood, LCSW

## 2019-01-29 NOTE — Progress Notes (Signed)
Family (daughter and female companion) at the bedside to see the patient. -patient resting and the family reports they will not stay long Daughter reports being Healthcare POA and has the paperwork to verify. -reports should be in the chart and the paperwork was initiated at [place]: Kalman Shan

## 2019-01-29 NOTE — Care Management CC44 (Signed)
Condition Code 44 Documentation Completed  Patient Details  Name: WINNIFRED DUFFORD MRN: [id number] Date of Birth: 29-Jun-1941   Condition Code 44 given:  Yes Patient signature on Condition Code 44 notice:  Yes Documentation of 2 MD's agreement:  Yes Code 44 added to claim:  Yes    Shelbe Haglund, Chauncey Reading, RN 01/29/2019, 11:56 AM

## 2019-02-02 LAB — CULTURE, BLOOD (ROUTINE X 2)
CULTURE: NO GROWTH
Culture: NO GROWTH
Special Requests: ADEQUATE
Special Requests: ADEQUATE

## 2019-02-05 ENCOUNTER — Other Ambulatory Visit: Payer: Self-pay

## 2019-02-05 ENCOUNTER — Encounter (HOSPITAL_COMMUNITY): Payer: Self-pay | Admitting: *Deleted

## 2019-02-05 ENCOUNTER — Emergency Department (HOSPITAL_COMMUNITY): Payer: Medicare Other

## 2019-02-05 ENCOUNTER — Emergency Department (HOSPITAL_COMMUNITY)
Admission: EM | Admit: 2019-02-05 | Discharge: 2019-02-05 | Disposition: A | Discharge status: Home / Self Care | Payer: Medicare Other | Attending: Emergency Medicine | Admitting: Emergency Medicine

## 2019-02-05 DIAGNOSIS — Y92129 Unspecified place in nursing home as the place of occurrence of the external cause: Secondary | ICD-10-CM | POA: Diagnosis not present

## 2019-02-05 DIAGNOSIS — Y9389 Activity, other specified: Secondary | ICD-10-CM | POA: Diagnosis not present

## 2019-02-05 DIAGNOSIS — Z87891 Personal history of nicotine dependence: Secondary | ICD-10-CM | POA: Diagnosis not present

## 2019-02-05 DIAGNOSIS — Z79899 Other long term (current) drug therapy: Secondary | ICD-10-CM | POA: Insufficient documentation

## 2019-02-05 DIAGNOSIS — F419 Anxiety disorder, unspecified: Secondary | ICD-10-CM | POA: Diagnosis not present

## 2019-02-05 DIAGNOSIS — S0993XA Unspecified injury of face, initial encounter: Secondary | ICD-10-CM | POA: Diagnosis present

## 2019-02-05 DIAGNOSIS — G309 Alzheimer's disease, unspecified: Secondary | ICD-10-CM | POA: Insufficient documentation

## 2019-02-05 DIAGNOSIS — F0280 Dementia in other diseases classified elsewhere without behavioral disturbance: Secondary | ICD-10-CM | POA: Insufficient documentation

## 2019-02-05 DIAGNOSIS — S0083XA Contusion of other part of head, initial encounter: Secondary | ICD-10-CM | POA: Insufficient documentation

## 2019-02-05 DIAGNOSIS — Y998 Other external cause status: Secondary | ICD-10-CM | POA: Diagnosis not present

## 2019-02-05 DIAGNOSIS — Z7982 Long term (current) use of aspirin: Secondary | ICD-10-CM | POA: Diagnosis not present

## 2019-02-05 DIAGNOSIS — I2510 Atherosclerotic heart disease of native coronary artery without angina pectoris: Secondary | ICD-10-CM | POA: Diagnosis not present

## 2019-02-05 DIAGNOSIS — W0110XA Fall on same level from slipping, tripping and stumbling with subsequent striking against unspecified object, initial encounter: Secondary | ICD-10-CM | POA: Diagnosis not present

## 2019-02-05 DIAGNOSIS — Z9049 Acquired absence of other specified parts of digestive tract: Secondary | ICD-10-CM | POA: Insufficient documentation

## 2019-02-05 DIAGNOSIS — F329 Major depressive disorder, single episode, unspecified: Secondary | ICD-10-CM | POA: Diagnosis not present

## 2019-02-05 DIAGNOSIS — J449 Chronic obstructive pulmonary disease, unspecified: Secondary | ICD-10-CM | POA: Insufficient documentation

## 2019-02-05 DIAGNOSIS — W19XXXA Unspecified fall, initial encounter: Secondary | ICD-10-CM

## 2019-02-05 NOTE — ED Notes (Signed)
Pt ambulated in hall with walker with assistance.  Pt denies any pain or dizziness
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
## 2019-02-05 NOTE — ED Notes (Signed)
ED Provider at bedside. 

## 2019-05-18 ENCOUNTER — Encounter (HOSPITAL_COMMUNITY): Payer: Self-pay | Admitting: Emergency Medicine

## 2019-05-18 ENCOUNTER — Other Ambulatory Visit: Payer: Self-pay

## 2019-05-18 ENCOUNTER — Emergency Department (HOSPITAL_COMMUNITY)
Admission: EM | Admit: 2019-05-18 | Discharge: 2019-05-18 | Disposition: A | Discharge status: Home / Self Care | Payer: Medicare Other | Attending: Emergency Medicine | Admitting: Emergency Medicine

## 2019-05-18 ENCOUNTER — Emergency Department (HOSPITAL_COMMUNITY): Payer: Medicare Other

## 2019-05-18 DIAGNOSIS — Y999 Unspecified external cause status: Secondary | ICD-10-CM | POA: Diagnosis not present

## 2019-05-18 DIAGNOSIS — I25110 Atherosclerotic heart disease of native coronary artery with unstable angina pectoris: Secondary | ICD-10-CM | POA: Diagnosis not present

## 2019-05-18 DIAGNOSIS — Z79899 Other long term (current) drug therapy: Secondary | ICD-10-CM | POA: Diagnosis not present

## 2019-05-18 DIAGNOSIS — S0181XA Laceration without foreign body of other part of head, initial encounter: Secondary | ICD-10-CM | POA: Insufficient documentation

## 2019-05-18 DIAGNOSIS — G309 Alzheimer's disease, unspecified: Secondary | ICD-10-CM | POA: Diagnosis not present

## 2019-05-18 DIAGNOSIS — I129 Hypertensive chronic kidney disease with stage 1 through stage 4 chronic kidney disease, or unspecified chronic kidney disease: Secondary | ICD-10-CM | POA: Diagnosis not present

## 2019-05-18 DIAGNOSIS — N183 Chronic kidney disease, stage 3 (moderate): Secondary | ICD-10-CM | POA: Insufficient documentation

## 2019-05-18 DIAGNOSIS — Y939 Activity, unspecified: Secondary | ICD-10-CM | POA: Diagnosis not present

## 2019-05-18 DIAGNOSIS — F0280 Dementia in other diseases classified elsewhere without behavioral disturbance: Secondary | ICD-10-CM | POA: Diagnosis not present

## 2019-05-18 DIAGNOSIS — Y929 Unspecified place or not applicable: Secondary | ICD-10-CM | POA: Insufficient documentation

## 2019-05-18 DIAGNOSIS — W050XXA Fall from non-moving wheelchair, initial encounter: Secondary | ICD-10-CM | POA: Insufficient documentation

## 2019-05-18 DIAGNOSIS — Z87891 Personal history of nicotine dependence: Secondary | ICD-10-CM | POA: Insufficient documentation

## 2019-05-18 DIAGNOSIS — J449 Chronic obstructive pulmonary disease, unspecified: Secondary | ICD-10-CM | POA: Insufficient documentation

## 2019-05-18 DIAGNOSIS — W19XXXA Unspecified fall, initial encounter: Secondary | ICD-10-CM

## 2019-05-18 DIAGNOSIS — F0391 Unspecified dementia with behavioral disturbance: Secondary | ICD-10-CM

## 2019-05-18 NOTE — ED Triage Notes (Signed)
Pt fell from wheelchair and hit right side of head on ground. Small lac above right eye with bleeding controlled.

## 2019-05-18 NOTE — ED Notes (Signed)
Pt returned from CT °

## 2019-05-18 NOTE — ED Notes (Signed)
Discharge instructions reviewed with care taker via telephone.

## 2019-05-18 NOTE — ED Notes (Signed)
Patient transported to CT 

## 2019-05-18 NOTE — Discharge Instructions (Addendum)
CT scan of head shows no acute injuries.  Steri-Strips to facial wound.
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
## 2019-09-22 IMAGING — CT CT HEAD W/O CM
3 of 6 series · 14 of 47 positions shown, 17 images · non-contrast
Comparison: January 03, 2019

CLINICAL DATA: Altered mental status. History of underlying
dementia.

EXAM:
CT HEAD WITHOUT CONTRAST
TECHNIQUE: Contiguous axial images were obtained from the base of the skull
through the vertex without intravenous contrast.

[Series 2: head wo · axial · 0.46mm/px · z∈[-7,+128]mm · 9 of 33 slices shown, 12 images]
[im 4/33  brain]
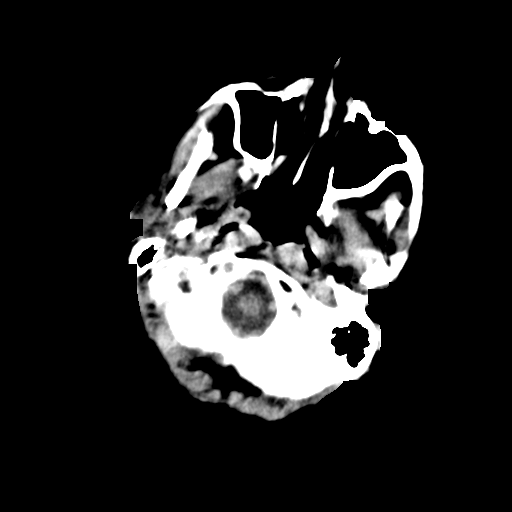
[im 4/33  bone]
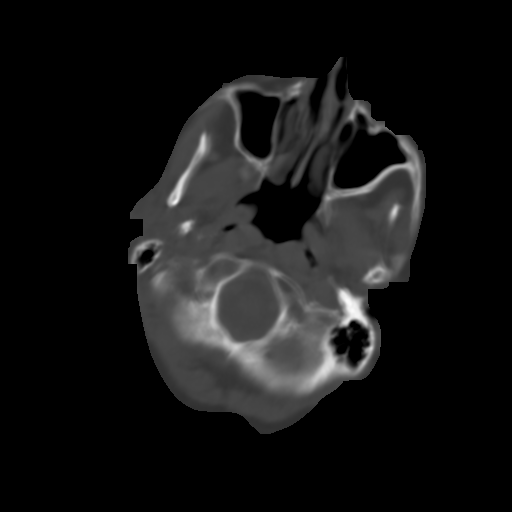
[im 7/33  brain]
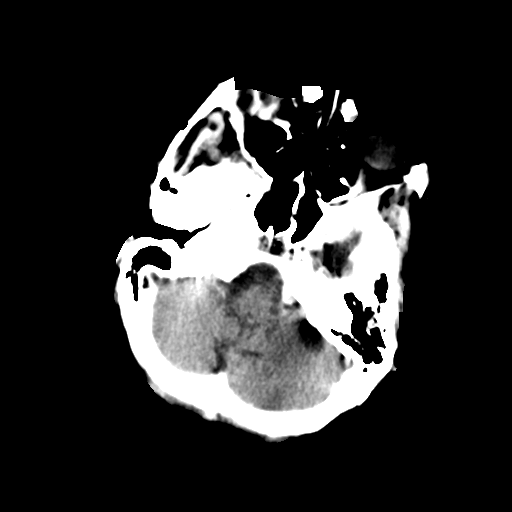
[im 11/33  brain]
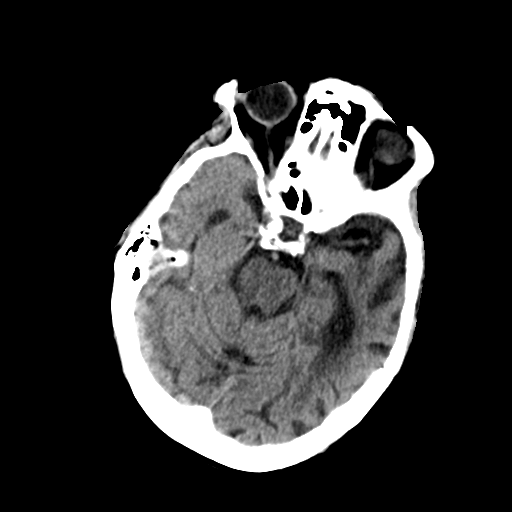
[im 14/33  brain]
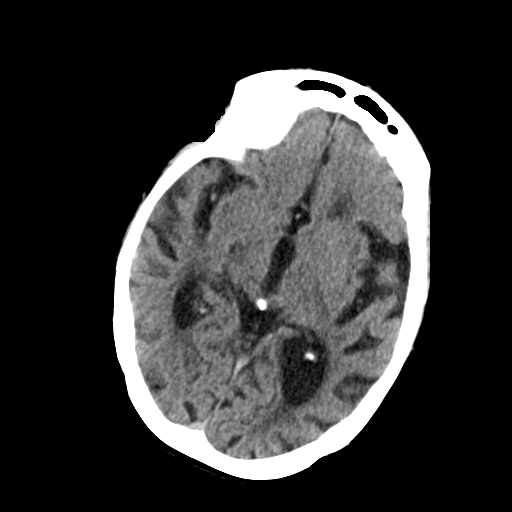
[im 17/33  brain]
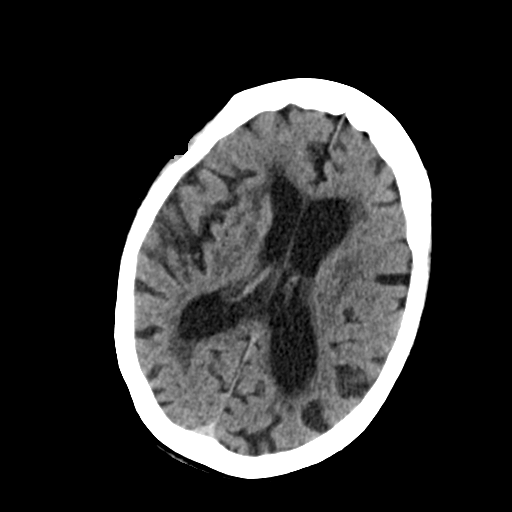
[im 17/33  bone]
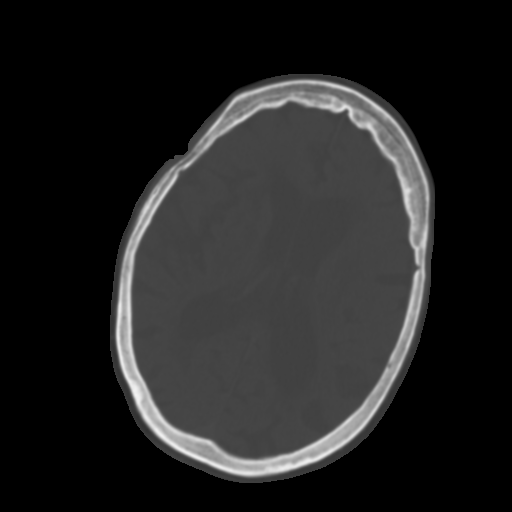
[im 21/33  brain]
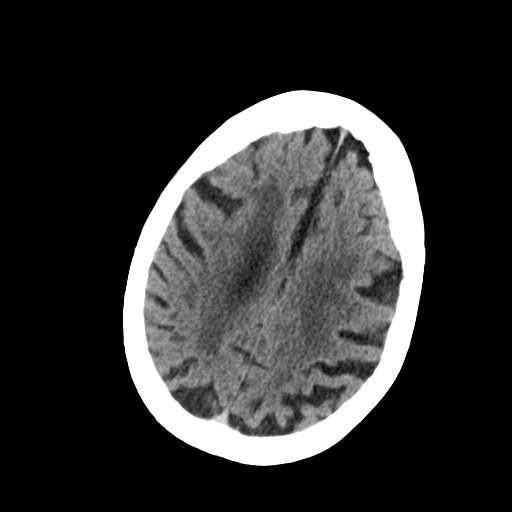
[im 24/33  brain]
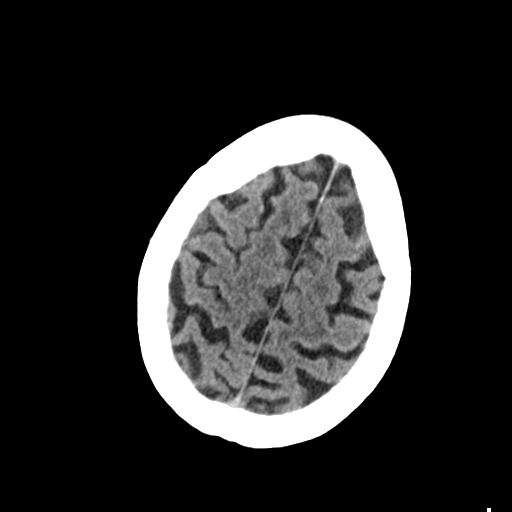
[im 27/33  brain]
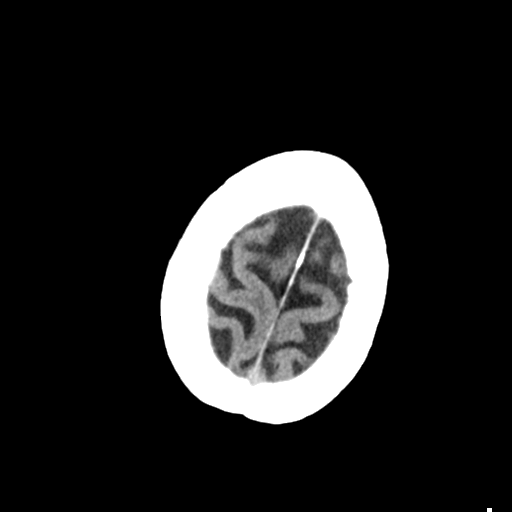
[im 31/33  brain]
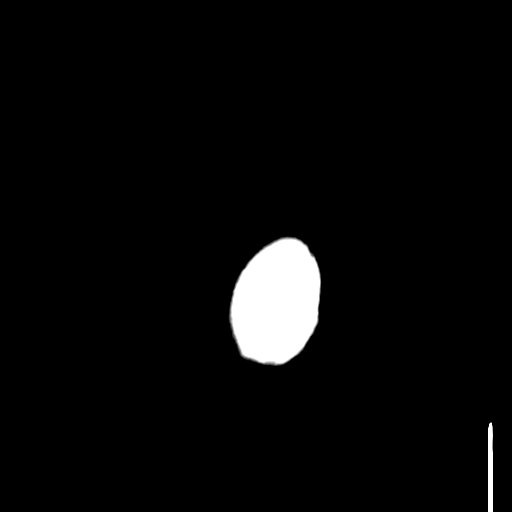
[im 31/33  bone]
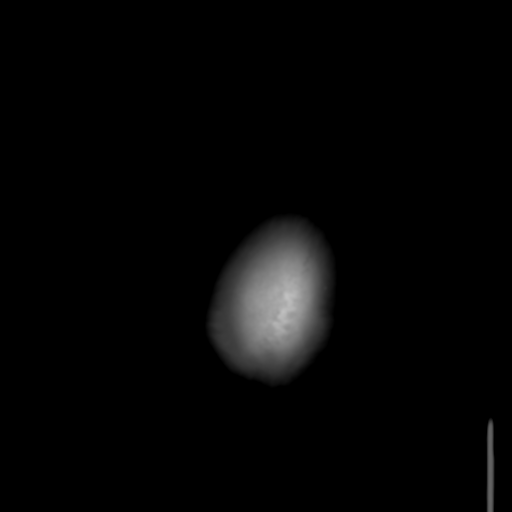

[Series 6: coronal soft tissue · coronal · 0.31mm/px · 3 of 71 slices shown]
[im 18/71  brain]
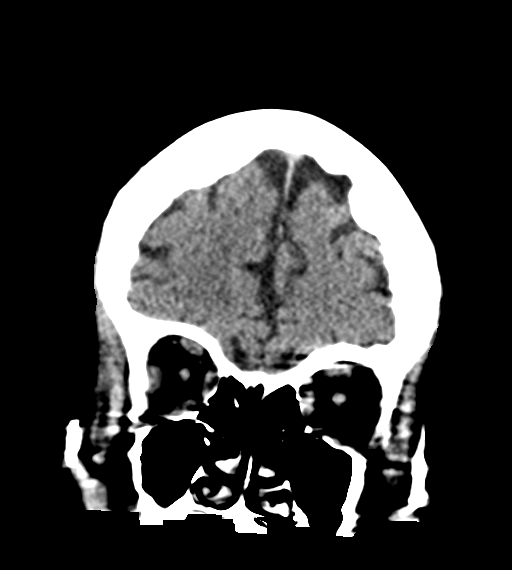
[im 36/71  brain]
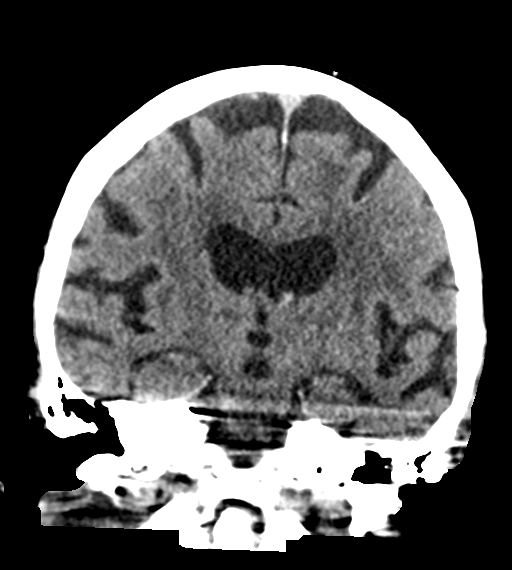
[im 53/71  brain]
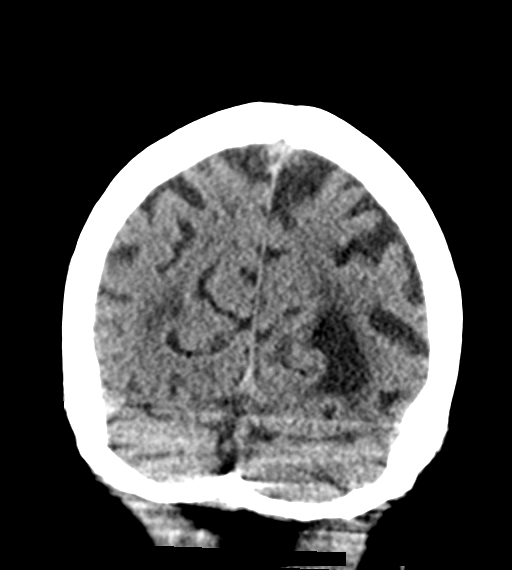

[Series 9: sagittal soft tissue · sagittal · 0.20mm/px · 2 of 67 slices shown]
[im 23/67  brain]
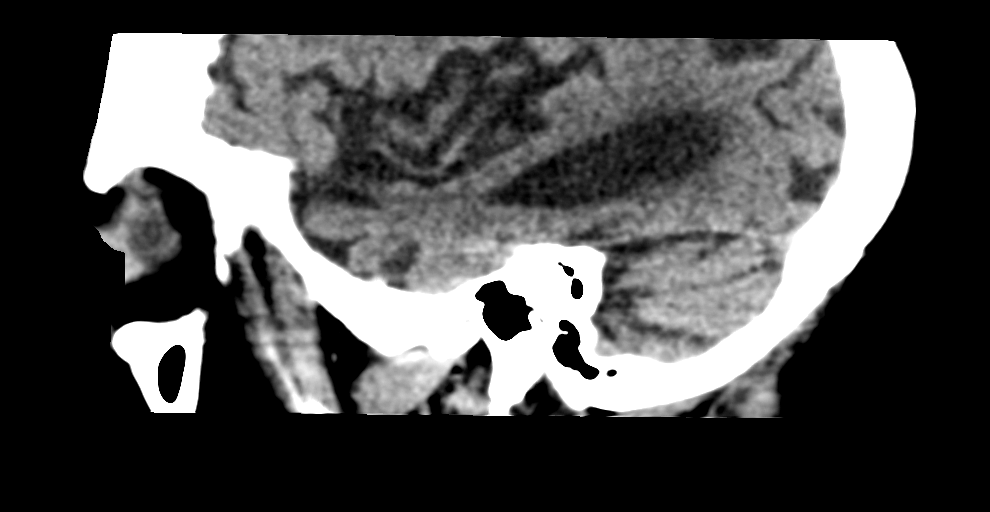
[im 45/67  brain]
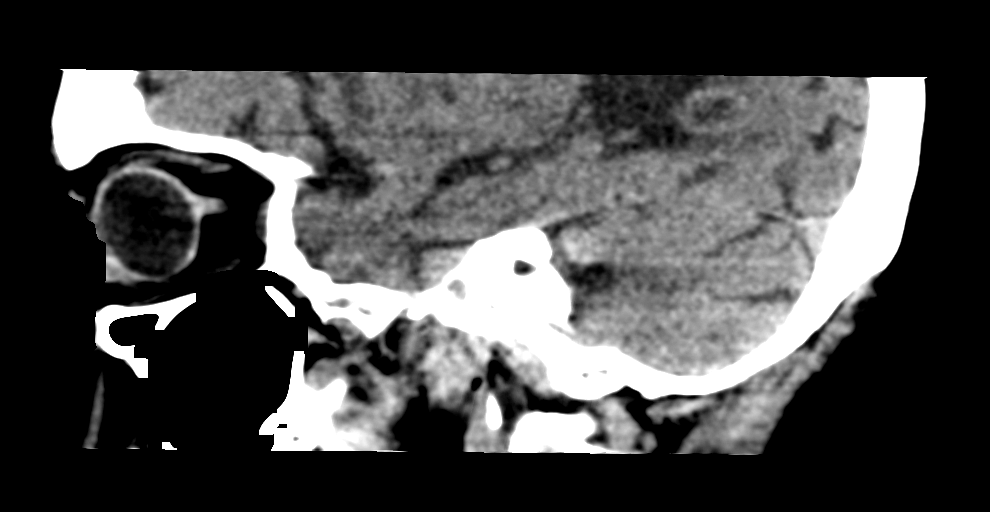

[14 of 47 positions shown; findings below may reference images not displayed]

FINDINGS: Brain: Moderate diffuse atrophy is stable. There is no intracranial
mass, hemorrhage, extra-axial fluid collection, or midline shift.
There is patchy small vessel disease in the centra semiovale
bilaterally, stable. There are small infarcts in each thalamus
region as well as in the anterior and posterior limbs of each
internal capsule. Small lacunar infarct in the head of the left
caudate nucleus noted. There is no acute appearing infarct on this
study.

Vascular: No hyperdense vessel. Calcification is noted in each
carotid siphon region.

Skull: The bony calvarium appears intact.

Sinuses/Orbits: There is mucosal thickening in several ethmoid air
cells. Other visualized paranasal sinuses are clear. Orbits appear
symmetric bilaterally.

Other: Mastoid air cells are clear.
IMPRESSION: Stable atrophy with extensive supratentorial small vessel disease.
There are basal ganglia and thalamic lacunar type infarcts
bilaterally, stable. No acute infarct evident. No mass or
hemorrhage.

There are foci of arterial vascular calcification. There is mucosal
thickening in several ethmoid air cells.

## 2019-11-07 ENCOUNTER — Emergency Department (HOSPITAL_COMMUNITY)
Admission: EM | Admit: 2019-11-07 | Discharge: 2019-11-07 | Disposition: A | Discharge status: Skilled Nursing Facility | Payer: Medicare Other | Attending: Emergency Medicine | Admitting: Emergency Medicine

## 2019-11-07 ENCOUNTER — Other Ambulatory Visit: Payer: Self-pay

## 2019-11-07 ENCOUNTER — Emergency Department (HOSPITAL_COMMUNITY): Payer: Medicare Other

## 2019-11-07 DIAGNOSIS — W19XXXA Unspecified fall, initial encounter: Secondary | ICD-10-CM | POA: Diagnosis not present

## 2019-11-07 DIAGNOSIS — Z79899 Other long term (current) drug therapy: Secondary | ICD-10-CM | POA: Insufficient documentation

## 2019-11-07 DIAGNOSIS — I2510 Atherosclerotic heart disease of native coronary artery without angina pectoris: Secondary | ICD-10-CM | POA: Insufficient documentation

## 2019-11-07 DIAGNOSIS — Z7982 Long term (current) use of aspirin: Secondary | ICD-10-CM | POA: Insufficient documentation

## 2019-11-07 DIAGNOSIS — Z87891 Personal history of nicotine dependence: Secondary | ICD-10-CM | POA: Insufficient documentation

## 2019-11-07 DIAGNOSIS — J449 Chronic obstructive pulmonary disease, unspecified: Secondary | ICD-10-CM | POA: Insufficient documentation

## 2019-11-07 DIAGNOSIS — Y92129 Unspecified place in nursing home as the place of occurrence of the external cause: Secondary | ICD-10-CM | POA: Diagnosis not present

## 2019-11-07 DIAGNOSIS — I10 Essential (primary) hypertension: Secondary | ICD-10-CM | POA: Diagnosis not present

## 2019-11-07 DIAGNOSIS — G309 Alzheimer's disease, unspecified: Secondary | ICD-10-CM | POA: Diagnosis not present

## 2019-11-07 DIAGNOSIS — S0990XA Unspecified injury of head, initial encounter: Secondary | ICD-10-CM | POA: Diagnosis not present

## 2019-11-07 DIAGNOSIS — Y999 Unspecified external cause status: Secondary | ICD-10-CM | POA: Diagnosis not present

## 2019-11-07 DIAGNOSIS — Y939 Activity, unspecified: Secondary | ICD-10-CM | POA: Insufficient documentation

## 2019-11-07 LAB — URINALYSIS, ROUTINE W REFLEX MICROSCOPIC
Bilirubin Urine: NEGATIVE
Glucose, UA: NEGATIVE mg/dL
Hgb urine dipstick: NEGATIVE
Ketones, ur: NEGATIVE mg/dL
Leukocytes,Ua: NEGATIVE
Nitrite: NEGATIVE
Protein, ur: NEGATIVE mg/dL
Specific Gravity, Urine: 1.017 (ref 1.005–1.030)
pH: 6 (ref 5.0–8.0)
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
## 2019-11-07 NOTE — ED Notes (Signed)
Patient was able ambulate. She was trying to hold on to things as she walked but stayed pretty stable. Daughter said she has a lot of problems walking when she first wakes up in the morning.
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
## 2019-11-07 NOTE — Discharge Instructions (Addendum)
Your CT images are negative for acute injury from todays fall and your urinalysis does not show you have a urinalysis.  Use caution with ambulating to help prevent future falls and injury.

## 2019-11-07 NOTE — ED Notes (Signed)
Ems here to transport pt,  

## 2019-11-07 NOTE — ED Triage Notes (Signed)
Pt brought in by [facility] for c/o fall; pt was found lying on her stomach when ems arrived; pt's only complaint is head pain; pt has a small bump to top of head; pt has dementia and is a poor historian

## 2019-11-07 NOTE — ED Provider Notes (Signed)
  Face-to-face evaluation   History: Patient presents for evaluation after fall, found on floor by EMS.  She is unable to give any history secondary to dementia.  Patient has a family with her at this time who states that she is concerned that the patient has a UTI.  She states that patient both frequently falls and has frequent UTIs.  Physical exam: Elderly patient, who is alert and responsive but confused.  No respiratory distress.  I am able to move both arms and legs easily without pain.  There is no palpable defect of the skull or scalp injury.  Abdomen is soft and nontender.  Medical screening examination/treatment/procedure(s) were conducted as a shared visit with non-physician practitioner(s) and myself.  I personally evaluated the patient during the encounter    Daleen Bo, MD 11/09/19 [phone]

## 2019-11-19 ENCOUNTER — Emergency Department (HOSPITAL_COMMUNITY)
Admission: EM | Admit: 2019-11-19 | Discharge: 2019-11-19 | Disposition: A | Discharge status: Skilled Nursing Facility | Payer: Medicare Other | Attending: Emergency Medicine | Admitting: Emergency Medicine

## 2019-11-19 ENCOUNTER — Emergency Department (HOSPITAL_COMMUNITY): Payer: Medicare Other

## 2019-11-19 ENCOUNTER — Other Ambulatory Visit: Payer: Self-pay

## 2019-11-19 ENCOUNTER — Encounter (HOSPITAL_COMMUNITY): Payer: Self-pay | Admitting: Emergency Medicine

## 2019-11-19 DIAGNOSIS — F0280 Dementia in other diseases classified elsewhere without behavioral disturbance: Secondary | ICD-10-CM | POA: Insufficient documentation

## 2019-11-19 DIAGNOSIS — I2510 Atherosclerotic heart disease of native coronary artery without angina pectoris: Secondary | ICD-10-CM | POA: Diagnosis not present

## 2019-11-19 DIAGNOSIS — Z79899 Other long term (current) drug therapy: Secondary | ICD-10-CM | POA: Diagnosis not present

## 2019-11-19 DIAGNOSIS — S0990XA Unspecified injury of head, initial encounter: Secondary | ICD-10-CM | POA: Insufficient documentation

## 2019-11-19 DIAGNOSIS — Z87891 Personal history of nicotine dependence: Secondary | ICD-10-CM | POA: Diagnosis not present

## 2019-11-19 DIAGNOSIS — Y92129 Unspecified place in nursing home as the place of occurrence of the external cause: Secondary | ICD-10-CM | POA: Diagnosis not present

## 2019-11-19 DIAGNOSIS — I10 Essential (primary) hypertension: Secondary | ICD-10-CM | POA: Diagnosis not present

## 2019-11-19 DIAGNOSIS — Y939 Activity, unspecified: Secondary | ICD-10-CM | POA: Insufficient documentation

## 2019-11-19 DIAGNOSIS — J449 Chronic obstructive pulmonary disease, unspecified: Secondary | ICD-10-CM | POA: Insufficient documentation

## 2019-11-19 DIAGNOSIS — Y999 Unspecified external cause status: Secondary | ICD-10-CM | POA: Diagnosis not present

## 2019-11-19 DIAGNOSIS — R4182 Altered mental status, unspecified: Secondary | ICD-10-CM | POA: Diagnosis not present

## 2019-11-19 DIAGNOSIS — W19XXXA Unspecified fall, initial encounter: Secondary | ICD-10-CM | POA: Diagnosis not present

## 2019-11-19 DIAGNOSIS — Z20822 Contact with and (suspected) exposure to covid-19: Secondary | ICD-10-CM

## 2019-11-19 DIAGNOSIS — S199XXA Unspecified injury of neck, initial encounter: Secondary | ICD-10-CM | POA: Insufficient documentation

## 2019-11-19 DIAGNOSIS — G309 Alzheimer's disease, unspecified: Secondary | ICD-10-CM | POA: Insufficient documentation

## 2019-11-19 LAB — URINALYSIS, ROUTINE W REFLEX MICROSCOPIC
Bilirubin Urine: NEGATIVE
Glucose, UA: NEGATIVE mg/dL
Ketones, ur: NEGATIVE mg/dL
Nitrite: NEGATIVE
Protein, ur: NEGATIVE mg/dL
Specific Gravity, Urine: 1.016 (ref 1.005–1.030)
pH: 6 (ref 5.0–8.0)

## 2019-11-19 NOTE — Discharge Instructions (Addendum)
CT scans of the head and cervical spine and x-rays of the chest and pelvis were reassuring.  Chest x-ray did show some hazy opacities that may be suggestive of COVID-19 infection but she has not had any hypoxia and is stable for discharge back to facility and continued monitoring of symptoms.  Urinalysis did not show clear signs of infection, culture sent.

## 2019-11-19 NOTE — ED Notes (Signed)
2nd call to [facility] requesting return transport. Alizzon Dioguardi
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
## 2019-11-19 NOTE — ED Notes (Signed)
[facility] here to transport pt back to facility.

## 2019-11-19 NOTE — ED Triage Notes (Signed)
Pt found lying on right side on floor by [facility] assisted living staff. Pt has no c/o pain.

## 2019-11-21 LAB — URINE CULTURE: Culture: NO GROWTH

## 2019-11-23 ENCOUNTER — Inpatient Hospital Stay (HOSPITAL_COMMUNITY)
Admission: EM | Admit: 2019-11-23 | Discharge: 2019-11-29 | DRG: 177 | Disposition: A | Discharge status: Hospice | Payer: Medicare Other | Source: Skilled Nursing Facility | Attending: Internal Medicine | Admitting: Internal Medicine

## 2019-11-23 ENCOUNTER — Other Ambulatory Visit: Payer: Self-pay

## 2019-11-23 ENCOUNTER — Emergency Department (HOSPITAL_COMMUNITY): Payer: Medicare Other

## 2019-11-23 ENCOUNTER — Encounter (HOSPITAL_COMMUNITY): Payer: Self-pay

## 2019-11-23 DIAGNOSIS — I10 Essential (primary) hypertension: Secondary | ICD-10-CM | POA: Diagnosis present

## 2019-11-23 DIAGNOSIS — J440 Chronic obstructive pulmonary disease with acute lower respiratory infection: Secondary | ICD-10-CM | POA: Diagnosis present

## 2019-11-23 DIAGNOSIS — F0281 Dementia in other diseases classified elsewhere with behavioral disturbance: Secondary | ICD-10-CM | POA: Diagnosis not present

## 2019-11-23 DIAGNOSIS — K219 Gastro-esophageal reflux disease without esophagitis: Secondary | ICD-10-CM | POA: Diagnosis present

## 2019-11-23 DIAGNOSIS — N390 Urinary tract infection, site not specified: Secondary | ICD-10-CM | POA: Diagnosis present

## 2019-11-23 DIAGNOSIS — U071 COVID-19: Secondary | ICD-10-CM | POA: Diagnosis not present

## 2019-11-23 DIAGNOSIS — Z515 Encounter for palliative care: Secondary | ICD-10-CM | POA: Diagnosis not present

## 2019-11-23 DIAGNOSIS — G309 Alzheimer's disease, unspecified: Secondary | ICD-10-CM | POA: Diagnosis present

## 2019-11-23 DIAGNOSIS — R64 Cachexia: Secondary | ICD-10-CM | POA: Diagnosis present

## 2019-11-23 DIAGNOSIS — M1990 Unspecified osteoarthritis, unspecified site: Secondary | ICD-10-CM | POA: Diagnosis present

## 2019-11-23 DIAGNOSIS — N179 Acute kidney failure, unspecified: Secondary | ICD-10-CM | POA: Diagnosis present

## 2019-11-23 DIAGNOSIS — R627 Adult failure to thrive: Secondary | ICD-10-CM | POA: Diagnosis present

## 2019-11-23 DIAGNOSIS — J9691 Respiratory failure, unspecified with hypoxia: Secondary | ICD-10-CM | POA: Diagnosis present

## 2019-11-23 DIAGNOSIS — Z87891 Personal history of nicotine dependence: Secondary | ICD-10-CM

## 2019-11-23 DIAGNOSIS — J9601 Acute respiratory failure with hypoxia: Secondary | ICD-10-CM | POA: Diagnosis not present

## 2019-11-23 DIAGNOSIS — Z7983 Long term (current) use of bisphosphonates: Secondary | ICD-10-CM

## 2019-11-23 DIAGNOSIS — Z7189 Other specified counseling: Secondary | ICD-10-CM | POA: Diagnosis not present

## 2019-11-23 DIAGNOSIS — E785 Hyperlipidemia, unspecified: Secondary | ICD-10-CM | POA: Diagnosis present

## 2019-11-23 DIAGNOSIS — F0280 Dementia in other diseases classified elsewhere without behavioral disturbance: Secondary | ICD-10-CM | POA: Diagnosis present

## 2019-11-23 DIAGNOSIS — Z7982 Long term (current) use of aspirin: Secondary | ICD-10-CM

## 2019-11-23 DIAGNOSIS — Z6822 Body mass index (BMI) 22.0-22.9, adult: Secondary | ICD-10-CM | POA: Diagnosis not present

## 2019-11-23 DIAGNOSIS — Z20822 Contact with and (suspected) exposure to covid-19: Secondary | ICD-10-CM

## 2019-11-23 DIAGNOSIS — G9341 Metabolic encephalopathy: Secondary | ICD-10-CM | POA: Diagnosis present

## 2019-11-23 DIAGNOSIS — F329 Major depressive disorder, single episode, unspecified: Secondary | ICD-10-CM | POA: Diagnosis present

## 2019-11-23 DIAGNOSIS — R339 Retention of urine, unspecified: Secondary | ICD-10-CM | POA: Diagnosis present

## 2019-11-23 DIAGNOSIS — Z981 Arthrodesis status: Secondary | ICD-10-CM

## 2019-11-23 DIAGNOSIS — G8929 Other chronic pain: Secondary | ICD-10-CM | POA: Diagnosis present

## 2019-11-23 DIAGNOSIS — Z66 Do not resuscitate: Secondary | ICD-10-CM | POA: Diagnosis present

## 2019-11-23 DIAGNOSIS — Z8249 Family history of ischemic heart disease and other diseases of the circulatory system: Secondary | ICD-10-CM

## 2019-11-23 DIAGNOSIS — Z87440 Personal history of urinary (tract) infections: Secondary | ICD-10-CM

## 2019-11-23 DIAGNOSIS — I2510 Atherosclerotic heart disease of native coronary artery without angina pectoris: Secondary | ICD-10-CM | POA: Diagnosis present

## 2019-11-23 DIAGNOSIS — Z79899 Other long term (current) drug therapy: Secondary | ICD-10-CM

## 2019-11-23 DIAGNOSIS — J1289 Other viral pneumonia: Secondary | ICD-10-CM | POA: Diagnosis present

## 2019-11-23 DIAGNOSIS — R32 Unspecified urinary incontinence: Secondary | ICD-10-CM | POA: Diagnosis present

## 2019-11-23 DIAGNOSIS — J1282 Pneumonia due to coronavirus disease 2019: Secondary | ICD-10-CM | POA: Diagnosis present

## 2019-11-23 DIAGNOSIS — Z820 Family history of epilepsy and other diseases of the nervous system: Secondary | ICD-10-CM

## 2019-11-23 HISTORY — DX: COVID-19: U07.1

## 2019-11-23 LAB — COMPREHENSIVE METABOLIC PANEL
ALT: 39 U/L (ref 0–44)
AST: 59 U/L — ABNORMAL HIGH (ref 15–41)
Albumin: 3.2 g/dL — ABNORMAL LOW (ref 3.5–5.0)
Alkaline Phosphatase: 53 U/L (ref 38–126)
Anion gap: 13 (ref 5–15)
BUN: 28 mg/dL — ABNORMAL HIGH (ref 8–23)
CO2: 24 mmol/L (ref 22–32)
Calcium: 8.6 mg/dL — ABNORMAL LOW (ref 8.9–10.3)
Chloride: 103 mmol/L (ref 98–111)
Creatinine, Ser: 0.9 mg/dL (ref 0.44–1.00)
GFR calc Af Amer: >60 mL/min (ref >60)
GFR calc non Af Amer: >60 mL/min (ref >60)
Glucose, Bld: 92 mg/dL (ref 70–99)
Potassium: 4.1 mmol/L (ref 3.5–5.1)
Sodium: 140 mmol/L (ref 135–145)
Total Bilirubin: 0.5 mg/dL (ref 0.3–1.2)
Total Protein: 6.2 g/dL — ABNORMAL LOW (ref 6.5–8.1)

## 2019-11-23 LAB — RESPIRATORY PANEL BY RT PCR (FLU A&B, COVID)
Influenza A by PCR: NEGATIVE
Influenza B by PCR: NEGATIVE
SARS Coronavirus 2 by RT PCR: POSITIVE — AB

## 2019-11-23 LAB — URINALYSIS, ROUTINE W REFLEX MICROSCOPIC
Bilirubin Urine: NEGATIVE
Glucose, UA: NEGATIVE mg/dL
Ketones, ur: NEGATIVE mg/dL
Nitrite: POSITIVE — AB
Protein, ur: NEGATIVE mg/dL
Specific Gravity, Urine: 1.017 (ref 1.005–1.030)
WBC, UA: >50 WBC/hpf — ABNORMAL HIGH (ref 0–5)
pH: 6 (ref 5.0–8.0)

## 2019-11-23 LAB — CBC WITH DIFFERENTIAL/PLATELET
Abs Immature Granulocytes: 0.02 10*3/uL (ref 0.00–0.07)
Basophils Absolute: 0 10*3/uL (ref 0.0–0.1)
Basophils Relative: 0 %
Eosinophils Absolute: 0 10*3/uL (ref 0.0–0.5)
Eosinophils Relative: 0 %
HCT: 40.7 % (ref 36.0–46.0)
Hemoglobin: 12.6 g/dL (ref 12.0–15.0)
Immature Granulocytes: 0 %
Lymphocytes Relative: 20 %
Lymphs Abs: 1.2 10*3/uL (ref 0.7–4.0)
MCH: 28.1 pg (ref 26.0–34.0)
MCHC: 31 g/dL (ref 30.0–36.0)
MCV: 90.8 fL (ref 80.0–100.0)
Monocytes Absolute: 0.3 10*3/uL (ref 0.1–1.0)
Monocytes Relative: 5 %
Neutro Abs: 4.2 10*3/uL (ref 1.7–7.7)
Neutrophils Relative %: 75 %
Platelets: 169 10*3/uL (ref 150–400)
RBC: 4.48 MIL/uL (ref 3.87–5.11)
RDW: 14.1 % (ref 11.5–15.5)
WBC: 5.7 10*3/uL (ref 4.0–10.5)
nRBC: 0 % (ref 0.0–0.2)

## 2019-11-23 LAB — PROCALCITONIN: Procalcitonin: <0.1 ng/mL

## 2019-11-23 LAB — POC SARS CORONAVIRUS 2 AG -  ED: SARS Coronavirus 2 Ag: NEGATIVE

## 2019-11-23 LAB — FIBRINOGEN: Fibrinogen: 324 mg/dL (ref 210–475)

## 2019-11-23 LAB — C-REACTIVE PROTEIN: CRP: 3.1 mg/dL — ABNORMAL HIGH (ref <1.0)

## 2019-11-23 LAB — TRIGLYCERIDES: Triglycerides: 94 mg/dL (ref <150)

## 2019-11-23 LAB — D-DIMER, QUANTITATIVE: D-Dimer, Quant: 1.69 ug/mL-FEU — ABNORMAL HIGH (ref 0.00–0.50)

## 2019-11-23 LAB — LACTIC ACID, PLASMA: Lactic Acid, Venous: 1.7 mmol/L (ref 0.5–1.9)

## 2019-11-23 LAB — FERRITIN: Ferritin: 372 ng/mL — ABNORMAL HIGH (ref 11–307)

## 2019-11-23 LAB — LACTATE DEHYDROGENASE: LDH: 329 U/L — ABNORMAL HIGH (ref 98–192)

## 2019-11-23 MED ORDER — PANTOPRAZOLE SODIUM 40 MG PO TBEC
40.0000 mg | DELAYED_RELEASE_TABLET | Freq: Every day | ORAL | Status: DC
  Administered 2019-11-25 – 2019-11-29 (×5): 40 mg via ORAL
  Filled 2019-11-23 (×5): qty 1

## 2019-11-23 MED ORDER — ISOSORBIDE MONONITRATE ER 60 MG PO TB24
60.0000 mg | ORAL_TABLET | Freq: Every day | ORAL | Status: DC
  Administered 2019-11-25 – 2019-11-29 (×5): 60 mg via ORAL
  Filled 2019-11-23 (×5): qty 1

## 2019-11-23 MED ORDER — SODIUM CHLORIDE 0.9 % IV SOLN
500.0000 mg | Freq: Once | INTRAVENOUS | Status: AC
  Administered 2019-11-23: 500 mg via INTRAVENOUS
  Filled 2019-11-23: qty 500

## 2019-11-23 MED ORDER — OXYBUTYNIN CHLORIDE 5 MG PO TABS
10.0000 mg | ORAL_TABLET | Freq: Every day | ORAL | Status: DC
  Administered 2019-11-25 – 2019-11-28 (×4): 10 mg via ORAL
  Filled 2019-11-23 (×5): qty 2

## 2019-11-23 MED ORDER — ASPIRIN EC 81 MG PO TBEC
81.0000 mg | DELAYED_RELEASE_TABLET | Freq: Every day | ORAL | Status: DC
  Administered 2019-11-25 – 2019-11-29 (×5): 81 mg via ORAL
  Filled 2019-11-23 (×5): qty 1

## 2019-11-23 MED ORDER — ENOXAPARIN SODIUM 40 MG/0.4ML ~~LOC~~ SOLN
40.0000 mg | SUBCUTANEOUS | Status: DC
  Administered 2019-11-24 – 2019-11-29 (×6): 40 mg via SUBCUTANEOUS
  Filled 2019-11-23 (×6): qty 0.4

## 2019-11-23 MED ORDER — BUPROPION HCL ER (XL) 300 MG PO TB24
300.0000 mg | ORAL_TABLET | Freq: Every day | ORAL | Status: DC
  Administered 2019-11-25 – 2019-11-29 (×5): 300 mg via ORAL
  Filled 2019-11-23 (×5): qty 1

## 2019-11-23 MED ORDER — DOCUSATE SODIUM 100 MG PO CAPS
100.0000 mg | ORAL_CAPSULE | Freq: Two times a day (BID) | ORAL | Status: DC
  Administered 2019-11-25 – 2019-11-29 (×9): 100 mg via ORAL
  Filled 2019-11-23 (×9): qty 1

## 2019-11-23 MED ORDER — SODIUM CHLORIDE 0.9 % IV SOLN
1.0000 g | Freq: Once | INTRAVENOUS | Status: AC
  Administered 2019-11-23: 1 g via INTRAVENOUS
  Filled 2019-11-23: qty 10

## 2019-11-23 MED ORDER — AMLODIPINE BESYLATE 5 MG PO TABS
5.0000 mg | ORAL_TABLET | Freq: Every day | ORAL | Status: DC
  Administered 2019-11-25 – 2019-11-29 (×5): 5 mg via ORAL
  Filled 2019-11-23 (×5): qty 1

## 2019-11-23 MED ORDER — DONEPEZIL HCL 5 MG PO TABS
10.0000 mg | ORAL_TABLET | Freq: Every day | ORAL | Status: DC
  Administered 2019-11-25 – 2019-11-29 (×5): 10 mg via ORAL
  Filled 2019-11-23 (×2): qty 2
  Filled 2019-11-23: qty 1
  Filled 2019-11-23 (×2): qty 2
  Filled 2019-11-23: qty 1
  Filled 2019-11-23: qty 2

## 2019-11-23 MED ORDER — QUETIAPINE FUMARATE 100 MG PO TABS
100.0000 mg | ORAL_TABLET | Freq: Every day | ORAL | Status: DC
  Administered 2019-11-26 – 2019-11-28 (×3): 100 mg via ORAL
  Filled 2019-11-23 (×5): qty 1

## 2019-11-23 MED ORDER — DEXAMETHASONE SODIUM PHOSPHATE 10 MG/ML IJ SOLN
6.0000 mg | Freq: Once | INTRAMUSCULAR | Status: AC
  Administered 2019-11-23: 6 mg via INTRAVENOUS
  Filled 2019-11-23: qty 1

## 2019-11-23 MED ORDER — MEMANTINE HCL 10 MG PO TABS
10.0000 mg | ORAL_TABLET | Freq: Two times a day (BID) | ORAL | Status: DC
  Administered 2019-11-25 – 2019-11-29 (×9): 10 mg via ORAL
  Filled 2019-11-23 (×10): qty 1

## 2019-11-23 MED ORDER — DARIFENACIN HYDROBROMIDE ER 7.5 MG PO TB24
7.5000 mg | ORAL_TABLET | Freq: Every day | ORAL | Status: DC
  Administered 2019-11-25 – 2019-11-29 (×4): 7.5 mg via ORAL
  Filled 2019-11-23 (×5): qty 1

## 2019-11-23 MED ORDER — PRAVASTATIN SODIUM 40 MG PO TABS
40.0000 mg | ORAL_TABLET | Freq: Every evening | ORAL | Status: DC
  Administered 2019-11-25 – 2019-11-28 (×4): 40 mg via ORAL
  Filled 2019-11-23 (×5): qty 1

## 2019-11-23 MED ORDER — LISINOPRIL 10 MG PO TABS
10.0000 mg | ORAL_TABLET | Freq: Every day | ORAL | Status: DC
  Administered 2019-11-25 – 2019-11-29 (×5): 10 mg via ORAL
  Filled 2019-11-23 (×5): qty 1
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
## 2019-11-23 NOTE — ED Notes (Signed)
Pt disoriented. Remains figedty. Pulls off mask, picking at gown and all tubing. Prefers to lay on right side with head on siderail and knees in fetal position

## 2019-11-23 NOTE — ED Notes (Signed)
Pt continues to be restless. Left IV site noted to be infiltrated. IV removed

## 2019-11-23 NOTE — ED Notes (Signed)
CRITICAL VALUE ALERT  Critical Value:  Positive covid  Date & Time Notied:  1650   Provider Notified: [place]  Orders Received/Actions taken:

## 2019-11-23 NOTE — ED Notes (Signed)
Abl;e to collect one blue culture bottle at this time
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
## 2019-11-23 NOTE — ED Triage Notes (Signed)
Pt dx with covid on Monday at [facility]. Pt sent to ED due to not eating

## 2019-11-24 ENCOUNTER — Encounter (HOSPITAL_COMMUNITY): Payer: Self-pay | Admitting: Hospitalist

## 2019-11-24 DIAGNOSIS — N390 Urinary tract infection, site not specified: Secondary | ICD-10-CM

## 2019-11-24 DIAGNOSIS — I10 Essential (primary) hypertension: Secondary | ICD-10-CM

## 2019-11-24 DIAGNOSIS — G309 Alzheimer's disease, unspecified: Secondary | ICD-10-CM

## 2019-11-24 DIAGNOSIS — F0281 Dementia in other diseases classified elsewhere with behavioral disturbance: Secondary | ICD-10-CM

## 2019-11-24 DIAGNOSIS — G9341 Metabolic encephalopathy: Secondary | ICD-10-CM

## 2019-11-24 MED ORDER — SODIUM CHLORIDE 0.9 % IV SOLN
200.0000 mg | Freq: Once | INTRAVENOUS | Status: AC
  Administered 2019-11-24: 200 mg via INTRAVENOUS
  Filled 2019-11-24: qty 40

## 2019-11-24 MED ORDER — DEXAMETHASONE SODIUM PHOSPHATE 10 MG/ML IJ SOLN
6.0000 mg | INTRAMUSCULAR | Status: AC
  Administered 2019-11-24 – 2019-11-28 (×5): 6 mg via INTRAVENOUS
  Filled 2019-11-24 (×6): qty 1

## 2019-11-24 MED ORDER — SODIUM CHLORIDE 0.9 % IV SOLN
1.0000 g | INTRAVENOUS | Status: DC
  Administered 2019-11-24 – 2019-11-27 (×4): 1 g via INTRAVENOUS
  Filled 2019-11-24 (×4): qty 10

## 2019-11-24 MED ORDER — SODIUM CHLORIDE 0.9 % IV SOLN
100.0000 mg | Freq: Every day | INTRAVENOUS | Status: AC
  Administered 2019-11-25 – 2019-11-28 (×4): 100 mg via INTRAVENOUS
  Filled 2019-11-24 (×3): qty 100
  Filled 2019-11-24: qty 20

## 2019-11-24 NOTE — Progress Notes (Signed)
PROGRESS NOTE    Melissa Baird  [id number] DOB: Dec 16, 1940 DOA: 11/23/2019 PCP: Lucia Gaskins, MD     Brief Narrative:  78 y.o. female with medical history significant of dementia, COPD, HTN who presented from long-term SNF for not eating.  Pt has dementia and could not provide hx or answer ROS questions.  Pt was send from the SNF for not eating or drinking for the past day, and was suspected of having COVID (unclear whether it was confirmed).  Per ED provider, "No report of any vomiting or diarrhea. No pain with urination or blood in the urine."  Pt was seen in the ED on 12/8 for a fall and discharged back to the facility from the ED.  ED Course: initial vitals: temp 100.7, BP 181/80, sating 76% on room air and was put on 3L O2.  Labs notable for normal WBC, normal lactic acid, neg procal, CXR showed "Coarse interstitial opacities bilaterally".  "Finding suggest acute on chronic process possibly infection superimposed on a background of scarring/chronic lung disease."  Pt received ceftriaxone, azithromycin and IV dacadron 6 mg in the ED.  positive COVID PCR.   Assessment & Plan: 1-Respiratory failure with hypoxia (HCC) -Currently afebrile -Still requiring 4-5 L nasal cannula supplementation -Continue steroids -Continue remdesivir -Continue supportive care -As needed bronchodilators.  2-acute metabolic encephalopathy/underlying Alzheimer's dementia with depression -Given abnormal urinalysis suggesting infection and ongoing worsening mentation we will continue treatment with IV Rocephin and follow cultures. -Constant reorientation -Continue the use of donezepil, Namenda, bupropion and Seroquel  3-GERD -Continue PPI  4-hypertension -Continue amlodipine, Imdur and lisinopril.  5-hx of urinary retention/incontinence  -will continue vesicare and oxybutynin -tx for UTI as mentioned above   6-DNR/DNI -Patient prognosis is guarded -Prior to admission was  already no eating or drinking properly at skilled nursing facility -Will respect her wishes and treat what is treatable   DVT prophylaxis: Lovenox Code Status: DNR/DNI Family Communication: Discharge back to skilled nursing facility once medically stable. Disposition Plan: Remains inpatient, continue IV steroids and remdesivir.  Urinalysis suggesting also complaining of UTI and will start patient on IV Rocephin.  Follow urine culture.  Follow clinical response.  Continue supportive care  Consultants:   None  Procedures:   See below for x-ray reports.  Antimicrobials:  Anti-infectives (From admission, onward)   Start     Dose/Rate Route Frequency Ordered Stop   11/25/19 1000  remdesivir 100 mg in sodium chloride 0.9 % 100 mL IVPB     100 mg 200 mL/hr over 30 Minutes Intravenous Daily 11/24/19 0114 11/29/19 0959   11/24/19 0200  remdesivir 200 mg in sodium chloride 0.9% 250 mL IVPB     200 mg 580 mL/hr over 30 Minutes Intravenous Once 11/24/19 0114 11/24/19 0308   11/23/19 1345  cefTRIAXone (ROCEPHIN) 1 g in sodium chloride 0.9 % 100 mL IVPB     1 g 200 mL/hr over 30 Minutes Intravenous  Once 11/23/19 1332 11/23/19 1425   11/23/19 1345  azithromycin (ZITHROMAX) 500 mg in sodium chloride 0.9 % 250 mL IVPB     500 mg 250 mL/hr over 60 Minutes Intravenous  Once 11/23/19 1332 11/23/19 1453      Subjective: No fever, still short of breath requiring oxygen supplementation.  No nausea, no vomiting.  Patient refusing to eat and intermittently refusing to take her medications as well.  Requiring 4-5 L nasal cannula supplementation.  Objective: Vitals:   11/24/19 0048 11/24/19 0100 11/24/19 0509 11/24/19 1509  BP: Marland Kitchen)  151/102 (!) 146/55 (!) 127/48 (!) 125/97  Pulse:  (!) 55 (!) 59   Resp:   18 18  Temp:   97.9 F (36.6 C) (!) 97.5 F (36.4 C)  TempSrc:   Oral Axillary  SpO2:  100% 97% (!) 79%  Weight:        Intake/Output Summary (Last 24 hours) at 11/24/2019 1552 Last data  filed at 11/24/2019 1300 Gross per 24 hour  Intake 256 ml  Output --  Net 256 ml   Filed Weights   11/23/19 1138  Weight: 62 kg    Examination: General exam: Alert, oriented x1; currently afebrile. No nausea, no vomiting. No eating and refusing PO meds intermittently.  Respiratory system: Positive rhonchi, no wheezing, no using accessory muscles.  Requiring 4 L nasal cannula supplementation.   Cardiovascular system:RRR. No murmurs, rubs, gallops. Gastrointestinal system: Abdomen is nondistended, soft and nontender. No organomegaly or masses felt. Normal bowel sounds heard. Central nervous system: No focal neurological deficits. Extremities: No Cyanosis, no clubbing. Skin: No petechiae. Psychiatry: Mood & affect appropriate.     Data Reviewed: I have personally reviewed following labs and imaging studies  CBC: Recent Labs  Lab 11/23/19 1205  WBC 5.7  NEUTROABS 4.2  HGB 12.6  HCT 40.7  MCV 90.8  PLT 123XX123   Basic Metabolic Panel: Recent Labs  Lab 11/23/19 1205  NA 140  K 4.1  CL 103  CO2 24  GLUCOSE 92  BUN 28*  CREATININE 0.90  CALCIUM 8.6*   GFR: Estimated Creatinine Clearance: 47.1 mL/min (by C-G formula based on SCr of 0.9 mg/dL).   Liver Function Tests: Recent Labs  Lab 11/23/19 1205  AST 59*  ALT 39  ALKPHOS 53  BILITOT 0.5  PROT 6.2*  ALBUMIN 3.2*   Lipid Profile: Recent Labs    11/23/19 1215  TRIG 94   Anemia Panel: Recent Labs    11/23/19 1227  FERRITIN 372*   Urine analysis:    Component Value Date/Time   COLORURINE YELLOW 11/23/2019 1205   APPEARANCEUR HAZY (A) 11/23/2019 1205   LABSPEC 1.017 11/23/2019 1205   PHURINE 6.0 11/23/2019 1205   GLUCOSEU NEGATIVE 11/23/2019 1205   HGBUR SMALL (A) 11/23/2019 1205   BILIRUBINUR NEGATIVE 11/23/2019 [place] 11/23/2019 1205   PROTEINUR NEGATIVE 11/23/2019 1205   UROBILINOGEN 0.2 10/01/2015 1004   NITRITE POSITIVE (A) 11/23/2019 1205   LEUKOCYTESUR LARGE (A)  11/23/2019 1205    Recent Results (from the past 240 hour(s))  Urine culture     Status: None   Collection Time: 11/19/19  9:38 PM   Specimen: Urine, Random  Result Value Ref Range Status   Specimen Description   Final    URINE, RANDOM Performed at [facility], [address], [place], [place] [address]    Special Requests   Final    NONE Performed at [facility], [address], [place], [place] [address]    Culture   Final    NO GROWTH Performed at [facility], [place] [address], [place], [place] [address]    Report Status 11/21/2019 FINAL  Final  Blood culture (routine x 2)     Status: None (Preliminary result)   Collection Time: 11/23/19 12:06 PM   Specimen: BLOOD RIGHT FOREARM  Result Value Ref Range Status   Specimen Description BLOOD RIGHT FOREARM BOTTLES DRAWN AEROBIC ONLY  Final   Special Requests   Final    Blood Culture results may not be  optimal due to an inadequate volume of blood received in culture bottles Performed at [facility], [address] [place], [place] [address]    Culture PENDING  Incomplete   Report Status PENDING  Incomplete  Blood culture (routine x 2)     Status: None (Preliminary result)   Collection Time: 11/23/19 12:37 PM   Specimen: Left Antecubital; Blood  Result Value Ref Range Status   Specimen Description   Final    LEFT ANTECUBITAL BOTTLES DRAWN AEROBIC AND ANAEROBIC   Special Requests   Final    Blood Culture adequate volume Performed at [facility], [address] [place], [place] [address]    Culture PENDING  Incomplete   Report Status PENDING  Incomplete  Respiratory Panel by RT PCR (Flu A&B, Covid) - Nasopharyngeal Swab     Status: Abnormal   Collection Time: 11/23/19  1:04 PM   Specimen: Nasopharyngeal Swab  Result Value Ref Range Status   SARS Coronavirus 2 by RT PCR POSITIVE (A) NEGATIVE Final    Comment: RESULT CALLED TO, READ BACK BY AND VERIFIED WITH:  ROBIN GENTRY,RN @1649   11/23/2019 KAY (NOTE) SARS-CoV-2  target nucleic acids are DETECTED. SARS-CoV-2 RNA is generally detectable in upper respiratory specimens  during the acute phase of infection. Positive results are indicative of the presence of the identified virus, but do not rule out bacterial infection or co-infection with other pathogens not detected by the test. Clinical correlation with patient history and other diagnostic information is necessary to determine patient infection status. The expected result is Negative. Fact Sheet for Patients:  PinkCheek.be Fact Sheet for Healthcare Providers: GravelBags.it This test is not yet approved or cleared by the [place] FDA and  has been authorized for detection and/or diagnosis of SARS-CoV-2 by FDA under an Emergency Use Authorization (EUA).  This EUA will remain in effect (meaning this test can be used ) for the duration of  the COVID-19 declaration under Section 564(b)(1) of the Act, 21 U.S.C. section 360bbb-3(b)(1), unless the authorization is terminated or revoked sooner.    Influenza A by PCR NEGATIVE NEGATIVE Final   Influenza B by PCR NEGATIVE NEGATIVE Final    Comment: (NOTE) The Xpert Xpress SARS-CoV-2/FLU/RSV assay is intended as an aid in  the diagnosis of influenza from Nasopharyngeal swab specimens and  should not be used as a sole basis for treatment. Nasal washings and  aspirates are unacceptable for Xpert Xpress SARS-CoV-2/FLU/RSV  testing. Fact Sheet for Patients: PinkCheek.be Fact Sheet for Healthcare Providers: GravelBags.it This test is not yet approved or cleared by the [place] FDA and  has been authorized for detection and/or diagnosis of SARS-CoV-2 by  FDA under an Emergency Use Authorization (EUA). This EUA will remain  in effect (meaning this test can be used) for the duration of the  Covid-19 declaration under Section 564(b)(1) of  the Act, 21  U.S.C. section 360bbb-3(b)(1), unless the authorization is  terminated or revoked. Performed at [facility], [address], [place], [place] [address]      Radiology Studies: DG Chest Portable 1 View  Result Date: 11/23/2019 CLINICAL DATA:  Pt dx with covid on Monday at [facility]. Pt sent to ED due to not eating. EXAM: PORTABLE CHEST 1 VIEW COMPARISON:  Chest radiograph 11/19/2019 FINDINGS: Stable cardiomediastinal contours with normal heart size. Multiple metallic fragments again overlie the right chest. There are coarse interstitial opacities bilaterally more pronounced throughout the right lung and in the left lung apex. No pneumothorax or  large pleural effusion. No acute finding in the visualized skeleton. IMPRESSION: Coarse interstitial opacities bilaterally, more pronounced in the right lung and left lung apex may be slightly increased from prior. Finding suggest acute on chronic process possibly infection superimposed on a background of scarring/chronic lung disease. Electronically Signed   By: Audie Pinto M.D.   On: 11/23/2019 12:40    Scheduled Meds: . amLODipine  5 mg Oral Daily  . aspirin EC  81 mg Oral Daily  . buPROPion  300 mg Oral Daily  . darifenacin  7.5 mg Oral Daily  . dexamethasone (DECADRON) injection  6 mg Intravenous Q24H  . docusate sodium  100 mg Oral BID  . donepezil  10 mg Oral Daily  . enoxaparin (LOVENOX) injection  40 mg Subcutaneous Q24H  . isosorbide mononitrate  60 mg Oral Daily  . lisinopril  10 mg Oral Daily  . memantine  10 mg Oral BID  . oxybutynin  10 mg Oral QHS  . pantoprazole  40 mg Oral Daily  . pravastatin  40 mg Oral QPM  . QUEtiapine  100 mg Oral QHS   Continuous Infusions: . [START ON 11/25/2019] remdesivir 100 mg in NS 100 mL       LOS: 1 day    Time spent: 35 minutes. Greater than 50% of this time was spent in direct contact with the patient, coordinating care and discussing relevant ongoing clinical issues,  including discussion about acute hypoxemic respiratory failure in the setting of COVID-19 PNA.  Patient secondary to her underlying history of dementia and is having difficulty following commands, take medication by mouth or keeping her oxygen supplementation in place.  We will continue constant reorientation.    Barton Dubois, MD [facility] Pager [phone]   11/24/2019, 3:52 PM

## 2019-11-25 NOTE — Progress Notes (Signed)
PROGRESS NOTE    CLEVE LLAGUNO  [id number] DOB: May 06, 1941 DOA: 11/23/2019 PCP: Lucia Gaskins, MD     Brief Narrative:  78 y.o. female with medical history significant of dementia, COPD, HTN who presented from long-term SNF for not eating.  Pt has dementia and could not provide hx or answer ROS questions.  Pt was send from the SNF for not eating or drinking for the past day, and was suspected of having COVID (unclear whether it was confirmed).  Per ED provider, "No report of any vomiting or diarrhea. No pain with urination or blood in the urine."  Pt was seen in the ED on 12/8 for a fall and discharged back to the facility from the ED.  ED Course: initial vitals: temp 100.7, BP 181/80, sating 76% on room air and was put on 3L O2.  Labs notable for normal WBC, normal lactic acid, neg procal, CXR showed "Coarse interstitial opacities bilaterally".  "Finding suggest acute on chronic process possibly infection superimposed on a background of scarring/chronic lung disease."  Pt received ceftriaxone, azithromycin and IV dacadron 6 mg in the ED.  positive COVID PCR.   Assessment & Plan: 1-Respiratory failure with hypoxia (HCC) -Currently afebrile -Still requiring 4-5 L nasal cannula supplementation -Continue steroids and the use of remdesivir -Continue supportive care and follow clinical response. -Continue as needed bronchodilators.  2-acute metabolic encephalopathy/underlying Alzheimer's dementia with depression -Given abnormal urinalysis suggesting infection and ongoing worsening mentation we will continue treatment with IV Rocephin and follow cultures. -Constant reorientation -Continue the use of donezepil, Namenda, bupropion and Seroquel  3-GERD -Continue PPI  4-hypertension -Continue amlodipine, Imdur and lisinopril.  5-acute UTI/hx of urinary retention/incontinence  -will continue vesicare and oxybutynin -tx for UTI as mentioned above; continue Rocephin.  -Follow urine cultures  6-DNR/DNI -Patient prognosis is guarded -Prior to admission was already no eating or drinking properly at skilled nursing facility -Will respect her wishes and treat what is treatable   DVT prophylaxis: Lovenox Code Status: DNR/DNI Family Communication: Discharge back to skilled nursing facility once medically stable. Disposition Plan: Remains inpatient, continue IV steroids and remdesivir.  Urinalysis suggesting also complaining of UTI and will start patient on IV Rocephin.  Follow urine culture.  Follow clinical response.  Continue supportive care  Consultants:   None  Procedures:   See below for x-ray reports.  Antimicrobials:  Anti-infectives (From admission, onward)   Start     Dose/Rate Route Frequency Ordered Stop   11/25/19 1000  remdesivir 100 mg in sodium chloride 0.9 % 100 mL IVPB     100 mg 200 mL/hr over 30 Minutes Intravenous Daily 11/24/19 0114 11/29/19 0959   11/24/19 1600  cefTRIAXone (ROCEPHIN) 1 g in sodium chloride 0.9 % 100 mL IVPB     1 g 200 mL/hr over 30 Minutes Intravenous Every 24 hours 11/24/19 1557     11/24/19 0200  remdesivir 200 mg in sodium chloride 0.9% 250 mL IVPB     200 mg 580 mL/hr over 30 Minutes Intravenous Once 11/24/19 0114 11/24/19 0308   11/23/19 1345  cefTRIAXone (ROCEPHIN) 1 g in sodium chloride 0.9 % 100 mL IVPB     1 g 200 mL/hr over 30 Minutes Intravenous  Once 11/23/19 1332 11/23/19 1425   11/23/19 1345  azithromycin (ZITHROMAX) 500 mg in sodium chloride 0.9 % 250 mL IVPB     500 mg 250 mL/hr over 60 Minutes Intravenous  Once 11/23/19 1332 11/23/19 1453      Subjective: No  fever, no chest pain, no nausea vomiting.  Still requiring nasal cannula supplementation to maintain O2 sat above 90% (3 L currently).  Per nursing staff continue to be intermittently refusing treatment.  Objective: Vitals:   11/24/19 0509 11/24/19 1509 11/24/19 2106 11/25/19 0545  BP: (!) 127/48 (!) 125/97 101/86 126/80   Pulse: (!) 59  76 67  Resp: 18 18 20 20   Temp: 97.9 F (36.6 C) (!) 97.5 F (36.4 C) 98.3 F (36.8 C) 97.9 F (36.6 C)  TempSrc: Oral Axillary Oral Oral  SpO2: 97% (!) 79% 91% 95%  Weight:        Intake/Output Summary (Last 24 hours) at 11/25/2019 1403 Last data filed at 11/25/2019 0500 Gross per 24 hour  Intake 100.07 ml  Output -  Net 100.07 ml   Filed Weights   11/23/19 1138  Weight: 62 kg    Examination: General exam: Alert, awake, oriented x 1; no nausea, no vomiting, no chest pain.  Currently expressed feeling okay while using oxygen supplementation O2 sats 98 200%.  She is on 3 L [place] Respiratory system: Positive rhonchi, no wheezing, no using accessory muscles. Cardiovascular system:RRR. No murmurs, rubs, gallops. Gastrointestinal system: Abdomen is nondistended, soft and nontender. No organomegaly or masses felt. Normal bowel sounds heard. Central nervous system: Alert and oriented. No focal neurological deficits. Extremities: No cyanosis or clubbing; no edema appreciated. Skin: No open wounds; no petechiae. Psychiatry: Judgement and insight impaired in the setting of underlying dementia; mood stable.  No agitation.     Data Reviewed: I have personally reviewed following labs and imaging studies  CBC: Recent Labs  Lab 11/23/19 1205  WBC 5.7  NEUTROABS 4.2  HGB 12.6  HCT 40.7  MCV 90.8  PLT 123XX123   Basic Metabolic Panel: Recent Labs  Lab 11/23/19 1205  NA 140  K 4.1  CL 103  CO2 24  GLUCOSE 92  BUN 28*  CREATININE 0.90  CALCIUM 8.6*   GFR: Estimated Creatinine Clearance: 47.1 mL/min (by C-G formula based on SCr of 0.9 mg/dL).   Liver Function Tests: Recent Labs  Lab 11/23/19 1205  AST 59*  ALT 39  ALKPHOS 53  BILITOT 0.5  PROT 6.2*  ALBUMIN 3.2*   Lipid Profile: Recent Labs    11/23/19 1215  TRIG 94   Anemia Panel: Recent Labs    11/23/19 1227  FERRITIN 372*   Urine analysis:    Component Value Date/Time   COLORURINE  YELLOW 11/23/2019 1205   APPEARANCEUR HAZY (A) 11/23/2019 1205   LABSPEC 1.017 11/23/2019 1205   PHURINE 6.0 11/23/2019 1205   GLUCOSEU NEGATIVE 11/23/2019 1205   HGBUR SMALL (A) 11/23/2019 1205   BILIRUBINUR NEGATIVE 11/23/2019 [place] 11/23/2019 1205   PROTEINUR NEGATIVE 11/23/2019 1205   UROBILINOGEN 0.2 10/01/2015 1004   NITRITE POSITIVE (A) 11/23/2019 1205   LEUKOCYTESUR LARGE (A) 11/23/2019 1205    Recent Results (from the past 240 hour(s))  Urine culture     Status: None   Collection Time: 11/19/19  9:38 PM   Specimen: Urine, Random  Result Value Ref Range Status   Specimen Description   Final    URINE, RANDOM Performed at [facility], [address], [place], [place] [address]    Special Requests   Final    NONE Performed at [facility], [address], [place], [place] [address]    Culture   Final    NO GROWTH Performed at [facility], [address] [place], [place] [address]    Report Status 11/21/2019 FINAL  Final  Blood culture (routine x 2)     Status: None (Preliminary result)   Collection Time: 11/23/19 12:06 PM   Specimen: BLOOD RIGHT FOREARM  Result Value Ref Range Status   Specimen Description BLOOD RIGHT FOREARM BOTTLES DRAWN AEROBIC ONLY  Final   Special Requests   Final    Blood Culture results may not be optimal due to an inadequate volume of blood received in culture bottles   Culture   Final    NO GROWTH 2 DAYS Performed at [facility], [address] [place], [place] [address]    Report Status PENDING  Incomplete  Blood culture (routine x 2)     Status: None (Preliminary result)   Collection Time: 11/23/19 12:37 PM   Specimen: Left Antecubital; Blood  Result Value Ref Range Status   Specimen Description   Final    LEFT ANTECUBITAL BOTTLES DRAWN AEROBIC AND ANAEROBIC   Special Requests Blood Culture adequate volume  Final   Culture   Final    NO GROWTH 2 DAYS Performed at [facility], [address]  [place], [place] [address]    Report Status PENDING  Incomplete  Respiratory Panel by RT PCR (Flu A&B, Covid) - Nasopharyngeal Swab     Status: Abnormal   Collection Time: 11/23/19  1:04 PM   Specimen: Nasopharyngeal Swab  Result Value Ref Range Status   SARS Coronavirus 2 by RT PCR POSITIVE (A) NEGATIVE Final    Comment: RESULT CALLED TO, READ BACK BY AND VERIFIED WITH:  ROBIN GENTRY,RN @1649   11/23/2019 KAY (NOTE) SARS-CoV-2 target nucleic acids are DETECTED. SARS-CoV-2 RNA is generally detectable in upper respiratory specimens  during the acute phase of infection. Positive results are indicative of the presence of the identified virus, but do not rule out bacterial infection or co-infection with other pathogens not detected by the test. Clinical correlation with patient history and other diagnostic information is necessary to determine patient infection status. The expected result is Negative. Fact Sheet for Patients:  PinkCheek.be Fact Sheet for Healthcare Providers: GravelBags.it This test is not yet approved or cleared by the [place] FDA and  has been authorized for detection and/or diagnosis of SARS-CoV-2 by FDA under an Emergency Use Authorization (EUA).  This EUA will remain in effect (meaning this test can be used ) for the duration of  the COVID-19 declaration under Section 564(b)(1) of the Act, 21 U.S.C. section 360bbb-3(b)(1), unless the authorization is terminated or revoked sooner.    Influenza A by PCR NEGATIVE NEGATIVE Final   Influenza B by PCR NEGATIVE NEGATIVE Final    Comment: (NOTE) The Xpert Xpress SARS-CoV-2/FLU/RSV assay is intended as an aid in  the diagnosis of influenza from Nasopharyngeal swab specimens and  should not be used as a sole basis for treatment. Nasal washings and  aspirates are unacceptable for Xpert Xpress SARS-CoV-2/FLU/RSV  testing. Fact Sheet for Patients:  PinkCheek.be Fact Sheet for Healthcare Providers: GravelBags.it This test is not yet approved or cleared by the [place] FDA and  has been authorized for detection and/or diagnosis of SARS-CoV-2 by  FDA under an Emergency Use Authorization (EUA). This EUA will remain  in effect (meaning this test can be used) for the duration of the  Covid-19 declaration under Section 564(b)(1) of the Act, 21  U.S.C. section 360bbb-3(b)(1), unless the authorization is  terminated or revoked. Performed at [facility], [place]  [address], [place], [place] [address]      Radiology Studies: No results found.  Scheduled Meds: . amLODipine  5 mg Oral Daily  . aspirin EC  81 mg Oral Daily  . buPROPion  300 mg Oral Daily  . darifenacin  7.5 mg Oral Daily  . dexamethasone (DECADRON) injection  6 mg Intravenous Q24H  . docusate sodium  100 mg Oral BID  . donepezil  10 mg Oral Daily  . enoxaparin (LOVENOX) injection  40 mg Subcutaneous Q24H  . isosorbide mononitrate  60 mg Oral Daily  . lisinopril  10 mg Oral Daily  . memantine  10 mg Oral BID  . oxybutynin  10 mg Oral QHS  . pantoprazole  40 mg Oral Daily  . pravastatin  40 mg Oral QPM  . QUEtiapine  100 mg Oral QHS   Continuous Infusions: . cefTRIAXone (ROCEPHIN)  IV 1 g (11/24/19 1745)  . remdesivir 100 mg in NS 100 mL 100 mg (11/25/19 0954)     LOS: 2 days    Time spent: 35 minutes.   Barton Dubois, MD [facility] Pager [phone]   11/25/2019, 2:03 PM

## 2019-11-25 NOTE — Care Management Important Message (Signed)
Important Message  Patient Details  Name: Melissa Baird MRN: [id number] Date of Birth: 04/05/1941   Medicare Important Message Given:  Yes(given to Network engineer for RN to deliver to patient due to contact precautions)     Tommy Medal 11/25/2019, 3:23 PM

## 2019-11-25 NOTE — Progress Notes (Signed)
Patient has dementia and removes nasal cannula. When cannula is replaced her oxygen slowly increases back to 98 to 100 percent.

## 2019-11-26 MED ORDER — HYDRALAZINE HCL 20 MG/ML IJ SOLN
10.0000 mg | Freq: Three times a day (TID) | INTRAMUSCULAR | Status: DC | PRN
  Administered 2019-11-26: 10 mg via INTRAVENOUS
  Filled 2019-11-26: qty 1

## 2019-11-26 NOTE — TOC Initial Note (Signed)
Transition of Care [facility]) - Initial/Assessment Note    Patient Details  Name: Melissa Baird MRN: [id number] Date of Birth: March 01, 1941  Transition of Care [facility]) CM/SW Contact:    [place], LCSW Phone Number: 11/26/2019, 4:46 PM  Clinical Narrative:     CSW conducted [facility] assessment over the phone with patients daughter Leda Gauze [facility]:  [id number]. Mariann Laster reports that patient is a resident of [facility] and has been a resident there for many years.  Mariann Laster goes into detail and describes patients needs and capabilities. Mariann Laster states that pt is incontinent and needs help with activities such as feeding, toileting, bathing and dressing. Mariann Laster reports that patient is able to usually walk independently and with a walker as needed up until her admission. Per facility, patient had been experiencing increased weakness and increase in covid symptoms.   The goal is for patient to return back to [facility] once medically cleared and stable. TOC/CSW will continue to follow patient for any further discharge related needs.  [place] [facility]  Clinical Social Worker  Ph: [phone]   Expected Discharge Plan: Assisted Living Barriers to Discharge: Continued Medical Work up   Patient Goals and CMS Choice Patient states their goals for this hospitalization and ongoing recovery are:: family member states that the goal is for patient to return to [facility] once medially cleared      Expected Discharge Plan and Services Expected Discharge Plan: Assisted Living In-house Referral: Clinical Social Work Discharge Planning Services: CM Consult   Living arrangements for the past 2 months: [place]                                      Prior Living Arrangements/Services Living arrangements for the past 2 months: [place] Lives with:: Roommate Patient language and need for interpreter reviewed:: Yes Do you feel safe  going back to the place where you live?: Yes      Need for Family Participation in Patient Care: Yes (Comment) Care giver support system in place?: Yes (comment)   Criminal Activity/Legal Involvement Pertinent to Current Situation/Hospitalization: No - Comment as needed  Activities of Daily Living Home Assistive Devices/Equipment: None ADL Screening (condition at time of admission) Patient's cognitive ability adequate to safely complete daily activities?: No Is the patient deaf or have difficulty hearing?: Yes Does the patient have difficulty seeing, even when wearing glasses/contacts?: No Does the patient have difficulty concentrating, remembering, or making decisions?: Yes Patient able to express need for assistance with ADLs?: No Does the patient have difficulty dressing or bathing?: Yes Independently performs ADLs?: No Communication: Needs assistance Is this a change from baseline?: Pre-admission baseline Dressing (OT): Dependent Is this a change from baseline?: Pre-admission baseline Grooming: Dependent Is this a change from baseline?: Pre-admission baseline Feeding: Dependent Is this a change from baseline?: Pre-admission baseline Bathing: Dependent Is this a change from baseline?: Pre-admission baseline Toileting: Dependent Is this a change from baseline?: Pre-admission baseline In/Out Bed: Dependent Is this a change from baseline?: Pre-admission baseline Walks in Home: Dependent Is this a change from baseline?: Pre-admission baseline Does the patient have difficulty walking or climbing stairs?: Yes Weakness of Legs: Both Weakness of Arms/Hands: Both  Permission Sought/Granted Permission sought to share information with : Case Manager, Family Supports Permission granted to share information with : Yes, Verbal Permission Granted  Share Information with  NAME: Leda Gauze  Permission granted to share info w AGENCY: [place] granted to share info w  Relationship: Daughter  Permission granted to share info w Contact Information: Leda Gauze [phone]  Emotional Assessment     Affect (typically observed): Unable to Assess Orientation: : Fluctuating Orientation (Suspected and/or reported Sundowners) Alcohol / Substance Use: Not Applicable Psych Involvement: No (comment)  Admission diagnosis:  Respiratory failure with hypoxia ([place]) [J96.91] Acute respiratory failure with hypoxia ([place]) [J96.01] Suspected 2019 novel coronavirus infection [Z20.828] Pneumonia due to COVID-19 virus [U07.1, J12.89] Patient Active Problem List   Diagnosis Date Noted  . Respiratory failure with hypoxia ([place]) 11/23/2019  . Pneumonia due to COVID-19 virus 11/23/2019  . AKI (acute kidney injury) ([place]) 01/28/2019  . Acute metabolic encephalopathy 123456  . Dehydration 01/28/2019  . GERD (gastroesophageal reflux disease) 01/03/2019  . Acute renal failure superimposed on stage 3 chronic kidney disease ([place]) 01/03/2019  . Pressure injury of skin 01/25/2018  . Sepsis ([place]) 01/24/2018  . UTI (urinary tract infection) 01/24/2018  . Tachycardia 01/24/2018  . Influenza 12/30/2016  . Hypoxia 12/30/2016  . Loss of consciousness ([place]) 10/12/2016  . Chest pain 02/23/2016  . Dementia ([place])   . Unstable angina ([place]) 06/17/2013  . HLD (hyperlipidemia) 06/17/2013  . TOBACCO ABUSE 09/30/2010  . 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 [place] DISEASE, LUMBAR 09/14/2010  . Back pain, chronic 09/14/2010  . Depression 09/03/2010  . Essential hypertension 09/03/2010  . SYNCOPE 09/03/2010   PCP:  Lucia Gaskins, MD Pharmacy:   [facility], [address] [place] [place] [address] Phone: [phone] Fax: [phone]  [facility], [place] [address] [place] [address] Phone: [phone] Fax: [phone]     Social  Determinants of Health (SDOH) Interventions    Readmission Risk Interventions No flowsheet data found.

## 2019-11-26 NOTE — Progress Notes (Signed)
PROGRESS NOTE    Melissa Baird  [id number] DOB: [birth year] DOA: 11/23/2019 PCP: Lucia Gaskins, MD     Brief Narrative:  78 y.o. female with medical history significant of dementia, COPD, HTN who presented from long-term SNF for not eating.  Pt has dementia and could not provide hx or answer ROS questions.  Pt was send from the SNF for not eating or drinking for the past day, and was suspected of having COVID (unclear whether it was confirmed).  Per ED provider, "No report of any vomiting or diarrhea. No pain with urination or blood in the urine."  Pt was seen in the ED on 12/8 for a fall and discharged back to the facility from the ED.  ED Course: initial vitals: temp 100.7, BP 181/80, sating 76% on room air and was put on 3L O2.  Labs notable for normal WBC, normal lactic acid, neg procal, CXR showed "Coarse interstitial opacities bilaterally".  "Finding suggest acute on chronic process possibly infection superimposed on a background of scarring/chronic lung disease."  Pt received ceftriaxone, azithromycin and IV dacadron 6 mg in the ED.  positive COVID PCR.   Assessment & Plan: 1-Respiratory failure with hypoxia (HCC) -Currently afebrile -Still requiring 3-4 L nasal cannula supplementation -Continue steroids and the use of remdesivir (day 3 out of 5). -Continue supportive care and follow clinical response. -Continue as needed bronchodilators.  2-acute metabolic encephalopathy/underlying Alzheimer's dementia with depression -Given abnormal urinalysis suggesting infection and ongoing worsening mentation we will continue treatment with IV Rocephin and follow cultures. -Constant reorientation -Continue the use of donezepil, Namenda, bupropion and Seroquel  3-GERD -Continue PPI  4-hypertension -Continue amlodipine, Imdur and lisinopril.  5-acute UTI/hx of urinary retention/incontinence  -will continue vesicare and oxybutynin -tx for UTI as mentioned above;  continue Rocephin. -will treat for a total of 5 days empirically.  -No dysuria  6-DNR/DNI -Patient prognosis is guarded -Prior to admission was already no eating or drinking properly at skilled nursing facility -Will respect her wishes and treat what is treatable  7-DNR/DNI -Patient was DNR/DNI prior to admission. -Wish will be respected and granted -Having intermittent episodes of refusing oral intake here and also at the facility. -Will get palliative care involved to further clarified advance directives.   DVT prophylaxis: Lovenox Code Status: DNR/DNI Family Communication: Discharge back to skilled nursing facility once medically stable. Disposition Plan: Remains inpatient, continue IV steroids and remdesivir.  Urinalysis suggesting also complaining of UTI and will start patient on IV Rocephin.  Follow urine culture.  Follow clinical response.  Continue supportive care  Consultants:   Palliative care.  Procedures:   See below for x-ray reports.  Antimicrobials:  Anti-infectives (From admission, onward)   Start     Dose/Rate Route Frequency Ordered Stop   11/25/19 1000  remdesivir 100 mg in sodium chloride 0.9 % 100 mL IVPB     100 mg 200 mL/hr over 30 Minutes Intravenous Daily 11/24/19 0114 11/29/19 0959   11/24/19 1600  cefTRIAXone (ROCEPHIN) 1 g in sodium chloride 0.9 % 100 mL IVPB     1 g 200 mL/hr over 30 Minutes Intravenous Every 24 hours 11/24/19 1557     11/24/19 0200  remdesivir 200 mg in sodium chloride 0.9% 250 mL IVPB     200 mg 580 mL/hr over 30 Minutes Intravenous Once 11/24/19 0114 11/24/19 0308   11/23/19 1345  cefTRIAXone (ROCEPHIN) 1 g in sodium chloride 0.9 % 100 mL IVPB     1 g 200 mL/hr  over 30 Minutes Intravenous  Once 11/23/19 1332 11/23/19 1425   11/23/19 1345  azithromycin (ZITHROMAX) 500 mg in sodium chloride 0.9 % 250 mL IVPB     500 mg 250 mL/hr over 60 Minutes Intravenous  Once 11/23/19 1332 11/23/19 1453      Subjective: Patient is  afebrile, continue intermittently to refuse oral intake.  Described no chest pain, no nausea, no vomiting.  Requiring 3-4 L nasal cannula supplementation.  Objective: Vitals:   11/26/19 0900 11/26/19 1038 11/26/19 1533 11/26/19 1534  BP:   (!) 167/70   Pulse: 64  67   Resp: 18  20   Temp: 98 F (36.7 C)  98.5 F (36.9 C)   TempSrc: Axillary  Axillary   SpO2:  94% (!) 86% 94%  Weight:        Intake/Output Summary (Last 24 hours) at 11/26/2019 1557 Last data filed at 11/26/2019 1100 Gross per 24 hour  Intake 0 ml  Output --  Net 0 ml   Filed Weights   11/23/19 1138  Weight: 62 kg    Examination: General exam: Alert, awake, oriented x 1; no nausea, no vomiting, no chest pain.  Pleasantly confused.  Currently using 3-4 L nasal cannula supplementation.  In no acute distress.  Continue intermittently to refuse oral intake. Respiratory system: Positive rhonchi bilaterally, no wheezing, no using accessory muscles.   Cardiovascular system:RRR. No murmurs, rubs, gallops. Gastrointestinal system: Abdomen is nondistended, soft and nontender. No organomegaly or masses felt. Normal bowel sounds heard. Central nervous system: Alert and oriented. No focal neurological deficits. Extremities: No C/C/E Skin: No rashes, lesions or ulcers Psychiatry: Judgement and insight appear impaired due to dementia. No agitation.  Data Reviewed: I have personally reviewed following labs and imaging studies  CBC: Recent Labs  Lab 11/23/19 1205  WBC 5.7  NEUTROABS 4.2  HGB 12.6  HCT 40.7  MCV 90.8  PLT [id number]   Basic Metabolic Panel: Recent Labs  Lab 11/23/19 1205  NA 140  K 4.1  CL 103  CO2 24  GLUCOSE 92  BUN 28*  CREATININE 0.90  CALCIUM 8.6*   GFR: Estimated Creatinine Clearance: 47.1 mL/min (by C-G formula based on SCr of 0.9 mg/dL).   Liver Function Tests: Recent Labs  Lab 11/23/19 1205  AST 59*  ALT 39  ALKPHOS 53  BILITOT 0.5  PROT 6.2*  ALBUMIN 3.2*   Urine  analysis:    Component Value Date/Time   COLORURINE YELLOW 11/23/2019 1205   APPEARANCEUR HAZY (A) 11/23/2019 1205   LABSPEC 1.017 11/23/2019 1205   PHURINE 6.0 11/23/2019 1205   GLUCOSEU NEGATIVE 11/23/2019 1205   HGBUR SMALL (A) 11/23/2019 1205   BILIRUBINUR NEGATIVE 11/23/2019 [place] 11/23/2019 1205   PROTEINUR NEGATIVE 11/23/2019 1205   UROBILINOGEN 0.2 10/01/2015 1004   NITRITE POSITIVE (A) 11/23/2019 1205   LEUKOCYTESUR LARGE (A) 11/23/2019 1205    Recent Results (from the past 240 hour(s))  Urine culture     Status: None   Collection Time: 11/19/19  9:38 PM   Specimen: Urine, Random  Result Value Ref Range Status   Specimen Description   Final    URINE, RANDOM Performed at [facility], [address], [place], [place] [address]    Special Requests   Final    NONE Performed at [facility], [address] [place], [place] [address]    Culture   Final    NO GROWTH Performed at [facility], [address] [place], [place] [address]    Report Status 11/21/2019 FINAL  Final  Blood culture (routine x 2)     Status: None (Preliminary result)   Collection Time: 11/23/19 12:06 PM   Specimen: BLOOD RIGHT FOREARM  Result Value Ref Range Status   Specimen Description BLOOD RIGHT FOREARM BOTTLES DRAWN AEROBIC ONLY  Final   Special Requests   Final    Blood Culture results may not be optimal due to an inadequate volume of blood received in culture bottles   Culture   Final    NO GROWTH 3 DAYS Performed at [facility], [address], [place], [place] [address]    Report Status PENDING  Incomplete  Blood culture (routine x 2)     Status: None (Preliminary result)   Collection Time: 11/23/19 12:37 PM   Specimen: Left Antecubital; Blood  Result Value Ref Range Status   Specimen Description   Final    LEFT ANTECUBITAL BOTTLES DRAWN AEROBIC AND ANAEROBIC   Special Requests Blood Culture adequate volume  Final   Culture   Final    NO GROWTH 3  DAYS Performed at [facility], [address] [place], [place] [address]    Report Status PENDING  Incomplete  Respiratory Panel by RT PCR (Flu A&B, Covid) - Nasopharyngeal Swab     Status: Abnormal   Collection Time: 11/23/19  1:04 PM   Specimen: Nasopharyngeal Swab  Result Value Ref Range Status   SARS Coronavirus 2 by RT PCR POSITIVE (A) NEGATIVE Final    Comment: RESULT CALLED TO, READ BACK BY AND VERIFIED WITH:  ROBIN GENTRY,RN @1649   11/23/2019 [place] (NOTE) SARS-CoV-2 target nucleic acids are DETECTED. SARS-CoV-2 RNA is generally detectable in upper respiratory specimens  during the acute phase of infection. Positive results are indicative of the presence of the identified virus, but do not rule out bacterial infection or co-infection with other pathogens not detected by the test. Clinical correlation with patient history and other diagnostic information is necessary to determine patient infection status. The expected result is Negative. Fact Sheet for Patients:  PinkCheek.be Fact Sheet for Healthcare Providers: GravelBags.it This test is not yet approved or cleared by the [place] FDA and  has been authorized for detection and/or diagnosis of SARS-CoV-2 by FDA under an Emergency Use Authorization (EUA).  This EUA will remain in effect (meaning this test can be used ) for the duration of  the COVID-19 declaration under Section 564(b)(1) of the Act, 21 U.S.C. section 360bbb-3(b)(1), unless the authorization is terminated or revoked sooner.    Influenza A by PCR NEGATIVE NEGATIVE Final   Influenza B by PCR NEGATIVE NEGATIVE Final    Comment: (NOTE) The Xpert Xpress SARS-CoV-2/FLU/RSV assay is intended as an aid in  the diagnosis of influenza from Nasopharyngeal swab specimens and  should not be used as a sole basis for treatment. Nasal washings and  aspirates are unacceptable for Xpert Xpress SARS-CoV-2/FLU/RSV   testing. Fact Sheet for Patients: PinkCheek.be Fact Sheet for Healthcare Providers: GravelBags.it This test is not yet approved or cleared by the [place] FDA and  has been authorized for detection and/or diagnosis of SARS-CoV-2 by  FDA under an Emergency Use Authorization (EUA). This EUA will remain  in effect (meaning this test can be used) for the duration of the  Covid-19 declaration under Section 564(b)(1) of the Act, 21  U.S.C. section 360bbb-3(b)(1), unless the authorization is  terminated or revoked. Performed at [facility], [place]  [address] [place], [place] [address]      Radiology Studies: No results found.  Scheduled Meds: . amLODipine  5 mg Oral Daily  . aspirin EC  81 mg Oral Daily  . buPROPion  300 mg Oral Daily  . darifenacin  7.5 mg Oral Daily  . dexamethasone (DECADRON) injection  6 mg Intravenous Q24H  . docusate sodium  100 mg Oral BID  . donepezil  10 mg Oral Daily  . enoxaparin (LOVENOX) injection  40 mg Subcutaneous Q24H  . isosorbide mononitrate  60 mg Oral Daily  . lisinopril  10 mg Oral Daily  . memantine  10 mg Oral BID  . oxybutynin  10 mg Oral QHS  . pantoprazole  40 mg Oral Daily  . pravastatin  40 mg Oral QPM  . QUEtiapine  100 mg Oral QHS   Continuous Infusions: . cefTRIAXone (ROCEPHIN)  IV 1 g (11/26/19 1531)  . remdesivir 100 mg in NS 100 mL 100 mg (11/26/19 0828)     LOS: 3 days    Time spent: 35 minutes.   Barton Dubois, MD [facility] Pager [phone]   11/26/2019, 3:57 PM

## 2019-11-27 DIAGNOSIS — J9601 Acute respiratory failure with hypoxia: Secondary | ICD-10-CM

## 2019-11-27 DIAGNOSIS — Z515 Encounter for palliative care: Secondary | ICD-10-CM

## 2019-11-27 DIAGNOSIS — Z7189 Other specified counseling: Secondary | ICD-10-CM

## 2019-11-27 DIAGNOSIS — J1289 Other viral pneumonia: Secondary | ICD-10-CM

## 2019-11-27 DIAGNOSIS — U071 COVID-19: Principal | ICD-10-CM

## 2019-11-27 LAB — COMPREHENSIVE METABOLIC PANEL
ALT: 61 U/L — ABNORMAL HIGH (ref 0–44)
AST: 67 U/L — ABNORMAL HIGH (ref 15–41)
Albumin: 3.1 g/dL — ABNORMAL LOW (ref 3.5–5.0)
Alkaline Phosphatase: 64 U/L (ref 38–126)
Anion gap: 12 (ref 5–15)
BUN: 36 mg/dL — ABNORMAL HIGH (ref 8–23)
CO2: 22 mmol/L (ref 22–32)
Calcium: 8.6 mg/dL — ABNORMAL LOW (ref 8.9–10.3)
Chloride: 110 mmol/L (ref 98–111)
Creatinine, Ser: 0.8 mg/dL (ref 0.44–1.00)
GFR calc Af Amer: >60 mL/min (ref >60)
GFR calc non Af Amer: >60 mL/min (ref >60)
Glucose, Bld: 98 mg/dL (ref 70–99)
Potassium: 4 mmol/L (ref 3.5–5.1)
Sodium: 144 mmol/L (ref 135–145)
Total Bilirubin: 0.4 mg/dL (ref 0.3–1.2)
Total Protein: 6.2 g/dL — ABNORMAL LOW (ref 6.5–8.1)

## 2019-11-27 NOTE — Progress Notes (Signed)
Pt noted to have 02 via nasal canula above head each time entering room. Sats running 88-89% on room air. Replaced and sats up to 97%. Will continue to monitor.

## 2019-11-27 NOTE — Consult Note (Signed)
Consultation Note Date: 11/27/2019   Patient Name: Melissa Baird  DOB: 12-05-1941  MRN: [id number]  Age / Sex: 78 y.o., female  PCP: Lucia Gaskins, MD Referring Physician: Barton Dubois, MD  Reason for Consultation: Establishing goals of care  HPI/Patient Profile: 78 y.o. female  with past medical history of dementia, COPD, HTN admitted on 11/23/2019 with complaints of not eating. Workup revealed Covid-19 infection. Patient has continued not to eat or drink during admission. She is currently on room air, receiving remdesivir. Palliative medicine consulted for goals of care.   Clinical Assessment and Goals of Care: Reviewed patient's chart. Evaluated patient at bedside. She was found to be in fetal position, sideways in bed. Chart review shows very poor po intake during this admission- eating 0-25% of meals, little fluid intake.  I called and spoke with her daughter- Melissa Baird.  Life review conducted- patient with what Mariann Laster describes as history of "hard life"- but was feeling very well cared for at [facility]. Prior to admission- patient was ambulatory at times, dependent for all activities of living, required hand feeding. She did not feed herself, but Mariann Laster notes she would eat her whole plate full of food when she was fed. Mariann Laster has been able to bring Joycelyn Schmid to her home intermittently for weeks at a time. Most recently- she brought her to her home in November for 3 weeks.  We discussed Kareli's current illness state- although she is not requiring oxygen support, she is not eating, even when fed.  Advanced care planning and goals of care were discussed. If patient were to continue to decline food, Mariann Laster notes that she would not want artificial hydration or nutrition. We discussed the trajectory that has been common on patient's with dementia who are diagnosed with COVID. Patient's dementia is  typically worsened, poor eating and drinking make recovery very difficult.  Based on our discussion and goals of care- Mariann Laster would like patient to complete treatment for COVID and then discharge home with Hospice. She is open to taking patient home temporarily and then possibly having her go back to [facility] if she is able- but also with Hospice.  Primary Decision Maker NEXT OF KIN- daughter- Melissa Baird    SUMMARY OF RECOMMENDATIONS -Patient is DNR -Plan to complete treatment for COVID - and then d/c home with Hospice  -Transition of care referral made for hospice services at home when discharged   Code Status/Advance Care Planning:  DNR  Palliative Prophylaxis:   Delirium Protocol  Additional Recommendations (Limitations, Scope, Preferences):  Avoid Hospitalization  Prognosis:    < 6 monthsl due to advanced dementia in the setting of COVID infection- not currently eating or drinking  Discharge Planning: Home with Hospice  Primary Diagnoses: Present on Admission: . Respiratory failure with hypoxia ([place]) . Pneumonia due to COVID-19 virus   I have reviewed the medical record, interviewed the patient and family, and examined the patient. The following aspects are pertinent.  Past Medical History:  Diagnosis Date  . Alzheimer's dementia ([place])   .  Anxiety   . Arthritis   . CAD (coronary artery disease)    Moderate nonobstructive disease 2014 - Dr. Gwenlyn Found  . Chronic lower back pain   . COPD (chronic obstructive pulmonary disease) ([place])   . COVID-19   . DDD (degenerative disc disease), lumbar   . Depression   . Essential hypertension   . Frequent UTI   . GERD (gastroesophageal reflux disease)   . History of syncope   . Hyperlipidemia   . Unstable angina [facility])    Social History   Socioeconomic History  . Marital status: Divorced    Spouse name: Not on file  . Number of children: Not on file  . Years of education: Not on file  . Highest education level: Not on  file  Occupational History  . Not on file  Tobacco Use  . Smoking status: Former Smoker    Packs/day: 0.00    Years: 58.00    Pack years: 0.00    Types: Cigarettes    Quit date: 07/12/2016    Years since quitting: 3.3  . Smokeless tobacco: Never Used  Substance and Sexual Activity  . Alcohol use: No    Alcohol/week: 0.0 standard drinks  . Drug use: No  . Sexual activity: Not on file  Other Topics Concern  . Not on file  Social History Narrative  . Not on file   Social Determinants of Health   Financial Resource Strain:   . Difficulty of Paying Living Expenses: Not on file  Food Insecurity:   . Worried About Charity fundraiser in the Last Year: Not on file  . Ran Out of Food in the Last Year: Not on file  Transportation Needs:   . Lack of Transportation (Medical): Not on file  . Lack of Transportation (Non-Medical): Not on file  Physical Activity:   . Days of Exercise per Week: Not on file  . Minutes of Exercise per Session: Not on file  Stress:   . Feeling of Stress : Not on file  Social Connections:   . Frequency of Communication with Friends and Family: Not on file  . Frequency of Social Gatherings with Friends and Family: Not on file  . Attends Religious Services: Not on file  . Active Member of Clubs or Organizations: Not on file  . Attends Archivist Meetings: Not on file  . Marital Status: Not on file   Family History  Problem Relation Age of Onset  . Stroke Mother   . Dementia Mother   . Lung cancer Father        non-smoker  . Stroke Daughter   . Hypertension Daughter   . Diabetes Son    Scheduled Meds: . amLODipine  5 mg Oral Daily  . aspirin EC  81 mg Oral Daily  . buPROPion  300 mg Oral Daily  . darifenacin  7.5 mg Oral Daily  . dexamethasone (DECADRON) injection  6 mg Intravenous Q24H  . docusate sodium  100 mg Oral BID  . donepezil  10 mg Oral Daily  . enoxaparin (LOVENOX) injection  40 mg Subcutaneous Q24H  . isosorbide mononitrate   60 mg Oral Daily  . lisinopril  10 mg Oral Daily  . memantine  10 mg Oral BID  . oxybutynin  10 mg Oral QHS  . pantoprazole  40 mg Oral Daily  . pravastatin  40 mg Oral QPM  . QUEtiapine  100 mg Oral QHS   Continuous Infusions: . cefTRIAXone (ROCEPHIN)  IV 1 g (11/27/19 1556)  . remdesivir 100 mg in NS 100 mL 100 mg (11/27/19 0909)   PRN Meds:.hydrALAZINE Medications Prior to Admission:  Prior to Admission medications   Medication Sig Start Date End Date Taking? Authorizing Provider  acetaminophen (TYLENOL) 500 MG tablet Take 500 mg by mouth every 6 (six) hours as needed for mild pain or moderate pain.   Yes [provider]  alendronate (FOSAMAX) 70 MG tablet Take 70 mg by mouth every Monday. Take with a full glass of water on an empty stomach.    Yes [provider]  amLODipine (NORVASC) 5 MG tablet Take 1 tablet (5 mg total) by mouth daily. 01/29/19  Yes Kathie Dike, MD  aspirin EC 81 MG tablet Take 81 mg by mouth daily.   Yes [provider]  buPROPion (WELLBUTRIN XL) 300 MG 24 hr tablet Take 300 mg by mouth daily.   Yes [provider]  calcium citrate (CALCITRATE - DOSED IN MG ELEMENTAL CALCIUM) 950 MG tablet Take 2 tablets by mouth 2 (two) times daily.   Yes [provider]  Cholecalciferol (VITAMIN D3) 125 MCG (5000 UT) CAPS Take 1 capsule by mouth daily.   Yes [provider]  docusate sodium (COLACE) 100 MG capsule Take 100 mg by mouth 2 (two) times daily.    Yes [provider]  donepezil (ARICEPT) 10 MG tablet Take 10 mg by mouth daily.   Yes [provider]  isosorbide mononitrate (IMDUR) 30 MG 24 hr tablet Take 2 tablets (60 mg total) by mouth daily. 01/04/19  Yes Barton Dubois, MD  lisinopril (ZESTRIL) 10 MG tablet Take 10 mg by mouth daily.   Yes [provider]  memantine (NAMENDA) 10 MG tablet Take 10 mg by mouth 2 (two) times daily.   Yes [provider]  nitroGLYCERIN  (NITROSTAT) 0.4 MG SL tablet Place 1 tablet (0.4 mg total) under the tongue every 5 (five) minutes as needed for chest pain. 03/10/17  Yes Mesner, Corene Cornea, MD  oxybutynin (DITROPAN) 5 MG tablet Take 2 tablets by mouth at bedtime. 11/06/19  Yes [provider]  pantoprazole (PROTONIX) 40 MG tablet Take 40 mg by mouth daily.   Yes [provider]  pravastatin (PRAVACHOL) 40 MG tablet Take 40 mg by mouth every evening.   Yes [provider]  QUEtiapine (SEROQUEL) 100 MG tablet Take 100 mg by mouth at bedtime.   Yes [provider]  solifenacin (VESICARE) 5 MG tablet Take 5 mg by mouth daily.   Yes [provider]  vitamin B-12 (CYANOCOBALAMIN) 1000 MCG tablet Take 1 tablet (1,000 mcg total) by mouth 2 (two) times daily. 01/04/19  Yes Barton Dubois, MD   No Known Allergies Review of Systems  Unable to perform ROS: Dementia    Physical Exam Vitals and nursing note reviewed.  Constitutional:      Appearance: She is ill-appearing.  Pulmonary:     Effort: Pulmonary effort is normal.  Neurological:     Comments: Patient with mumbling, unitelligible responses to questions     Vital Signs: BP 133/69 (BP Location: Right Arm)   Pulse 93   Temp 98 F (36.7 C) (Oral)   Resp 16   Wt 62 kg   SpO2 (!) 88%   BMI 22.75 kg/m  Pain Scale: PAINAD   Pain Score: 0-No pain   SpO2: SpO2: (!) 88 % O2 Device:SpO2: (!) 88 % O2 Flow Rate: .O2 Flow Rate (L/min): 5 L/min  IO: Intake/output summary:   Intake/Output Summary (Last 24 hours) at 11/27/2019 1703 Last data filed at 11/27/2019 1500 Gross per 24 hour  Intake 540 ml  Output --  Net 540 ml    LBM:   Baseline Weight: Weight: 62 kg Most recent weight: Weight: 62 kg     Palliative Assessment/Data:PPS: 20%     Thank you for this consult. Palliative medicine will continue to follow and assist as needed.   Time In: 1615 Time Out: 1730 Time Total: 75 minutes Greater than 50%  of this time was  spent counseling and coordinating care related to the above assessment and plan.  Signed by: Mariana Kaufman, AGNP-C Palliative Medicine    Please contact Palliative Medicine Team phone at [phone] for questions and concerns.  For individual provider: See Shea Evans

## 2019-11-27 NOTE — Plan of Care (Signed)
  Problem: Education: Goal: Knowledge of General Education information will improve Description Including pain rating scale, medication(s)/side effects and non-pharmacologic comfort measures Outcome: Progressing   Problem: Health Behavior/Discharge Planning: Goal: Ability to manage health-related needs will improve Outcome: Progressing   

## 2019-11-27 NOTE — Progress Notes (Signed)
PROGRESS NOTE    ALEXIS GUILD  [id number] DOB: 03/12/1941 DOA: 11/23/2019 PCP: Lucia Gaskins, MD     Brief Narrative:  78 y.o. female with medical history significant of dementia, COPD, HTN who presented from long-term SNF for not eating.  Pt has dementia and could not provide hx or answer ROS questions.  Pt was send from the SNF for not eating or drinking for the past day, and was suspected of having COVID (unclear whether it was confirmed).  Per ED provider, "No report of any vomiting or diarrhea. No pain with urination or blood in the urine."  Pt was seen in the ED on 12/8 for a fall and discharged back to the facility from the ED.  ED Course: initial vitals: temp 100.7, BP 181/80, sating 76% on room air and was put on 3L O2.  Labs notable for normal WBC, normal lactic acid, neg procal, CXR showed "Coarse interstitial opacities bilaterally".  "Finding suggest acute on chronic process possibly infection superimposed on a background of scarring/chronic lung disease."  Pt received ceftriaxone, azithromycin and IV dacadron 6 mg in the ED.  positive COVID PCR.   Assessment & Plan: 1-Respiratory failure with hypoxia (HCC) -Currently afebrile -Still requiring 3-4 L nasal cannula supplementation -Continue steroids and the use of remdesivir (day 4 out of 5). -Continue supportive care and follow clinical response. -Continue as needed bronchodilators.  2-acute metabolic encephalopathy/underlying Alzheimer's dementia with depression -Given abnormal urinalysis suggesting infection and ongoing worsening mentation we will continue treatment with IV Rocephin and follow cultures. -Constant reorientation -Continue the use of donezepil, Namenda, bupropion and Seroquel  3-GERD -Continue PPI  4-hypertension -Continue amlodipine, Imdur and lisinopril.  5-acute UTI/hx of urinary retention/incontinence  -will continue vesicare and oxybutynin -tx for UTI as mentioned above;  continue Rocephin. -will treat for a total of 5 days empirically.  -No dysuria  6-DNR/DNI -Patient prognosis is guarded -Prior to admission was already no eating or drinking properly at skilled nursing facility -Will respect her wishes and treat what is treatable  7-DNR/DNI -Patient was DNR/DNI prior to admission. -Wish will be respected and granted -Having intermittent episodes of refusing oral intake here and also at the facility. -Will get palliative care involved to further clarified advance directives.   DVT prophylaxis: Lovenox Code Status: DNR/DNI Family Communication: Discharge back to skilled nursing facility once medically stable. Disposition Plan: Remains inpatient, continue IV steroids and remdesivir.  Urinalysis suggesting also complaining of UTI and will start patient on IV Rocephin.  Follow urine culture.  Follow clinical response.  Continue supportive care  Consultants:   Palliative care.  Procedures:   See below for x-ray reports.  Antimicrobials:  Anti-infectives (From admission, onward)   Start     Dose/Rate Route Frequency Ordered Stop   11/25/19 1000  remdesivir 100 mg in sodium chloride 0.9 % 100 mL IVPB     100 mg 200 mL/hr over 30 Minutes Intravenous Daily 11/24/19 0114 11/29/19 0959   11/24/19 1600  cefTRIAXone (ROCEPHIN) 1 g in sodium chloride 0.9 % 100 mL IVPB     1 g 200 mL/hr over 30 Minutes Intravenous Every 24 hours 11/24/19 1557     11/24/19 0200  remdesivir 200 mg in sodium chloride 0.9% 250 mL IVPB     200 mg 580 mL/hr over 30 Minutes Intravenous Once 11/24/19 0114 11/24/19 0308   11/23/19 1345  cefTRIAXone (ROCEPHIN) 1 g in sodium chloride 0.9 % 100 mL IVPB     1 g 200 mL/hr  over 30 Minutes Intravenous  Once 11/23/19 1332 11/23/19 1425   11/23/19 1345  azithromycin (ZITHROMAX) 500 mg in sodium chloride 0.9 % 250 mL IVPB     500 mg 250 mL/hr over 60 Minutes Intravenous  Once 11/23/19 1332 11/23/19 1453      Subjective: No fever,  oriented x1 and following commands.  Still desaturating on room air.  No pain.  Patient using 3-4 L nasal cannula supplementation.  Objective: Vitals:   11/26/19 1738 11/26/19 1741 11/26/19 2021 11/26/19 2120  BP: (!) 154/126 (!) 154/126  (!) 145/71  Pulse: 82   68  Resp:    20  Temp: 98.7 F (37.1 C)   98.9 F (37.2 C)  TempSrc: Oral   Oral  SpO2: 94%  (!) 89% 94%  Weight:        Intake/Output Summary (Last 24 hours) at 11/27/2019 1303 Last data filed at 11/26/2019 1900 Gross per 24 hour  Intake 420 ml  Output --  Net 420 ml   Filed Weights   11/23/19 1138  Weight: 62 kg    Examination: General exam: Alert, awake, oriented x 1; following commands and was able to take medications and some breakfast today.  Patient is afebrile and using 3-4 L nasal cannula supplementation.  Continue intermittently to take oxygen off and when that happens drifted down into the mid to high 80s O2 saturation.  Respiratory system: Positive rhonchi right, no wheezing, no crackles, no using accessory muscles.  Cardiovascular system:RRR. No murmurs, rubs, gallops. Gastrointestinal system: Abdomen is nondistended, soft and nontender. No organomegaly or masses felt. Normal bowel sounds heard. Central nervous system: Alert and oriented. No focal neurological deficits. Extremities: No cyanosis or clubbing. Skin: No rashes, no petechiae. Psychiatry: Judgement and insight impaired secondary to dementia.  Mood & affect appropriate.    Data Reviewed: I have personally reviewed following labs and imaging studies  CBC: Recent Labs  Lab 11/23/19 1205  WBC 5.7  NEUTROABS 4.2  HGB 12.6  HCT 40.7  MCV 90.8  PLT [id number]   Basic Metabolic Panel: Recent Labs  Lab 11/23/19 1205 11/27/19 0416  NA 140 144  K 4.1 4.0  CL 103 110  CO2 24 22  GLUCOSE 92 98  BUN 28* 36*  CREATININE 0.90 0.80  CALCIUM 8.6* 8.6*   GFR: Estimated Creatinine Clearance: 53 mL/min (by C-G formula based on SCr of 0.8 mg/dL).    Liver Function Tests: Recent Labs  Lab 11/23/19 1205 11/27/19 0416  AST 59* 67*  ALT 39 61*  ALKPHOS 53 64  BILITOT 0.5 0.4  PROT 6.2* 6.2*  ALBUMIN 3.2* 3.1*   Urine analysis:    Component Value Date/Time   COLORURINE YELLOW 11/23/2019 1205   APPEARANCEUR HAZY (A) 11/23/2019 1205   LABSPEC 1.017 11/23/2019 1205   PHURINE 6.0 11/23/2019 1205   GLUCOSEU NEGATIVE 11/23/2019 1205   HGBUR SMALL (A) 11/23/2019 1205   BILIRUBINUR NEGATIVE 11/23/2019 1205   KETONESUR NEGATIVE 11/23/2019 1205   PROTEINUR NEGATIVE 11/23/2019 1205   UROBILINOGEN 0.2 10/01/2015 1004   NITRITE POSITIVE (A) 11/23/2019 1205   LEUKOCYTESUR LARGE (A) 11/23/2019 1205    Recent Results (from the past 240 hour(s))  Urine culture     Status: None   Collection Time: 11/19/19  9:38 PM   Specimen: Urine, Random  Result Value Ref Range Status   Specimen Description   Final    URINE, RANDOM Performed at [facility], [address], [place], [place] [address]  Special Requests   Final    NONE Performed at [facility], [address], [place], [place] [address]    Culture   Final    NO GROWTH Performed at [facility], [place] [address] [place], [place] [address]    Report Status 11/21/2019 FINAL  Final  Blood culture (routine x 2)     Status: None (Preliminary result)   Collection Time: 11/23/19 12:06 PM   Specimen: BLOOD RIGHT FOREARM  Result Value Ref Range Status   Specimen Description BLOOD RIGHT FOREARM BOTTLES DRAWN AEROBIC ONLY  Final   Special Requests   Final    Blood Culture results may not be optimal due to an inadequate volume of blood received in culture bottles   Culture   Final    NO GROWTH 4 DAYS Performed at [facility], [address], [place], [place] [address]    Report Status PENDING  Incomplete  Blood culture (routine x 2)     Status: None (Preliminary result)   Collection Time: 11/23/19 12:37 PM   Specimen: Left Antecubital; Blood  Result Value Ref Range Status    Specimen Description   Final    LEFT ANTECUBITAL BOTTLES DRAWN AEROBIC AND ANAEROBIC   Special Requests Blood Culture adequate volume  Final   Culture   Final    NO GROWTH 4 DAYS Performed at [facility], [address], [place], [place] [address]    Report Status PENDING  Incomplete  Respiratory Panel by RT PCR (Flu A&B, Covid) - Nasopharyngeal Swab     Status: Abnormal   Collection Time: 11/23/19  1:04 PM   Specimen: Nasopharyngeal Swab  Result Value Ref Range Status   SARS Coronavirus 2 by RT PCR POSITIVE (A) NEGATIVE Final    Comment: RESULT CALLED TO, READ BACK BY AND VERIFIED WITH:  ROBIN GENTRY,RN @1649   11/23/2019 KAY (NOTE) SARS-CoV-2 target nucleic acids are DETECTED. SARS-CoV-2 RNA is generally detectable in upper respiratory specimens  during the acute phase of infection. Positive results are indicative of the presence of the identified virus, but do not rule out bacterial infection or co-infection with other pathogens not detected by the test. Clinical correlation with patient history and other diagnostic information is necessary to determine patient infection status. The expected result is Negative. Fact Sheet for Patients:  PinkCheek.be Fact Sheet for Healthcare Providers: GravelBags.it This test is not yet approved or cleared by the [place] FDA and  has been authorized for detection and/or diagnosis of SARS-CoV-2 by FDA under an Emergency Use Authorization (EUA).  This EUA will remain in effect (meaning this test can be used ) for the duration of  the COVID-19 declaration under Section 564(b)(1) of the Act, 21 U.S.C. section 360bbb-3(b)(1), unless the authorization is terminated or revoked sooner.    Influenza A by PCR NEGATIVE NEGATIVE Final   Influenza B by PCR NEGATIVE NEGATIVE Final    Comment: (NOTE) The Xpert Xpress SARS-CoV-2/FLU/RSV assay is intended as an aid in  the diagnosis of influenza  from Nasopharyngeal swab specimens and  should not be used as a sole basis for treatment. Nasal washings and  aspirates are unacceptable for Xpert Xpress SARS-CoV-2/FLU/RSV  testing. Fact Sheet for Patients: PinkCheek.be Fact Sheet for Healthcare Providers: GravelBags.it This test is not yet approved or cleared by the [place] FDA and  has been authorized for detection and/or diagnosis of SARS-CoV-2 by  FDA under an Emergency Use Authorization (EUA). This EUA will remain  in effect (meaning  this test can be used) for the duration of the  Covid-19 declaration under Section 564(b)(1) of the Act, 21  U.S.C. section 360bbb-3(b)(1), unless the authorization is  terminated or revoked. Performed at [facility], [address] [place], [place] [address]      Radiology Studies: No results found.  Scheduled Meds: . amLODipine  5 mg Oral Daily  . aspirin EC  81 mg Oral Daily  . buPROPion  300 mg Oral Daily  . darifenacin  7.5 mg Oral Daily  . dexamethasone (DECADRON) injection  6 mg Intravenous Q24H  . docusate sodium  100 mg Oral BID  . donepezil  10 mg Oral Daily  . enoxaparin (LOVENOX) injection  40 mg Subcutaneous Q24H  . isosorbide mononitrate  60 mg Oral Daily  . lisinopril  10 mg Oral Daily  . memantine  10 mg Oral BID  . oxybutynin  10 mg Oral QHS  . pantoprazole  40 mg Oral Daily  . pravastatin  40 mg Oral QPM  . QUEtiapine  100 mg Oral QHS   Continuous Infusions: . cefTRIAXone (ROCEPHIN)  IV Stopped (11/26/19 1602)  . remdesivir 100 mg in NS 100 mL 100 mg (11/27/19 0909)     LOS: 4 days    Time spent: 30 minutes.   Barton Dubois, MD [facility] Pager [phone]   11/27/2019, 1:03 PM

## 2019-11-27 NOTE — Care Management Important Message (Signed)
Important Message  Patient Details  Name: Melissa Baird MRN: [id number] Date of Birth: 1941/05/27   Medicare Important Message Given:  Yes(letter placed on chart, Larene Beach, RN will deliver to patient due to contact precautions)     Tommy Medal 11/27/2019, 3:33 PM

## 2019-11-28 LAB — COMPREHENSIVE METABOLIC PANEL
ALT: 64 U/L — ABNORMAL HIGH (ref 0–44)
AST: 62 U/L — ABNORMAL HIGH (ref 15–41)
Albumin: 3 g/dL — ABNORMAL LOW (ref 3.5–5.0)
Alkaline Phosphatase: 62 U/L (ref 38–126)
Anion gap: 11 (ref 5–15)
BUN: 42 mg/dL — ABNORMAL HIGH (ref 8–23)
CO2: 22 mmol/L (ref 22–32)
Calcium: 8.5 mg/dL — ABNORMAL LOW (ref 8.9–10.3)
Chloride: 113 mmol/L — ABNORMAL HIGH (ref 98–111)
Creatinine, Ser: 0.88 mg/dL (ref 0.44–1.00)
GFR calc Af Amer: >60 mL/min (ref >60)
GFR calc non Af Amer: >60 mL/min (ref >60)
Glucose, Bld: 89 mg/dL (ref 70–99)
Potassium: 3.8 mmol/L (ref 3.5–5.1)
Sodium: 146 mmol/L — ABNORMAL HIGH (ref 135–145)
Total Bilirubin: 0.6 mg/dL (ref 0.3–1.2)
Total Protein: 6.2 g/dL — ABNORMAL LOW (ref 6.5–8.1)

## 2019-11-28 LAB — CULTURE, BLOOD (ROUTINE X 2)
Culture: NO GROWTH
Culture: NO GROWTH
Special Requests: ADEQUATE

## 2019-11-28 NOTE — TOC Initial Note (Signed)
Transition of Care [facility]) - Initial/Assessment Note    Patient Details  Name: Melissa Baird MRN: [id number] Date of Birth: 07/01/1941  Transition of Care [facility]) CM/SW Contact:    Boneta Lucks, RN Phone Number: 11/28/2019, 11:45 AM  Clinical Narrative:    Patient was admitted for Respiratory failure with hypoxia, patient from [place]. Palliative consult is recommending hospice.  Daughter - Mariann Laster wants to take her home with hospice. Choices given. She plans to take her home with no DME's or oxygen needs and may take back to [place] in a week. Spoke with Tammy at [employer], she agrees to take her back, just needing the [facility], MD agreed to sign. Cassandra with Hospice took the referral and understands the situation. Patient will discharge home tomorrow.  All parties updated. Mariann Laster agrees to pick patient up at discharge.               Expected Discharge Plan: Home w Hospice Care Barriers to Discharge: Continued Medical Work up   Patient Goals and CMS Choice Patient states their goals for this hospitalization and ongoing recovery are:: Daughter wants to take patient home with hospice. CMS Medicare.gov Compare Post Acute Care list provided to:: Patient Represenative (must comment) Choice offered to / list presented to : Adult Children  Expected Discharge Plan and Services Expected Discharge Plan: Home w Hospice Care In-house Referral: Clinical Social Work Discharge Planning Services: CM Consult   Living arrangements for the past 2 months: [place]                   Prior Living Arrangements/Services Living arrangements for the past 2 months: [place] Lives with:: Domestic Partner Patient language and need for interpreter reviewed:: Yes Do you feel safe going back to the place where you live?: Yes      Need for Family Participation in Patient Care: Yes (Comment) Care giver support system in place?: Yes (comment)   Criminal Activity/Legal  Involvement Pertinent to Current Situation/Hospitalization: No - Comment as needed  Activities of Daily Living Home Assistive Devices/Equipment: None ADL Screening (condition at time of admission) Patient's cognitive ability adequate to safely complete daily activities?: No Is the patient deaf or have difficulty hearing?: Yes Does the patient have difficulty seeing, even when wearing glasses/contacts?: No Does the patient have difficulty concentrating, remembering, or making decisions?: Yes Patient able to express need for assistance with ADLs?: No Does the patient have difficulty dressing or bathing?: Yes Independently performs ADLs?: No Communication: Needs assistance Is this a change from baseline?: Pre-admission baseline Dressing (OT): Dependent Is this a change from baseline?: Pre-admission baseline Grooming: Dependent Is this a change from baseline?: Pre-admission baseline Feeding: Dependent Is this a change from baseline?: Pre-admission baseline Bathing: Dependent Is this a change from baseline?: Pre-admission baseline Toileting: Dependent Is this a change from baseline?: Pre-admission baseline In/Out Bed: Dependent Is this a change from baseline?: Pre-admission baseline Walks in Home: Dependent Is this a change from baseline?: Pre-admission baseline Does the patient have difficulty walking or climbing stairs?: Yes Weakness of Legs: Both Weakness of Arms/Hands: Both  Permission Sought/Granted Permission sought to share information with : Case Manager Permission granted to share information with : Yes, Verbal Permission Granted  Share Information with NAME: Leda Gauze  Permission granted to share info w AGENCY: [facility]  Permission granted to share info w Relationship: Daughter  Permission granted to share info w Contact Information: Leda Gauze [phone]  Emotional Assessment  Affect (typically observed): Unable to Assess Orientation: : Oriented to  Self Alcohol / Substance Use: Not Applicable Psych Involvement: No (comment)  Admission diagnosis:  Respiratory failure with hypoxia (HCC) [J96.91] Acute respiratory failure with hypoxia ([place]) [J96.01] Suspected 2019 novel coronavirus infection [Z20.828] Pneumonia due to COVID-19 virus [U07.1, J12.89] Patient Active Problem List   Diagnosis Date Noted  . Respiratory failure with hypoxia ([place]) 11/23/2019  . Pneumonia due to COVID-19 virus 11/23/2019  . AKI (acute kidney injury) ([place]) 01/28/2019  . Acute metabolic encephalopathy 123456  . Dehydration 01/28/2019  . GERD (gastroesophageal reflux disease) 01/03/2019  . Acute renal failure superimposed on stage 3 chronic kidney disease ([place]) 01/03/2019  . Pressure injury of skin 01/25/2018  . Sepsis ([place]) 01/24/2018  . UTI (urinary tract infection) 01/24/2018  . Tachycardia 01/24/2018  . Influenza 12/30/2016  . Hypoxia 12/30/2016  . Loss of consciousness ([place]) 10/12/2016  . Chest pain 02/23/2016  . Dementia ([place])   . Unstable angina ([place]) 06/17/2013  . HLD (hyperlipidemia) 06/17/2013  . TOBACCO ABUSE 09/30/2010  . 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 [place] DISEASE, LUMBAR 09/14/2010  . Back pain, chronic 09/14/2010  . Depression 09/03/2010  . Essential hypertension 09/03/2010  . SYNCOPE 09/03/2010   PCP:  Lucia Gaskins, MD Pharmacy:   [facility] [address] [place] [address] Phone: [phone] Fax: [phone]  [facility], [place] [address] [place] [address] Phone: [phone] Fax: [phone]     Social Determinants of Health ([place]) Interventions    Readmission Risk Interventions Readmission Risk Prevention Plan 11/28/2019  Transportation Screening Complete  PCP or Specialist Appt within 3-5 Days Not Complete  HRI or [place] Complete  Social Work  Consult for [place] Planning/Counseling Complete  Palliative Care Screening Complete  Medication Review Press photographer) Complete  Some recent data might be hidden

## 2019-11-28 NOTE — Progress Notes (Signed)
Hospice Choices:   [facility] [phone] Ownership For-Profit 2. [facility] [phone] Ownership For-Profit 3. [facility] [phone] Ownership Non-Profit 4. [facility] [phone] Ownership Non-Profit 5. [facility] [phone] Ownership Non-Profit 6. [facility] [phone] Ownership Non-Profit 7. [facility] [phone] 

## 2019-11-28 NOTE — NC FL2 (Signed)
[place] LEVEL OF CARE SCREENING TOOL     IDENTIFICATION  Patient Name: Melissa Baird Birthdate: Dec 19, 1940 Sex: female Admission Date (Current Location): 11/23/2019  [facility] and [place] Number:  [employer] and Address:  [facility] [address] [place]      Provider Number: [id number]  Attending Physician Name and Address:  Barton Dubois, MD  Relative Name and Phone Number:  Leda Gauze    Current Level of Care: Hospital Recommended Level of Care: [place] Prior Approval Number:    Date Approved/Denied:   PASRR Number:    Discharge Plan: Home    Current Diagnoses: Patient Active Problem List   Diagnosis Date Noted  . Respiratory failure with hypoxia ([place]) 11/23/2019  . Pneumonia due to COVID-19 virus 11/23/2019  . AKI (acute kidney injury) ([place]) 01/28/2019  . Acute metabolic encephalopathy 123456  . Dehydration 01/28/2019  . GERD (gastroesophageal reflux disease) 01/03/2019  . Acute renal failure superimposed on stage 3 chronic kidney disease ([place]) 01/03/2019  . Pressure injury of skin 01/25/2018  . Sepsis ([place]) 01/24/2018  . UTI (urinary tract infection) 01/24/2018  . Tachycardia 01/24/2018  . Influenza 12/30/2016  . Hypoxia 12/30/2016  . Loss of consciousness ([place]) 10/12/2016  . Chest pain 02/23/2016  . Dementia ([place])   . Unstable angina ([place]) 06/17/2013  . HLD (hyperlipidemia) 06/17/2013  . TOBACCO ABUSE 09/30/2010  . 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 [place] DISEASE, LUMBAR 09/14/2010  . Back pain, chronic 09/14/2010  . Depression 09/03/2010  . Essential hypertension 09/03/2010  . SYNCOPE 09/03/2010    Orientation RESPIRATION BLADDER Height & Weight     Self  Normal Incontinent Weight: 62 kg Height:     BEHAVIORAL SYMPTOMS/MOOD NEUROLOGICAL BOWEL NUTRITION STATUS      Incontinent Diet(Regular ( not added table salt))  AMBULATORY STATUS COMMUNICATION OF  NEEDS Skin   Extensive Assist Verbally Normal                       Personal Care Assistance Level of Assistance  Bathing, Feeding, Dressing Bathing Assistance: Maximum assistance Feeding assistance: Maximum assistance Dressing Assistance: Maximum assistance     Functional Limitations Info  Sight, Speech, Hearing Sight Info: Impaired Hearing Info: Adequate Speech Info: Adequate    SPECIAL CARE FACTORS FREQUENCY                       Contractures Contractures Info: Not present    Additional Factors Info  Code Status, Allergies Code Status Info: DNR Allergies Info: NKDA           Current Medications (11/28/2019):  This is the current hospital active medication list Current Facility-Administered Medications  Medication Dose Route Frequency Provider Last Rate Last Admin  . amLODipine (NORVASC) tablet 5 mg  5 mg Oral Daily Enzo Bi, MD   5 mg at 11/28/19 0904  . aspirin EC tablet 81 mg  81 mg Oral Daily Enzo Bi, MD   81 mg at 11/28/19 [id number]  . buPROPion (WELLBUTRIN XL) 24 hr tablet 300 mg  300 mg Oral Daily Enzo Bi, MD   300 mg at 11/28/19 0904  . darifenacin (ENABLEX) 24 hr tablet 7.5 mg  7.5 mg Oral Daily Enzo Bi, MD   7.5 mg at 11/28/19 0904  . dexamethasone (DECADRON) injection 6 mg  6 mg Intravenous Q24H Barton Dubois, MD   6 mg at 11/27/19 1327  .  docusate sodium (COLACE) capsule 100 mg  100 mg Oral BID Enzo Bi, MD   100 mg at 11/28/19 0904  . donepezil (ARICEPT) tablet 10 mg  10 mg Oral Daily Enzo Bi, MD   10 mg at 11/28/19 0904  . enoxaparin (LOVENOX) injection 40 mg  40 mg Subcutaneous Q24H Enzo Bi, MD   40 mg at 11/28/19 0904  . hydrALAZINE (APRESOLINE) injection 10 mg  10 mg Intravenous Q8H PRN Barton Dubois, MD   10 mg at 11/26/19 1800  . isosorbide mononitrate (IMDUR) 24 hr tablet 60 mg  60 mg Oral Daily Enzo Bi, MD   60 mg at 11/28/19 0904  . lisinopril (ZESTRIL) tablet 10 mg  10 mg Oral Daily Enzo Bi, MD   10 mg at 11/28/19 0904  .  memantine (NAMENDA) tablet 10 mg  10 mg Oral BID Enzo Bi, MD   10 mg at 11/28/19 0904  . oxybutynin (DITROPAN) tablet 10 mg  10 mg Oral QHS Enzo Bi, MD   10 mg at 11/27/19 2140  . pantoprazole (PROTONIX) EC tablet 40 mg  40 mg Oral Daily Enzo Bi, MD   40 mg at 11/28/19 0904  . pravastatin (PRAVACHOL) tablet 40 mg  40 mg Oral QPM Enzo Bi, MD   40 mg at 11/27/19 1732  . QUEtiapine (SEROQUEL) tablet 100 mg  100 mg Oral QHS Enzo Bi, MD   100 mg at 11/27/19 2140     Discharge Medications: Please see discharge summary for a list of discharge medications.  Relevant Imaging Results:  Relevant Lab Results:   Additional Information SS# [id number]  Boneta Lucks, RN

## 2019-11-28 NOTE — Progress Notes (Signed)
PROGRESS NOTE    TRYNA CROW  [id number] DOB: November 16, 1941 DOA: 11/23/2019 PCP: Lucia Gaskins, MD     Brief Narrative:  78 y.o. female with medical history significant of dementia, COPD, HTN who presented from long-term SNF for not eating.  Pt has dementia and could not provide hx or answer ROS questions.  Pt was send from the SNF for not eating or drinking for the past day, and was suspected of having COVID (unclear whether it was confirmed).  Per ED provider, "No report of any vomiting or diarrhea. No pain with urination or blood in the urine."  Pt was seen in the ED on 12/8 for a fall and discharged back to the facility from the ED.  ED Course: initial vitals: temp 100.7, BP 181/80, sating 76% on room air and was put on 3L O2.  Labs notable for normal WBC, normal lactic acid, neg procal, CXR showed "Coarse interstitial opacities bilaterally".  "Finding suggest acute on chronic process possibly infection superimposed on a background of scarring/chronic lung disease."  Pt received ceftriaxone, azithromycin and IV dacadron 6 mg in the ED.  positive COVID PCR.   Assessment & Plan: 1-Respiratory failure with hypoxia (HCC) -Currently afebrile -Still requiring 3-4 L nasal cannula supplementation -Continue steroids and the use of remdesivir (day 5 out of 5, to be completed on 11/28/2019). -Continue supportive care and follow clinical response. -Continue as needed bronchodilators. -Patient at discharge to pursuit steroid tapering and to follow up with hospice.   2-acute metabolic encephalopathy/underlying Alzheimer's dementia with depression -Given abnormal urinalysis suggesting infection and ongoing worsening mentation; will complete treatment for UTI as mentioned below. -Continue constant reorientation -Continue the use of donezepil, Namenda, bupropion and Seroquel  3-GERD -Continue PPI  4-hypertension -Continue amlodipine, Imdur and lisinopril. -Stable  overall.  5-acute UTI/hx of urinary retention/incontinence  -will continue vesicare and oxybutynin -tx for UTI as mentioned above; continue Rocephin. -will treat for a total of 5 days empirically (patient will complete treatment today 11/28/2019).  -No dysuria  6-DNR/DNI -Patient was DNR/DNI prior to admission. -Wish will be respected and granted -Having intermittent episodes of refusing oral intake here and also at the facility. -Palliative care was consulted and after discussion with family members decision has been made to keep patient DNR/DNI, no artificial feedings and at discharge patient will go home with hospice to hopefully further stabilize her situation before returning to assisted living facility. -Continue to treat the treatable; complete treatment with remdesivir and antibiotics as per family decision.   DVT prophylaxis: Lovenox Code Status: DNR/DNI Family Communication: Discharge back to skilled nursing facility once medically stable. Disposition Plan: Remains inpatient, continue IV steroids and remdesivir.  Urinalysis suggesting also presence of UTI .  Will complete treatment with remdesivir, steroids and antibiotics.  Hopefully discharge home tomorrow 11/29/2019 with hospice follow-up.  Follow urine culture.  Follow clinical response.  Continue supportive care  Consultants:   Palliative care.  Procedures:   See below for x-ray reports.  Antimicrobials:  Anti-infectives (From admission, onward)   Start     Dose/Rate Route Frequency Ordered Stop   11/25/19 1000  remdesivir 100 mg in sodium chloride 0.9 % 100 mL IVPB     100 mg 200 mL/hr over 30 Minutes Intravenous Daily 11/24/19 0114 11/28/19 1021   11/24/19 1600  cefTRIAXone (ROCEPHIN) 1 g in sodium chloride 0.9 % 100 mL IVPB  Status:  Discontinued     1 g 200 mL/hr over 30 Minutes Intravenous Every 24 hours 11/24/19  1557 11/28/19 0957   11/24/19 0200  remdesivir 200 mg in sodium chloride 0.9% 250 mL IVPB      200 mg 580 mL/hr over 30 Minutes Intravenous Once 11/24/19 0114 11/24/19 0308   11/23/19 1345  cefTRIAXone (ROCEPHIN) 1 g in sodium chloride 0.9 % 100 mL IVPB     1 g 200 mL/hr over 30 Minutes Intravenous  Once 11/23/19 1332 11/23/19 1425   11/23/19 1345  azithromycin (ZITHROMAX) 500 mg in sodium chloride 0.9 % 250 mL IVPB     500 mg 250 mL/hr over 60 Minutes Intravenous  Once 11/23/19 1332 11/23/19 1453      Subjective: No fever, oriented x1; no acute distress.  Still mild shortness of breath with exertion reported.  Using 4 L nasal cannula supplementation.  No chest pain, no nausea, no vomiting.  Objective: Vitals:   11/27/19 1505 11/27/19 2009 11/27/19 2110 11/28/19 0528  BP:   (!) 149/75 128/69  Pulse:   80 74  Resp:   17 19  Temp:   97.8 F (36.6 C) 98.3 F (36.8 C)  TempSrc:    Oral  SpO2: (!) 88% 92% 94% 92%  Weight:        Intake/Output Summary (Last 24 hours) at 11/28/2019 1211 Last data filed at 11/27/2019 1700 Gross per 24 hour  Intake 0 ml  Output --  Net 0 ml   Filed Weights   11/23/19 1138  Weight: 62 kg    Examination: General exam: Alert, awake, oriented x 1; using 4 L nasal cannula supplementation; in no acute distress.  No major agitation.  Mild shortness of breath on exertion. Respiratory system: Positive rhonchi right, no wheezing, no crackles.  No using accessory muscles.  Cardiovascular system:RRR. No murmurs, rubs, gallops. Gastrointestinal system: Abdomen is nondistended, soft and nontender. No organomegaly or masses felt. Normal bowel sounds heard. Central nervous system: Moving 4 limbs spontaneously; no focal neurological deficits appreciated. Extremities: No cyanosis or clubbing. Skin: No rashes, no petechiae; no open wounds. Psychiatry: Mood & affect appropriate.   Data Reviewed: I have personally reviewed following labs and imaging studies  CBC: Recent Labs  Lab 11/23/19 1205  WBC 5.7  NEUTROABS 4.2  HGB 12.6  HCT 40.7  MCV 90.8   PLT [id number]   Basic Metabolic Panel: Recent Labs  Lab 11/23/19 1205 11/27/19 0416 11/28/19 0514  NA 140 144 146*  K 4.1 4.0 3.8  CL 103 110 113*  CO2 24 22 22   GLUCOSE 92 98 89  BUN 28* 36* 42*  CREATININE 0.90 0.80 0.88  CALCIUM 8.6* 8.6* 8.5*   GFR: Estimated Creatinine Clearance: 48.2 mL/min (by C-G formula based on SCr of 0.88 mg/dL).   Liver Function Tests: Recent Labs  Lab 11/23/19 1205 11/27/19 0416 11/28/19 0514  AST 59* 67* 62*  ALT 39 61* 64*  ALKPHOS 53 64 62  BILITOT 0.5 0.4 0.6  PROT 6.2* 6.2* 6.2*  ALBUMIN 3.2* 3.1* 3.0*   Urine analysis:    Component Value Date/Time   COLORURINE YELLOW 11/23/2019 1205   APPEARANCEUR HAZY (A) 11/23/2019 1205   LABSPEC 1.017 11/23/2019 1205   PHURINE 6.0 11/23/2019 1205   GLUCOSEU NEGATIVE 11/23/2019 1205   HGBUR SMALL (A) 11/23/2019 1205   BILIRUBINUR NEGATIVE 11/23/2019 1205   [place] 11/23/2019 1205   PROTEINUR NEGATIVE 11/23/2019 1205   UROBILINOGEN 0.2 10/01/2015 1004   NITRITE POSITIVE (A) 11/23/2019 1205   LEUKOCYTESUR LARGE (A) 11/23/2019 1205    Recent Results (from the  past 240 hour(s))  Urine culture     Status: None   Collection Time: 11/19/19  9:38 PM   Specimen: Urine, Random  Result Value Ref Range Status   Specimen Description   Final    URINE, RANDOM Performed at [facility], [address] [place], [place] [address]    Special Requests   Final    NONE Performed at [facility], [address] [place], [place] [address]    Culture   Final    NO GROWTH Performed at [facility], [place] [address] [place], [place] [address]    Report Status 11/21/2019 FINAL  Final  Blood culture (routine x 2)     Status: None   Collection Time: 11/23/19 12:06 PM   Specimen: BLOOD RIGHT FOREARM  Result Value Ref Range Status   Specimen Description BLOOD RIGHT FOREARM BOTTLES DRAWN AEROBIC ONLY  Final   Special Requests   Final    Blood Culture results may not be optimal due to an  inadequate volume of blood received in culture bottles   Culture   Final    NO GROWTH 5 DAYS Performed at [facility], [address], [place], [place] [address]    Report Status 11/28/2019 FINAL  Final  Blood culture (routine x 2)     Status: None   Collection Time: 11/23/19 12:37 PM   Specimen: Left Antecubital; Blood  Result Value Ref Range Status   Specimen Description   Final    LEFT ANTECUBITAL BOTTLES DRAWN AEROBIC AND ANAEROBIC   Special Requests Blood Culture adequate volume  Final   Culture   Final    NO GROWTH 5 DAYS Performed at [facility], [address] [place], [place] [address]    Report Status 11/28/2019 FINAL  Final  Respiratory Panel by RT PCR (Flu A&B, Covid) - Nasopharyngeal Swab     Status: Abnormal   Collection Time: 11/23/19  1:04 PM   Specimen: Nasopharyngeal Swab  Result Value Ref Range Status   SARS Coronavirus 2 by RT PCR POSITIVE (A) NEGATIVE Final    Comment: RESULT CALLED TO, READ BACK BY AND VERIFIED WITH:  ROBIN GENTRY,RN @1649   11/23/2019 KAY (NOTE) SARS-CoV-2 target nucleic acids are DETECTED. SARS-CoV-2 RNA is generally detectable in upper respiratory specimens  during the acute phase of infection. Positive results are indicative of the presence of the identified virus, but do not rule out bacterial infection or co-infection with other pathogens not detected by the test. Clinical correlation with patient history and other diagnostic information is necessary to determine patient infection status. The expected result is Negative. Fact Sheet for Patients:  PinkCheek.be Fact Sheet for Healthcare Providers: GravelBags.it This test is not yet approved or cleared by the [place] FDA and  has been authorized for detection and/or diagnosis of SARS-CoV-2 by FDA under an Emergency Use Authorization (EUA).  This EUA will remain in effect (meaning this test can be used ) for the duration of   the COVID-19 declaration under Section 564(b)(1) of the Act, 21 U.S.C. section 360bbb-3(b)(1), unless the authorization is terminated or revoked sooner.    Influenza A by PCR NEGATIVE NEGATIVE Final   Influenza B by PCR NEGATIVE NEGATIVE Final    Comment: (NOTE) The Xpert Xpress SARS-CoV-2/FLU/RSV assay is intended as an aid in  the diagnosis of influenza from Nasopharyngeal swab specimens and  should not be used as a sole basis for treatment. Nasal washings and  aspirates are unacceptable for Xpert Xpress  SARS-CoV-2/FLU/RSV  testing. Fact Sheet for Patients: PinkCheek.be Fact Sheet for Healthcare Providers: GravelBags.it This test is not yet approved or cleared by the [place] FDA and  has been authorized for detection and/or diagnosis of SARS-CoV-2 by  FDA under an Emergency Use Authorization (EUA). This EUA will remain  in effect (meaning this test can be used) for the duration of the  Covid-19 declaration under Section 564(b)(1) of the Act, 21  U.S.C. section 360bbb-3(b)(1), unless the authorization is  terminated or revoked. Performed at [facility], [address] [place], [place] [address]      Radiology Studies: No results found.  Scheduled Meds: . amLODipine  5 mg Oral Daily  . aspirin EC  81 mg Oral Daily  . buPROPion  300 mg Oral Daily  . darifenacin  7.5 mg Oral Daily  . docusate sodium  100 mg Oral BID  . donepezil  10 mg Oral Daily  . enoxaparin (LOVENOX) injection  40 mg Subcutaneous Q24H  . isosorbide mononitrate  60 mg Oral Daily  . lisinopril  10 mg Oral Daily  . memantine  10 mg Oral BID  . oxybutynin  10 mg Oral QHS  . pantoprazole  40 mg Oral Daily  . pravastatin  40 mg Oral QPM  . QUEtiapine  100 mg Oral QHS   Continuous Infusions:    LOS: 5 days    Time spent: 30 minutes.   Barton Dubois, MD [facility] Pager [phone]   11/28/2019, 12:11 PM

## 2019-11-29 LAB — COMPREHENSIVE METABOLIC PANEL
ALT: 61 U/L — ABNORMAL HIGH (ref 0–44)
AST: 51 U/L — ABNORMAL HIGH (ref 15–41)
Albumin: 3.2 g/dL — ABNORMAL LOW (ref 3.5–5.0)
Alkaline Phosphatase: 65 U/L (ref 38–126)
Anion gap: 6 (ref 5–15)
BUN: 40 mg/dL — ABNORMAL HIGH (ref 8–23)
CO2: 24 mmol/L (ref 22–32)
Calcium: 8.3 mg/dL — ABNORMAL LOW (ref 8.9–10.3)
Chloride: 117 mmol/L — ABNORMAL HIGH (ref 98–111)
Creatinine, Ser: 0.83 mg/dL (ref 0.44–1.00)
GFR calc Af Amer: >60 mL/min (ref >60)
GFR calc non Af Amer: >60 mL/min (ref >60)
Glucose, Bld: 86 mg/dL (ref 70–99)
Potassium: 3.6 mmol/L (ref 3.5–5.1)
Sodium: 147 mmol/L — ABNORMAL HIGH (ref 135–145)
Total Bilirubin: 0.9 mg/dL (ref 0.3–1.2)
Total Protein: 6.4 g/dL — ABNORMAL LOW (ref 6.5–8.1)

## 2019-11-29 MED ORDER — PREDNISONE 20 MG PO TABS: 40.0000 mg | ORAL_TABLET | Freq: Every day | ORAL | 0 refills | Status: AC

## 2019-11-29 MED ORDER — MORPHINE SULFATE 20 MG/5ML PO SOLN: 2.5000 mg | ORAL | 0 refills | Status: AC | PRN

## 2019-11-29 NOTE — Discharge Summary (Addendum)
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 BLOOD RIGHT FOREARM BOTTLES DRAWN AEROBIC ONLY  Final   Special Requests   Final    Blood Culture results may not be optimal due to an inadequate volume of blood received in culture bottles   Culture   Final    NO GROWTH 5 DAYS Performed at [facility], [address] [place], [place] [address]    Report Status 11/28/2019 FINAL  Final  Blood culture (routine x 2)     Status: None   Collection Time: 11/23/19 12:37 PM   Specimen: Left Antecubital; Blood  Result Value Ref Range Status   Specimen Description   Final    LEFT ANTECUBITAL BOTTLES DRAWN AEROBIC AND ANAEROBIC   Special Requests Blood Culture adequate volume  Final   Culture   Final    NO  GROWTH 5 DAYS Performed at [facility], [address] [place], [place] [address]    Report Status 11/28/2019 FINAL  Final  Respiratory Panel by RT PCR (Flu A&B, Covid) - Nasopharyngeal  Swab     Status: Abnormal   Collection Time: 11/23/19  1:04 PM   Specimen: Nasopharyngeal Swab  Result Value Ref Range Status   SARS Coronavirus 2 by RT PCR POSITIVE (A) NEGATIVE Final    Comment: RESULT CALLED TO, READ BACK BY AND VERIFIED WITH:  ROBIN GENTRY,RN @1649   11/23/2019 KAY (NOTE) SARS-CoV-2 target nucleic acids are DETECTED. SARS-CoV-2 RNA is generally detectable in upper respiratory specimens  during the acute phase of infection. Positive results are indicative of the presence of the identified virus, but do not rule out bacterial infection or co-infection with other pathogens not detected by the test. Clinical correlation with patient history and other diagnostic information is necessary to determine patient infection status. The expected result is Negative. Fact Sheet for Patients:  PinkCheek.be Fact Sheet for Healthcare Providers: GravelBags.it This test is not yet approved or cleared by the [place] FDA and  has been authorized for detection and/or diagnosis of SARS-CoV-2 by FDA under an Emergency Use Authorization (EUA).  This EUA will remain in effect (meaning this test can be used ) for the duration of  the COVID-19 declaration under Section 564(b)(1) of the Act, 21 U.S.C. section 360bbb-3(b)(1), unless the authorization is terminated or revoked sooner.    Influenza A by PCR NEGATIVE NEGATIVE Final   Influenza B by PCR NEGATIVE NEGATIVE Final    Comment: (NOTE) The Xpert Xpress SARS-CoV-2/FLU/RSV assay is intended as an aid in  the diagnosis of influenza from Nasopharyngeal swab specimens and  should not be used as a sole basis for treatment. Nasal washings and  aspirates are unacceptable for Xpert Xpress  SARS-CoV-2/FLU/RSV  testing. Fact Sheet for Patients: PinkCheek.be Fact Sheet for Healthcare Providers: GravelBags.it This test is not yet approved or cleared by the [place] FDA and  has been authorized for detection and/or diagnosis of SARS-CoV-2 by  FDA under an Emergency Use Authorization (EUA). This EUA will remain  in effect (meaning this test can be used) for the duration of the  Covid-19 declaration under Section 564(b)(1) of the Act, 21  U.S.C. section 360bbb-3(b)(1), unless the authorization is  terminated or revoked. Performed at [facility], [address], [place], [place] [address]      Time coordinating discharge: 35 minutes  SIGNED:   Rodena Goldmann, DO [facility] 11/29/2019, 10:44 AM  If 7PM-7AM, please contact night-coverage [email or web address]

## 2019-11-29 NOTE — TOC Transition Note (Signed)
Transition of Care [facility]) - CM/SW Discharge Note   Patient Details  Name: Melissa Baird MRN: [id number] Date of Birth: [birth year]  Transition of Care [facility]) CM/SW Contact:  Boneta Lucks, RN Phone Number: 11/29/2019, 11:03 AM   Clinical Narrative:   Patient is ready for discharge home today.  Patient going to [facility] house her daughter with Hospice.  Patient is from [place].  Per Tammy they will take her back if Mariann Laster can not handle.  FL2 done and signed, good for 30 days and sent to [place]. Mariann Laster updated. EMS called to transport patient home. Medical necessity printed and RN updated.     Final next level of care: Home w Hospice Care Barriers to Discharge: Barriers Resolved   Patient Goals and CMS Choice Patient states their goals for this hospitalization and ongoing recovery are:: Daughter wants to take patient home with hospice. CMS Medicare.gov Compare Post Acute Care list provided to:: Patient Represenative (must comment) Choice offered to / list presented to : Adult Children  Discharge Placement              Patient chooses bed at: (Home with Hospice) Patient to be transferred to facility by: EMS Name of family member notified: Mariann Laster Patient and family notified of of transfer: 11/29/19  Discharge Plan and Services In-house Referral: Clinical Social Work Discharge Planning Services: CM Consult    Readmission Risk Interventions Readmission Risk Prevention Plan 11/28/2019  Transportation Screening Complete  PCP or Specialist Appt within 3-5 Days Not Complete  HRI or Home Care Consult Complete  Social Work Consult for Independence Planning/Counseling Complete  Palliative Care Screening Complete  Medication Review Press photographer) Complete  Some recent data might be hidden

## 2019-11-29 NOTE — NC FL2 (Signed)
[place] LEVEL OF CARE SCREENING TOOL     IDENTIFICATION  Patient Name: Melissa Baird Birthdate: 07/17/1941 Sex: female Admission Date (Current Location): 11/23/2019  [facility] [place] Number:  [employer] and Address:  [place] [address] [place]      Provider Number: [id number]  Attending Physician Name and Address:  Rodena Goldmann, DO  Relative Name and Phone Number:  Leda Gauze    Current Level of Care: Hospital Recommended Level of Care: [place] Prior Approval Number:    Date Approved/Denied:   PASRR Number:    Discharge Plan: Home    Current Diagnoses: Patient Active Problem List   Diagnosis Date Noted  . Respiratory failure with hypoxia ([place]) 11/23/2019  . Pneumonia due to COVID-19 virus 11/23/2019  . AKI (acute kidney injury) ([place]) 01/28/2019  . Acute metabolic encephalopathy [id number]  . Dehydration 01/28/2019  . GERD (gastroesophageal reflux disease) 01/03/2019  . Acute renal failure superimposed on stage 3 chronic kidney disease ([place]) 01/03/2019  . Pressure injury of skin 01/25/2018  . Sepsis ([place]) 01/24/2018  . UTI (urinary tract infection) 01/24/2018  . Tachycardia 01/24/2018  . Influenza 12/30/2016  . Hypoxia 12/30/2016  . Loss of consciousness ([place]) 10/12/2016  . Chest pain 02/23/2016  . Dementia ([place])   . Unstable angina ([place]) 06/17/2013  . HLD (hyperlipidemia) 06/17/2013  . TOBACCO ABUSE 09/30/2010  . 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 [place] DISEASE, LUMBAR 09/14/2010  . Back pain, chronic 09/14/2010  . Depression 09/03/2010  . Essential hypertension 09/03/2010  . SYNCOPE 09/03/2010    Orientation RESPIRATION BLADDER Height & Weight     Self  Normal Incontinent Weight: 62 kg Height:     BEHAVIORAL SYMPTOMS/MOOD NEUROLOGICAL BOWEL NUTRITION STATUS      Incontinent Diet(Regular ( not added table salt))  AMBULATORY STATUS COMMUNICATION OF  NEEDS Skin   Extensive Assist Verbally Normal                       Personal Care Assistance Level of Assistance  Bathing, Feeding, Dressing Bathing Assistance: Maximum assistance Feeding assistance: Maximum assistance Dressing Assistance: Maximum assistance     Functional Limitations Info  Sight, Speech, Hearing Sight Info: Impaired Hearing Info: Adequate Speech Info: Adequate    SPECIAL CARE FACTORS FREQUENCY                       Contractures Contractures Info: Not present    Additional Factors Info  Code Status, Allergies Code Status Info: DNR Allergies Info: NKDA           Current Medications (11/29/2019):  This is the current hospital active medication list Current Facility-Administered Medications  Medication Dose Route Frequency Provider Last Rate Last Admin  . amLODipine (NORVASC) tablet 5 mg  5 mg Oral Daily Enzo Bi, MD   5 mg at 11/28/19 0904  . aspirin EC tablet 81 mg  81 mg Oral Daily Enzo Bi, MD   81 mg at 11/28/19 [id number]  . buPROPion (WELLBUTRIN XL) 24 hr tablet 300 mg  300 mg Oral Daily Enzo Bi, MD   300 mg at 11/28/19 0904  . darifenacin (ENABLEX) 24 hr tablet 7.5 mg  7.5 mg Oral Daily Enzo Bi, MD   7.5 mg at 11/28/19 0904  . docusate sodium (COLACE) capsule 100 mg  100 mg Oral BID Enzo Bi, MD   100 mg at  11/28/19 2213  . donepezil (ARICEPT) tablet 10 mg  10 mg Oral Daily Enzo Bi, MD   10 mg at 11/28/19 0904  . enoxaparin (LOVENOX) injection 40 mg  40 mg Subcutaneous Q24H Enzo Bi, MD   40 mg at 11/29/19 0840  . hydrALAZINE (APRESOLINE) injection 10 mg  10 mg Intravenous Q8H PRN Barton Dubois, MD   10 mg at 11/26/19 1800  . isosorbide mononitrate (IMDUR) 24 hr tablet 60 mg  60 mg Oral Daily Enzo Bi, MD   60 mg at 11/28/19 0904  . lisinopril (ZESTRIL) tablet 10 mg  10 mg Oral Daily Enzo Bi, MD   10 mg at 11/28/19 0904  . memantine (NAMENDA) tablet 10 mg  10 mg Oral BID Enzo Bi, MD   10 mg at 11/28/19 2214  . oxybutynin  (DITROPAN) tablet 10 mg  10 mg Oral QHS Enzo Bi, MD   10 mg at 11/28/19 2213  . pantoprazole (PROTONIX) EC tablet 40 mg  40 mg Oral Daily Enzo Bi, MD   40 mg at 11/28/19 0904  . pravastatin (PRAVACHOL) tablet 40 mg  40 mg Oral QPM Enzo Bi, MD   40 mg at 11/28/19 1628  . QUEtiapine (SEROQUEL) tablet 100 mg  100 mg Oral QHS Enzo Bi, MD   100 mg at 11/28/19 2214     Discharge Medications:  Discharge Instructions      Discharge Instructions    Diet - low sodium heart healthy   Complete by: As directed    Increase activity slowly   Complete by: As directed      Allergies as of 11/29/2019   No Known Allergies        Medication List    STOP taking these medications   acetaminophen 500 MG tablet Commonly known as: TYLENOL   aspirin EC 81 MG tablet     TAKE these medications   alendronate 70 MG tablet Commonly known as: FOSAMAX Take 70 mg by mouth every Monday. Take with a full glass of water on an empty stomach.   amLODipine 5 MG tablet Commonly known as: NORVASC Take 1 tablet (5 mg total) by mouth daily.   buPROPion 300 MG 24 hr tablet Commonly known as: WELLBUTRIN XL Take 300 mg by mouth daily.   calcium citrate 950 (200 Ca) MG tablet Commonly known as: CALCITRATE - dosed in mg elemental calcium Take 2 tablets by mouth 2 (two) times daily.   docusate sodium 100 MG capsule Commonly known as: COLACE Take 100 mg by mouth 2 (two) times daily.   donepezil 10 MG tablet Commonly known as: ARICEPT Take 10 mg by mouth daily.   isosorbide mononitrate 30 MG 24 hr tablet Commonly known as: IMDUR Take 2 tablets (60 mg total) by mouth daily.   lisinopril 10 MG tablet Commonly known as: ZESTRIL Take 10 mg by mouth daily.   memantine 10 MG tablet Commonly known as: NAMENDA Take 10 mg by mouth 2 (two) times daily.   morphine 20 MG/5ML solution Take 0.6 mLs (2.4 mg total) by mouth every 2 (two) hours as needed for up to 3 days for pain.    nitroGLYCERIN 0.4 MG SL tablet Commonly known as: NITROSTAT Place 1 tablet (0.4 mg total) under the tongue every 5 (five) minutes as needed for chest pain.   oxybutynin 5 MG tablet Commonly known as: DITROPAN Take 2 tablets by mouth at bedtime.   pantoprazole 40 MG tablet Commonly known as: PROTONIX Take 40 mg by  mouth daily.   pravastatin 40 MG tablet Commonly known as: PRAVACHOL Take 40 mg by mouth every evening.   predniSONE 20 MG tablet Commonly known as: Deltasone Take 2 tablets (40 mg total) by mouth daily for 5 days.   QUEtiapine 100 MG tablet Commonly known as: SEROQUEL Take 100 mg by mouth at bedtime.   solifenacin 5 MG tablet Commonly known as: VESICARE Take 5 mg by mouth daily.   vitamin B-12 1000 MCG tablet Commonly known as: CYANOCOBALAMIN Take 1 tablet (1,000 mcg total) by mouth 2 (two) times daily.   Vitamin D3 125 MCG (5000 UT) Caps Take 1 capsule by mouth daily.       Relevant Imaging Results:  Relevant Lab Results:   Additional Information SS# [id number]  Boneta Lucks, RN

## 2019-11-29 NOTE — Plan of Care (Signed)

## 2019-12-02 DIAGNOSIS — Z7189 Other specified counseling: Secondary | ICD-10-CM

## 2019-12-02 DIAGNOSIS — Z515 Encounter for palliative care: Secondary | ICD-10-CM

## 2020-03-11 ENCOUNTER — Telehealth: Payer: Self-pay

## 2020-03-11 NOTE — Telephone Encounter (Signed)
  Patient Consent for Virtual Visit         Melissa Baird has provided verbal consent on 03/11/2020 for a virtual visit (video or telephone).   CONSENT FOR VIRTUAL VISIT FOR:  Melissa Baird  By participating in this virtual visit I agree to the following:  I hereby voluntarily request, consent and authorize [facility] and its employed or contracted physicians, physician assistants, nurse practitioners or other licensed health care professionals (the Practitioner), to provide me with telemedicine health care services (the "Services") as deemed necessary by the treating Practitioner. I acknowledge and consent to receive the Services by the Practitioner via telemedicine. I understand that the telemedicine visit will involve communicating with the Practitioner through live audiovisual communication technology and the disclosure of certain medical information by electronic transmission. I acknowledge that I have been given the opportunity to request an in-person assessment or other available alternative prior to the telemedicine visit and am voluntarily participating in the telemedicine visit.  I understand that I have the right to withhold or withdraw my consent to the use of telemedicine in the course of my care at any time, without affecting my right to future care or treatment, and that the Practitioner or I may terminate the telemedicine visit at any time. I understand that I have the right to inspect all information obtained and/or recorded in the course of the telemedicine visit and may receive copies of available information for a reasonable fee.  I understand that some of the potential risks of receiving the Services via telemedicine include:  Marland Kitchen Delay or interruption in medical evaluation due to technological equipment failure or disruption; . Information transmitted may not be sufficient (e.g. poor resolution of images) to allow for appropriate medical decision making by the  Practitioner; and/or  . In rare instances, security protocols could fail, causing a breach of personal health information.  Furthermore, I acknowledge that it is my responsibility to provide information about my medical history, conditions and care that is complete and accurate to the best of my ability. I acknowledge that Practitioner's advice, recommendations, and/or decision may be based on factors not within their control, such as incomplete or inaccurate data provided by me or distortions of diagnostic images or specimens that may result from electronic transmissions. I understand that the practice of medicine is not an exact science and that Practitioner makes no warranties or guarantees regarding treatment outcomes. I acknowledge that a copy of this consent can be made available to me via my patient portal ([facility]), or I can request a printed copy by calling the office of [facility].    I understand that my insurance will be billed for this visit.   I have read or had this consent read to me. . I understand the contents of this consent, which adequately explains the benefits and risks of the Services being provided via telemedicine.  . I have been provided ample opportunity to ask questions regarding this consent and the Services and have had my questions answered to my satisfaction. . I give my informed consent for the services to be provided through the use of telemedicine in my medical care

## 2020-03-12 ENCOUNTER — Encounter: Payer: Self-pay | Admitting: Cardiology

## 2020-03-12 ENCOUNTER — Telehealth (INDEPENDENT_AMBULATORY_CARE_PROVIDER_SITE_OTHER): Payer: Medicare Other | Admitting: Cardiology

## 2020-03-12 DIAGNOSIS — Z8679 Personal history of other diseases of the circulatory system: Secondary | ICD-10-CM | POA: Diagnosis not present

## 2020-03-12 DIAGNOSIS — I25119 Atherosclerotic heart disease of native coronary artery with unspecified angina pectoris: Secondary | ICD-10-CM | POA: Diagnosis not present

## 2020-03-12 DIAGNOSIS — I10 Essential (primary) hypertension: Secondary | ICD-10-CM | POA: Diagnosis not present

## 2020-03-12 NOTE — Patient Instructions (Signed)
Medication Instructions: Your physician recommends that you continue on your current medications as directed. Please refer to the Current Medication list given to you today.   Labwork: None today  Procedures/Testing: None today  Follow-Up: As needed  Any Additional Special Instructions Will Be Listed Below (If Applicable).     If you need a refill on your cardiac medications before your next appointment, please call your pharmacy.     Thank you for choosing [facility] !
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
## 2020-11-11 DEATH — deceased
